# Patient Record
Sex: Female | Born: 1970 | Race: Black or African American | Hispanic: No | Marital: Single | Smoking: Current every day smoker
Health system: Southern US, Community
[De-identification: ages and names within clinical notes are randomized; demographics above are authoritative.]

## PROBLEM LIST (undated history)

## (undated) DIAGNOSIS — F331 Major depressive disorder, recurrent, moderate: Secondary | ICD-10-CM

## (undated) DIAGNOSIS — I872 Venous insufficiency (chronic) (peripheral): Secondary | ICD-10-CM

## (undated) DIAGNOSIS — Z720 Tobacco use: Secondary | ICD-10-CM

## (undated) DIAGNOSIS — I10 Essential (primary) hypertension: Secondary | ICD-10-CM

## (undated) HISTORY — DX: Major depressive disorder, recurrent, moderate: F33.1

## (undated) HISTORY — DX: Essential (primary) hypertension: I10

## (undated) HISTORY — DX: Venous insufficiency (chronic) (peripheral): I87.2

## (undated) HISTORY — DX: Tobacco use: Z72.0

---

## 1997-08-21 ENCOUNTER — Other Ambulatory Visit: Admission: RE | Admit: 1997-08-21 | Discharge: 1997-08-21 | Payer: Self-pay | Admitting: Nephrology

## 1997-09-04 ENCOUNTER — Ambulatory Visit (HOSPITAL_COMMUNITY): Admission: RE | Admit: 1997-09-04 | Discharge: 1997-09-04 | Payer: Self-pay | Admitting: Nephrology

## 1999-05-03 ENCOUNTER — Emergency Department (HOSPITAL_COMMUNITY): Admission: EM | Admit: 1999-05-03 | Discharge: 1999-05-03 | Payer: Self-pay | Admitting: Emergency Medicine

## 1999-05-03 ENCOUNTER — Other Ambulatory Visit: Admission: RE | Admit: 1999-05-03 | Discharge: 1999-05-03 | Payer: Self-pay | Admitting: Obstetrics

## 2000-01-01 ENCOUNTER — Emergency Department (HOSPITAL_COMMUNITY): Admission: EM | Admit: 2000-01-01 | Discharge: 2000-01-01 | Payer: Self-pay | Admitting: Emergency Medicine

## 2000-01-03 ENCOUNTER — Emergency Department (HOSPITAL_COMMUNITY): Admission: EM | Admit: 2000-01-03 | Discharge: 2000-01-03 | Payer: Self-pay | Admitting: Emergency Medicine

## 2000-01-05 ENCOUNTER — Encounter: Admission: RE | Admit: 2000-01-05 | Discharge: 2000-01-05 | Payer: Self-pay | Admitting: Obstetrics & Gynecology

## 2000-08-16 ENCOUNTER — Emergency Department (HOSPITAL_COMMUNITY): Admission: EM | Admit: 2000-08-16 | Discharge: 2000-08-16 | Payer: Self-pay | Admitting: Emergency Medicine

## 2000-08-16 ENCOUNTER — Encounter: Payer: Self-pay | Admitting: Emergency Medicine

## 2001-06-29 ENCOUNTER — Emergency Department (HOSPITAL_COMMUNITY): Admission: EM | Admit: 2001-06-29 | Discharge: 2001-06-29 | Payer: Self-pay | Admitting: Emergency Medicine

## 2002-06-26 ENCOUNTER — Emergency Department (HOSPITAL_COMMUNITY): Admission: EM | Admit: 2002-06-26 | Discharge: 2002-06-26 | Payer: Self-pay | Admitting: Emergency Medicine

## 2002-06-26 ENCOUNTER — Encounter: Payer: Self-pay | Admitting: Emergency Medicine

## 2004-01-08 ENCOUNTER — Emergency Department (HOSPITAL_COMMUNITY): Admission: EM | Admit: 2004-01-08 | Discharge: 2004-01-08 | Payer: Self-pay | Admitting: Family Medicine

## 2004-01-12 ENCOUNTER — Ambulatory Visit: Payer: Self-pay | Admitting: Internal Medicine

## 2004-02-16 ENCOUNTER — Emergency Department (HOSPITAL_COMMUNITY): Admission: EM | Admit: 2004-02-16 | Discharge: 2004-02-17 | Payer: Self-pay | Admitting: Emergency Medicine

## 2004-03-09 ENCOUNTER — Ambulatory Visit: Payer: Self-pay | Admitting: Internal Medicine

## 2004-04-21 ENCOUNTER — Ambulatory Visit: Payer: Self-pay | Admitting: Internal Medicine

## 2004-05-25 ENCOUNTER — Ambulatory Visit: Payer: Self-pay | Admitting: Internal Medicine

## 2004-06-18 ENCOUNTER — Ambulatory Visit: Payer: Self-pay | Admitting: Internal Medicine

## 2004-07-22 ENCOUNTER — Ambulatory Visit: Payer: Self-pay | Admitting: Internal Medicine

## 2004-08-13 ENCOUNTER — Ambulatory Visit: Payer: Self-pay | Admitting: Internal Medicine

## 2004-10-20 ENCOUNTER — Ambulatory Visit: Payer: Self-pay | Admitting: Internal Medicine

## 2005-03-10 ENCOUNTER — Ambulatory Visit: Payer: Self-pay | Admitting: Internal Medicine

## 2005-05-10 ENCOUNTER — Emergency Department (HOSPITAL_COMMUNITY): Admission: EM | Admit: 2005-05-10 | Discharge: 2005-05-10 | Payer: Self-pay | Admitting: Emergency Medicine

## 2005-05-10 IMAGING — CR DG CHEST 2V
2 series · 2 of 2 positions shown · non-contrast
Comparison: [DATE].

CLINICAL DATA: Body aches and hypertension.
CHEST - 2 VIEW:

[w chest pa]
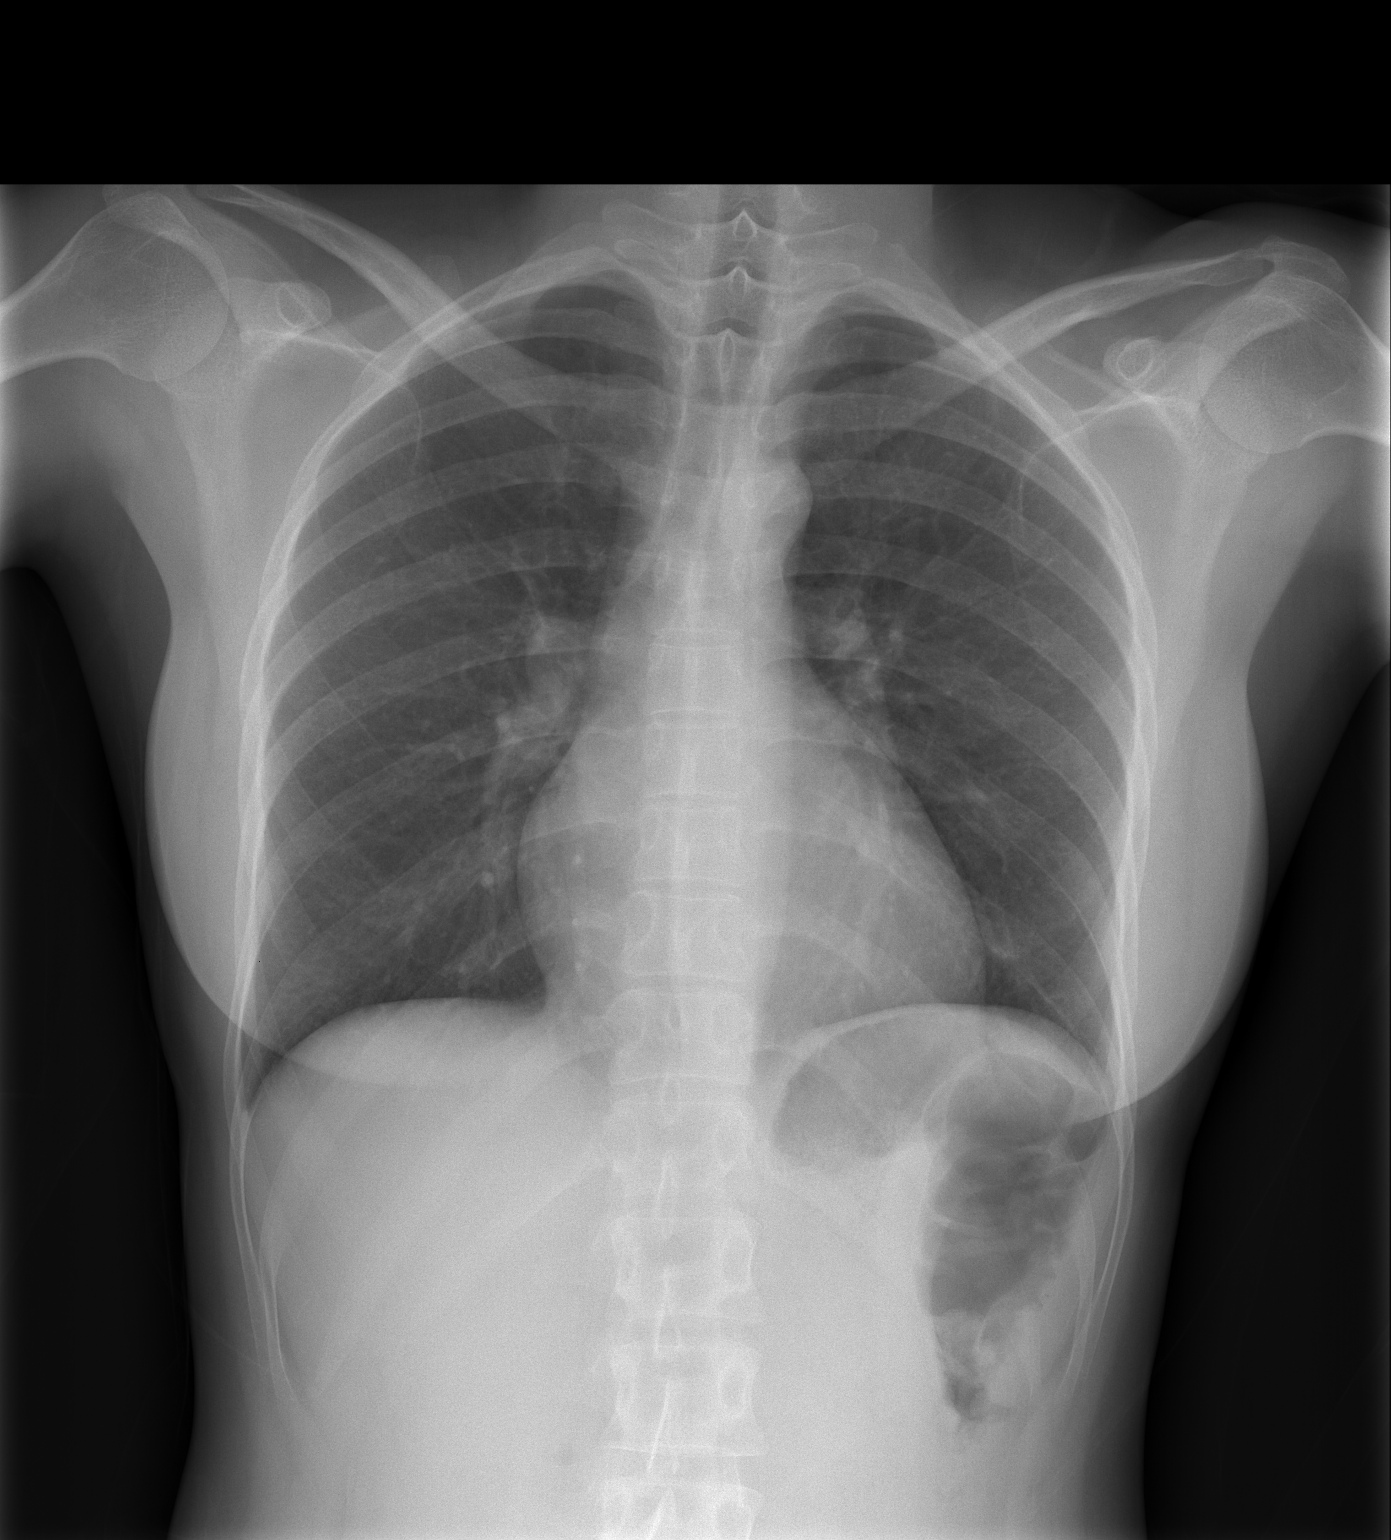

[w chest lat]
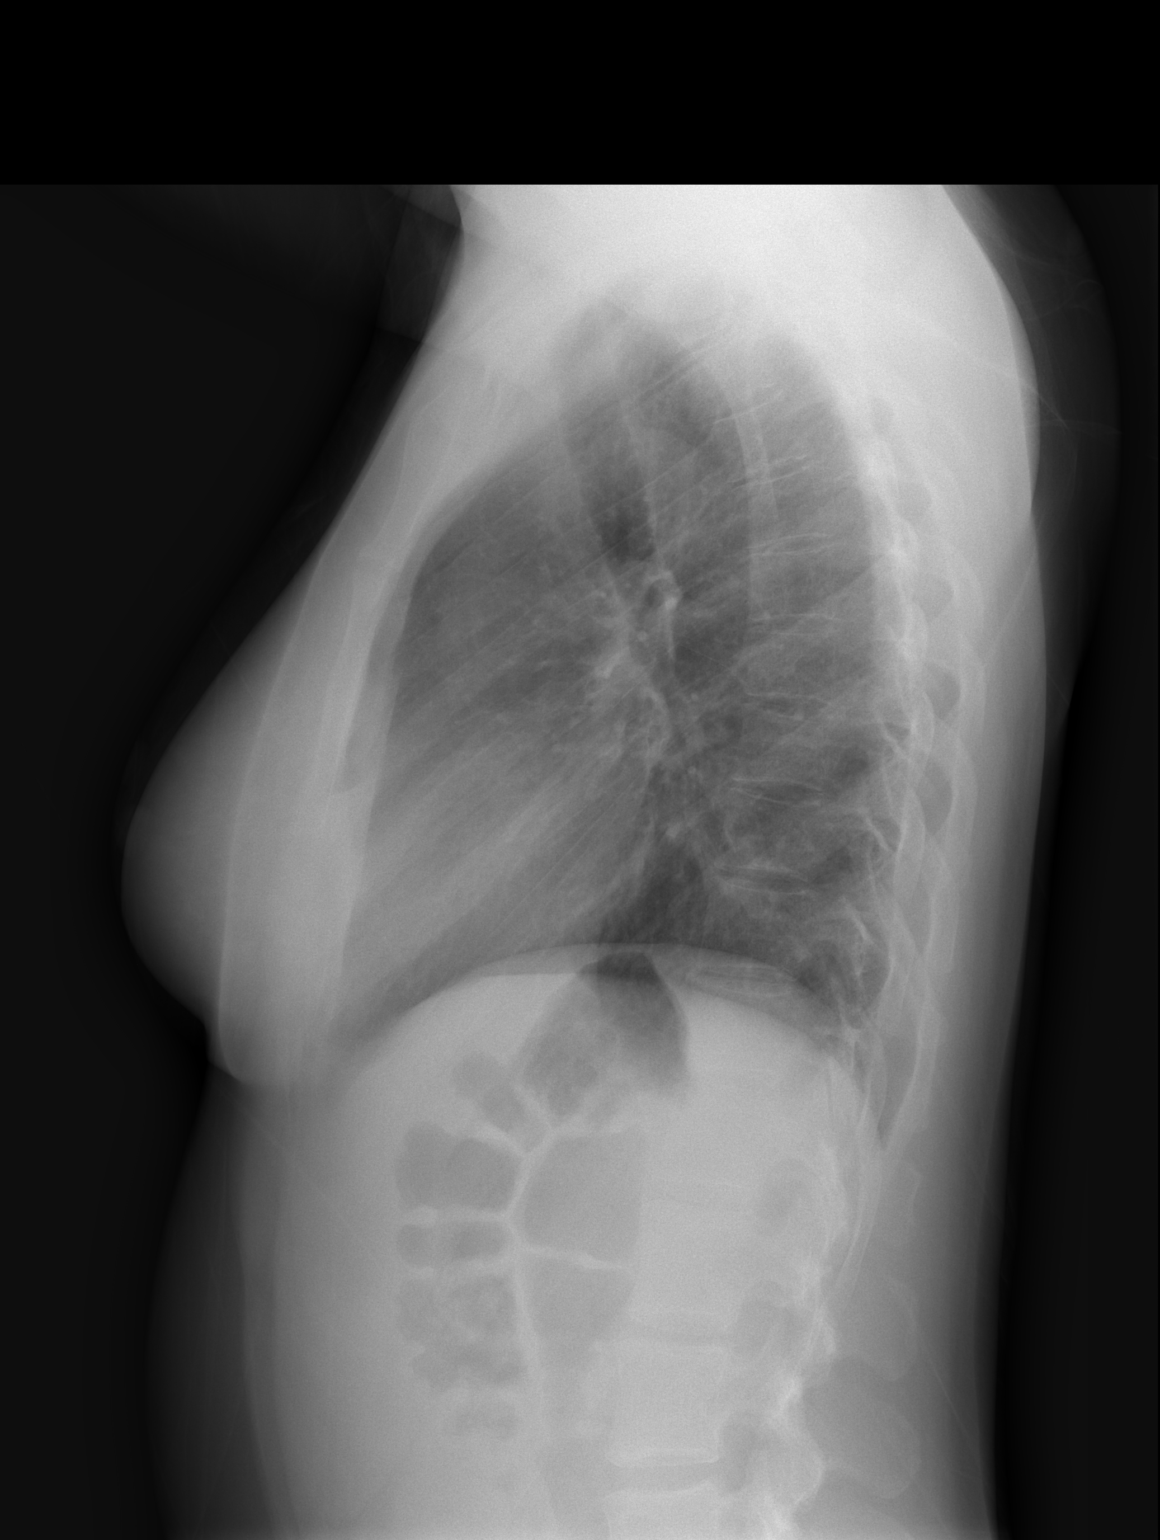

[2 of 2 positions shown; findings below may reference images not displayed]

FINDINGS: lightly prominent appearance of the right hilum unchanged and probably vascular in origin.  No infiltrate or congestive heart failure.  Heart size top normal.
IMPRESSION: 1. No infiltrate or congestive heart failure.
2. Top normal size heart.
3. Prominent appearance right hilum, stable.

## 2006-03-14 DIAGNOSIS — F331 Major depressive disorder, recurrent, moderate: Secondary | ICD-10-CM | POA: Insufficient documentation

## 2006-03-14 DIAGNOSIS — G4700 Insomnia, unspecified: Secondary | ICD-10-CM | POA: Insufficient documentation

## 2006-03-14 DIAGNOSIS — I10 Essential (primary) hypertension: Secondary | ICD-10-CM

## 2006-03-14 DIAGNOSIS — Z720 Tobacco use: Secondary | ICD-10-CM

## 2006-03-14 HISTORY — DX: Tobacco use: Z72.0

## 2006-03-14 HISTORY — DX: Major depressive disorder, recurrent, moderate: F33.1

## 2006-03-14 HISTORY — DX: Essential (primary) hypertension: I10

## 2006-03-21 DIAGNOSIS — K219 Gastro-esophageal reflux disease without esophagitis: Secondary | ICD-10-CM | POA: Insufficient documentation

## 2006-07-13 ENCOUNTER — Encounter (INDEPENDENT_AMBULATORY_CARE_PROVIDER_SITE_OTHER): Payer: Self-pay | Admitting: Internal Medicine

## 2006-07-13 ENCOUNTER — Ambulatory Visit: Payer: Self-pay | Admitting: Internal Medicine

## 2006-07-13 DIAGNOSIS — R300 Dysuria: Secondary | ICD-10-CM | POA: Insufficient documentation

## 2006-07-13 LAB — CONVERTED CEMR LAB
Amphetamine Screen, Ur: NEGATIVE
Barbiturate Quant, Ur: NEGATIVE
Benzodiazepines.: NEGATIVE
Bilirubin Urine: NEGATIVE
Cocaine Metabolites: NEGATIVE
Creatinine,U: 41.3 mg/dL
Hemoglobin, Urine: NEGATIVE
Ketones, ur: NEGATIVE mg/dL
Leukocytes, UA: NEGATIVE
Marijuana Metabolite: NEGATIVE
Methadone: NEGATIVE
Nitrite: NEGATIVE
Opiates: NEGATIVE
Phencyclidine (PCP): NEGATIVE
Propoxyphene: NEGATIVE
Protein, ur: NEGATIVE mg/dL
Specific Gravity, Urine: 1.008 (ref 1.005–1.03)
Urine Glucose: NEGATIVE mg/dL
Urobilinogen, UA: 0.2 (ref 0.0–1.0)
pH: 6 (ref 5.0–8.0)

## 2007-06-26 ENCOUNTER — Encounter: Payer: Self-pay | Admitting: Internal Medicine

## 2007-06-26 ENCOUNTER — Ambulatory Visit: Payer: Self-pay | Admitting: Internal Medicine

## 2007-06-29 LAB — CONVERTED CEMR LAB
ALT: 8 units/L (ref 0–35)
ANA Titer 1: 1:40 {titer} — ABNORMAL HIGH
AST: 16 units/L (ref 0–37)
Albumin: 4.5 g/dL (ref 3.5–5.2)
Alkaline Phosphatase: 64 units/L (ref 39–117)
Anti Nuclear Antibody(ANA): POSITIVE — AB
BUN: 9 mg/dL (ref 6–23)
Basophils Absolute: 0 10*3/uL (ref 0.0–0.1)
Basophils Relative: 0 % (ref 0–1)
CO2: 22 meq/L (ref 19–32)
Calcium: 9.4 mg/dL (ref 8.4–10.5)
Chloride: 108 meq/L (ref 96–112)
Creatinine, Ser: 0.76 mg/dL (ref 0.40–1.20)
Eosinophils Absolute: 0.2 10*3/uL (ref 0.0–0.7)
Eosinophils Relative: 2 % (ref 0–5)
Glucose, Bld: 89 mg/dL (ref 70–99)
HCT: 43.2 % (ref 36.0–46.0)
Hemoglobin: 14 g/dL (ref 12.0–15.0)
Lymphocytes Relative: 33 % (ref 12–46)
Lymphs Abs: 3.6 10*3/uL (ref 0.7–4.0)
MCHC: 32.4 g/dL (ref 30.0–36.0)
MCV: 79.1 fL (ref 78.0–100.0)
Monocytes Absolute: 0.9 10*3/uL (ref 0.1–1.0)
Monocytes Relative: 9 % (ref 3–12)
Neutro Abs: 6.1 10*3/uL (ref 1.7–7.7)
Neutrophils Relative %: 56 % (ref 43–77)
Platelets: 301 10*3/uL (ref 150–400)
Potassium: 4 meq/L (ref 3.5–5.3)
RBC: 5.46 M/uL — ABNORMAL HIGH (ref 3.87–5.11)
RDW: 14 % (ref 11.5–15.5)
Sed Rate: 2 mm/hr (ref 0–22)
Sodium: 141 meq/L (ref 135–145)
TSH: 0.649 microintl units/mL (ref 0.350–5.50)
Total Bilirubin: 0.3 mg/dL (ref 0.3–1.2)
Total Protein: 7.8 g/dL (ref 6.0–8.3)
WBC: 10.9 10*3/uL — ABNORMAL HIGH (ref 4.0–10.5)

## 2007-07-03 ENCOUNTER — Encounter: Payer: Self-pay | Admitting: Internal Medicine

## 2007-07-03 ENCOUNTER — Ambulatory Visit (HOSPITAL_COMMUNITY): Admission: RE | Admit: 2007-07-03 | Discharge: 2007-07-03 | Payer: Self-pay | Admitting: Hospitalist

## 2007-07-03 ENCOUNTER — Ambulatory Visit: Payer: Self-pay | Admitting: Hospitalist

## 2007-07-03 DIAGNOSIS — I498 Other specified cardiac arrhythmias: Secondary | ICD-10-CM | POA: Insufficient documentation

## 2007-07-03 DIAGNOSIS — H1030 Unspecified acute conjunctivitis, unspecified eye: Secondary | ICD-10-CM | POA: Insufficient documentation

## 2007-07-04 LAB — CONVERTED CEMR LAB
ENA SM Ab Ser-aCnc: 0.2 (ref <1.0)
ds DNA Ab: <1 (ref <5)

## 2007-08-09 ENCOUNTER — Ambulatory Visit: Payer: Self-pay | Admitting: *Deleted

## 2007-08-09 ENCOUNTER — Ambulatory Visit (HOSPITAL_COMMUNITY): Admission: RE | Admit: 2007-08-09 | Discharge: 2007-08-09 | Payer: Self-pay | Admitting: *Deleted

## 2007-08-09 DIAGNOSIS — R0602 Shortness of breath: Secondary | ICD-10-CM | POA: Insufficient documentation

## 2007-08-09 IMAGING — CR DG CHEST 2V
2 series · 2 of 2 positions shown · non-contrast
Comparison: [DATE] study

CLINICAL DATA: Tachycardia.  Dyspnea on exertion.  Hypertension.
History of tobacco smoking.

CHEST - 2 VIEW

[w chest pa]
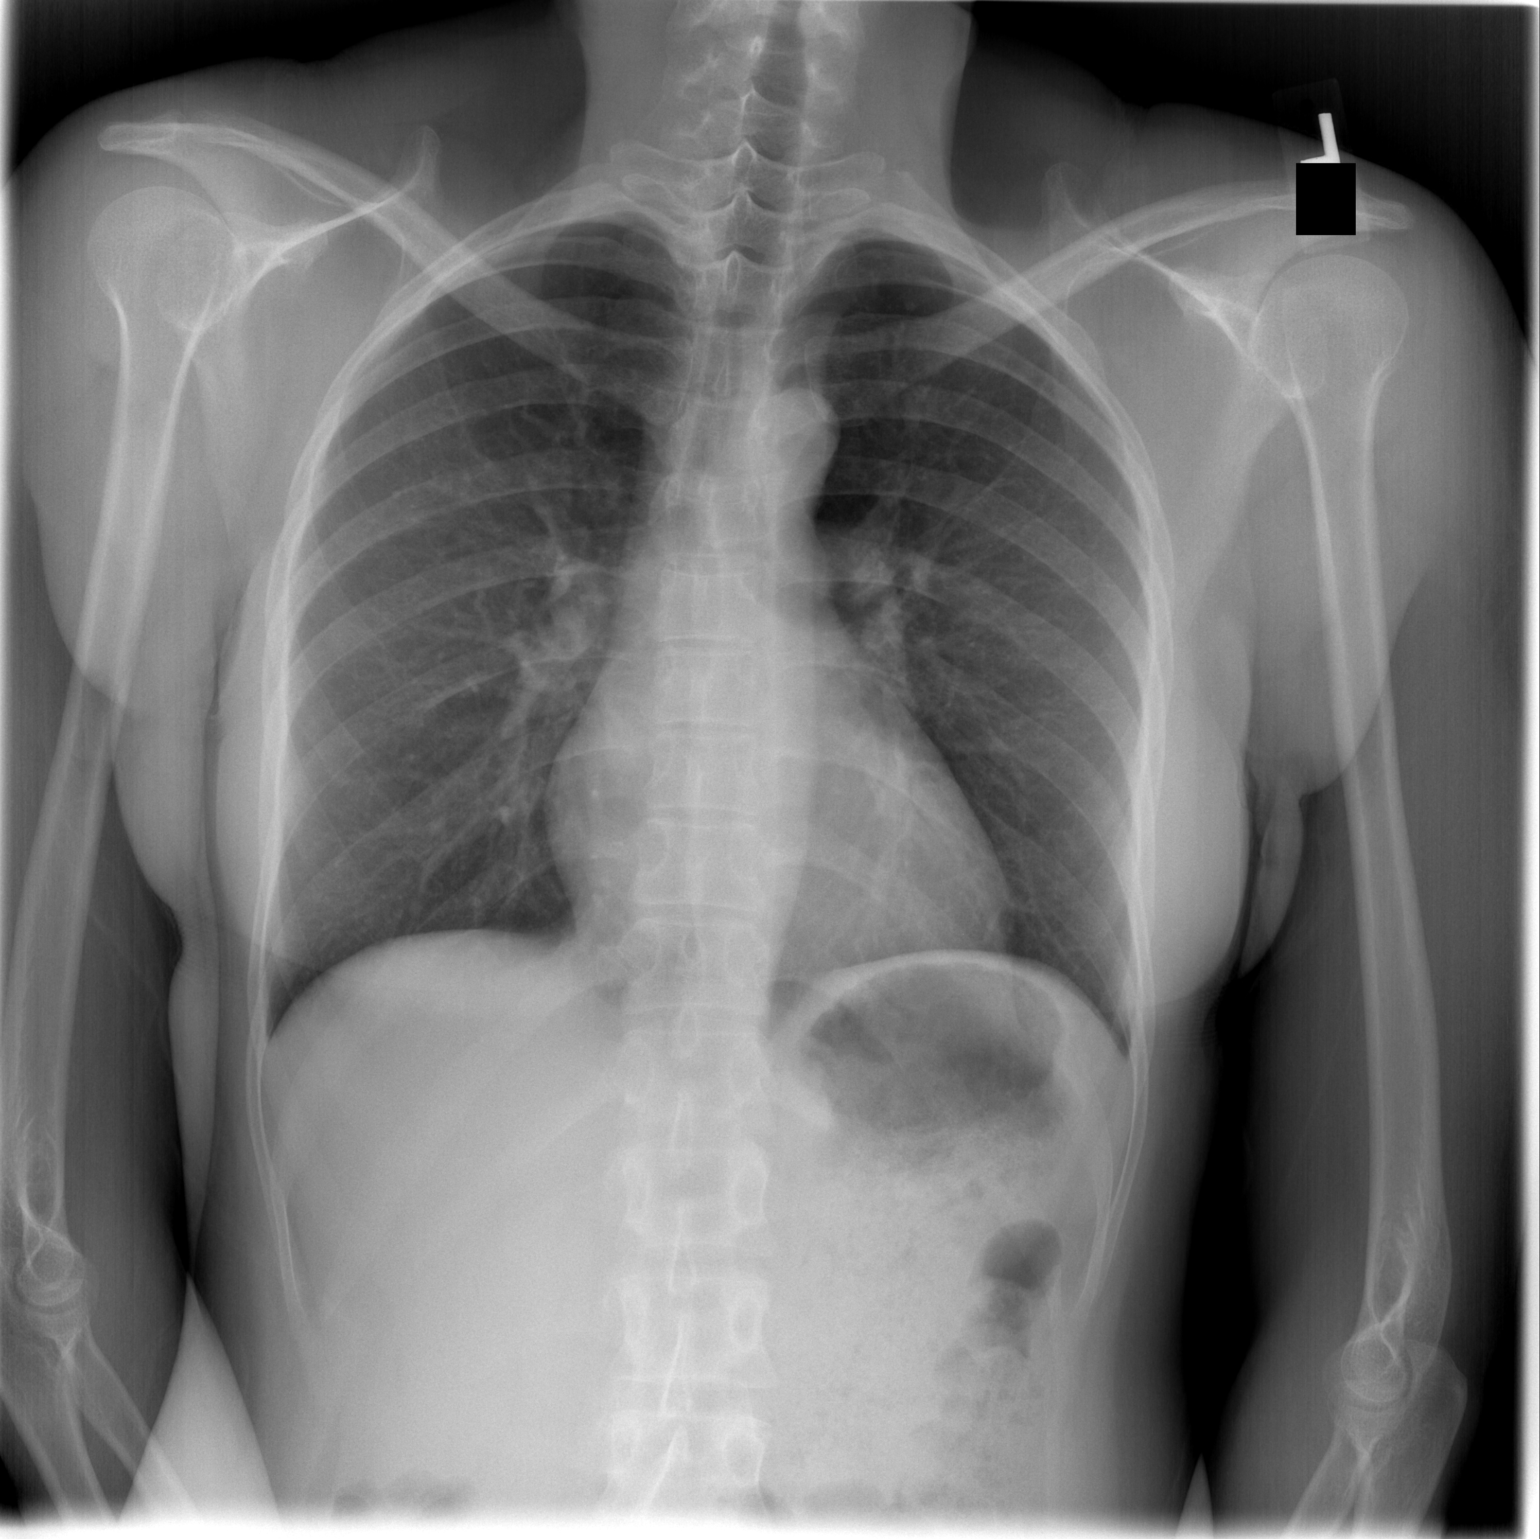

[w chest lat]
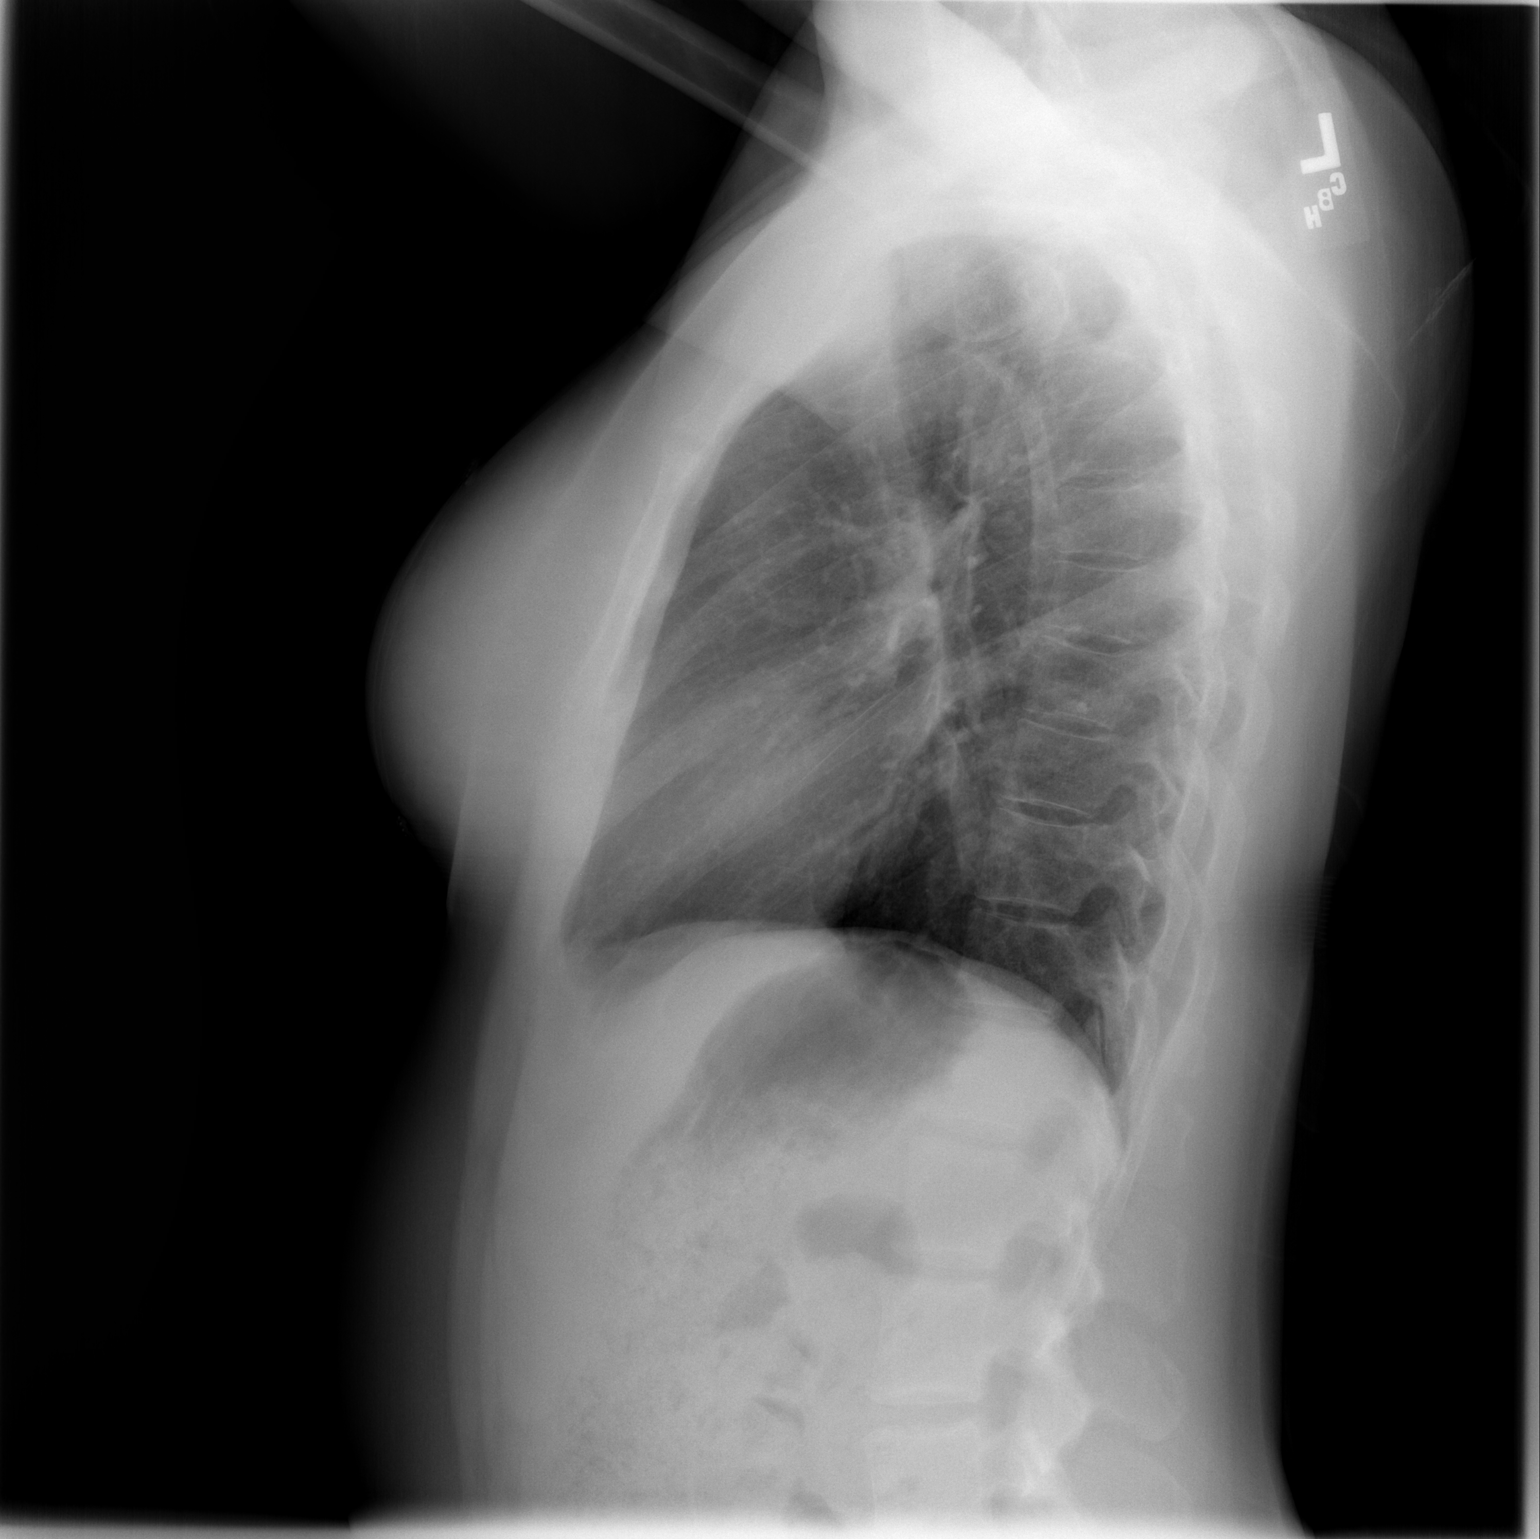

[2 of 2 positions shown; findings below may reference images not displayed]

FINDINGS: Cardiac silhouette is upper normal size.  No mediastinal
or pleural lesions are seen.  Lungs are free of infiltrates.  No
bony lesions are seen.
IMPRESSION: No acute or active process seen.

## 2007-08-15 ENCOUNTER — Ambulatory Visit (HOSPITAL_COMMUNITY): Admission: RE | Admit: 2007-08-15 | Discharge: 2007-08-15 | Payer: Self-pay | Admitting: *Deleted

## 2007-08-15 ENCOUNTER — Encounter (INDEPENDENT_AMBULATORY_CARE_PROVIDER_SITE_OTHER): Payer: Self-pay | Admitting: *Deleted

## 2007-08-15 ENCOUNTER — Ambulatory Visit: Payer: Self-pay | Admitting: Internal Medicine

## 2007-09-19 ENCOUNTER — Ambulatory Visit: Payer: Self-pay | Admitting: *Deleted

## 2008-01-07 ENCOUNTER — Ambulatory Visit: Payer: Self-pay | Admitting: Internal Medicine

## 2008-01-07 DIAGNOSIS — K089 Disorder of teeth and supporting structures, unspecified: Secondary | ICD-10-CM | POA: Insufficient documentation

## 2008-01-08 ENCOUNTER — Encounter (INDEPENDENT_AMBULATORY_CARE_PROVIDER_SITE_OTHER): Payer: Self-pay | Admitting: *Deleted

## 2008-01-22 ENCOUNTER — Telehealth: Payer: Self-pay | Admitting: *Deleted

## 2008-01-23 ENCOUNTER — Telehealth: Payer: Self-pay | Admitting: *Deleted

## 2008-04-11 ENCOUNTER — Ambulatory Visit: Payer: Self-pay | Admitting: Internal Medicine

## 2008-04-11 ENCOUNTER — Encounter (INDEPENDENT_AMBULATORY_CARE_PROVIDER_SITE_OTHER): Payer: Self-pay | Admitting: Internal Medicine

## 2008-04-11 DIAGNOSIS — H00019 Hordeolum externum unspecified eye, unspecified eyelid: Secondary | ICD-10-CM | POA: Insufficient documentation

## 2008-04-11 LAB — CONVERTED CEMR LAB
BUN: 7 mg/dL (ref 6–23)
CO2: 21 meq/L (ref 19–32)
Calcium: 9 mg/dL (ref 8.4–10.5)
Chloride: 105 meq/L (ref 96–112)
Creatinine, Ser: 0.64 mg/dL (ref 0.40–1.20)
Glucose, Bld: 99 mg/dL (ref 70–99)
Potassium: 3.8 meq/L (ref 3.5–5.3)
Sodium: 140 meq/L (ref 135–145)

## 2008-07-27 ENCOUNTER — Emergency Department (HOSPITAL_COMMUNITY): Admission: EM | Admit: 2008-07-27 | Discharge: 2008-07-27 | Payer: Self-pay | Admitting: Emergency Medicine

## 2008-09-09 ENCOUNTER — Emergency Department (HOSPITAL_COMMUNITY): Admission: EM | Admit: 2008-09-09 | Discharge: 2008-09-09 | Payer: Self-pay | Admitting: Emergency Medicine

## 2008-11-26 ENCOUNTER — Ambulatory Visit: Payer: Self-pay | Admitting: Internal Medicine

## 2010-08-20 ENCOUNTER — Encounter: Payer: Self-pay | Admitting: Internal Medicine

## 2012-01-07 ENCOUNTER — Emergency Department (HOSPITAL_COMMUNITY)
Admission: EM | Admit: 2012-01-07 | Discharge: 2012-01-07 | Disposition: A | Discharge status: Home / Self Care | Payer: Managed Care, Other (non HMO) | Source: Home / Self Care | Attending: Emergency Medicine | Admitting: Emergency Medicine

## 2012-01-07 ENCOUNTER — Encounter (HOSPITAL_COMMUNITY): Payer: Self-pay | Admitting: Emergency Medicine

## 2012-01-07 DIAGNOSIS — S0181XA Laceration without foreign body of other part of head, initial encounter: Secondary | ICD-10-CM

## 2012-01-07 DIAGNOSIS — S0180XA Unspecified open wound of other part of head, initial encounter: Secondary | ICD-10-CM

## 2012-01-07 MED ORDER — BACITRACIN ZINC 500 UNIT/GM EX OINT: TOPICAL_OINTMENT | Freq: Two times a day (BID) | CUTANEOUS | Status: DC

## 2012-01-07 NOTE — ED Provider Notes (Addendum)
History     CSN: [id number]  Arrival date & time 01/07/12  1257   First MD Initiated Contact with Patient 01/07/12 1358      Chief Complaint  Patient presents with  . Facial Laceration    (Consider location/radiation/quality/duration/timing/severity/associated sxs/prior treatment) HPI Comments: Pt states that she fell getting out of bathtub around 1 p.m today, causing a laceration to the left side of her face along side her left eye. Pt is very tearful, denies abuse. Pt states that she has a hx of depression, meds were prescribed but too expensive. "  I just slipped and fell hitting something around me on my face". I did not passed out"  Patient is a 41 y.o. female presenting with facial injury. The history is provided by the patient.  Facial Injury  The incident occurred just prior to arrival. The injury mechanism was a fall and a direct blow. There is an injury to the face. The pain is moderate. There is no possibility that she inhaled smoke. Associated symptoms include difficulty breathing. Pertinent negatives include no chest pain, no numbness, no visual disturbance, no bladder incontinence, no headaches, no light-headedness, no weakness and no memory loss.    Past Medical History  Diagnosis Date  . Depression   . Hypertension   . GERD (gastroesophageal reflux disease)   . Insomnia   . Headache   . Tobacco user     History reviewed. No pertinent past surgical history.  History reviewed. No pertinent family history.  History  Substance Use Topics  . Smoking status: Current Every Day Smoker -- 2.0 packs/day  . Smokeless tobacco: Not on file   Comment: ready to quit  . Alcohol Use: Yes     occasional    OB History    Grav Para Term Preterm Abortions TAB SAB Ect Mult Living                  Review of Systems  Constitutional: Positive for activity change.  Eyes: Negative for photophobia, pain and visual disturbance.  Cardiovascular: Negative for chest pain.    Genitourinary: Negative for bladder incontinence.  Skin: Positive for wound.  Neurological: Negative for dizziness, weakness, light-headedness, numbness and headaches.  Psychiatric/Behavioral: Negative for suicidal ideas, memory loss and disturbed wake/sleep cycle. The patient is nervous/anxious.     Allergies  Penicillins and Sulfonamide derivatives  Home Medications   Current Outpatient Rx  Name Route Sig Dispense Refill  . ATENOLOL 25 MG PO TABS Oral Take 25 mg by mouth daily.      Marland Kitchen CITALOPRAM HYDROBROMIDE 20 MG PO TABS Oral Take 20 mg by mouth daily.      Marland Kitchen HYDROCHLOROTHIAZIDE 25 MG PO TABS Oral Take 25 mg by mouth daily.      Marland Kitchen LANSOPRAZOLE 15 MG PO CPDR Oral Take 30 mg by mouth daily.      Marland Kitchen LISINOPRIL 40 MG PO TABS Oral Take 40 mg by mouth daily.      Marland Kitchen DEPO-PROVERA IM Intramuscular Inject into the muscle. Dose unknown as administered by health department.     Marland Kitchen MIRTAZAPINE 15 MG PO TABS Oral Take 15 mg by mouth at bedtime.      Jeananne Rama SULFATE 0.05-0.25 % OP SOLN  1 drop 3 (three) times daily as needed. For 4 days     . BACITRACIN ZINC 500 UNIT/GM EX OINT Topical Apply topically 2 (two) times daily. Apply bid x 7 days 120 g 0    BP 163/107  Pulse 83  Temp 98.9 F (37.2 C) (Oral)  Resp 21  SpO2 97%  Physical Exam  Nursing note and vitals reviewed. Constitutional: She is oriented to person, place, and time. She appears distressed.  HENT:  Head: Head is with laceration. Head is without Battle's sign, without abrasion, without right periorbital erythema and without left periorbital erythema.    Eyes: Conjunctivae normal, EOM and lids are normal. Pupils are equal, round, and reactive to light. No scleral icterus.  Neurological: She is alert and oriented to person, place, and time. No cranial nerve deficit. She exhibits normal muscle tone.  Skin: Skin is warm.    ED Course  LACERATION REPAIR Performed by: Devean Skoczylas Authorized by: Jimmie Molly Consent: Verbal consent obtained. Written consent not obtained. Risks and benefits: risks, benefits and alternatives were discussed Consent given by: patient Patient understanding: patient states understanding of the procedure being performed Body area: head/neck Location details: forehead Tendon involvement: none Nerve involvement: none Vascular damage: no Anesthesia: local infiltration Local anesthetic: lidocaine 1% with epinephrine Anesthetic total: 4 ml Amount of cleaning: standard Debridement: none Degree of undermining: none Skin closure: 5-0 Prolene Number of sutures: 8 Technique: simple Dressing: 4x4 sterile gauze and antibiotic ointment Patient tolerance: Patient tolerated the procedure well with no immediate complications.   (including critical care time)  Labs Reviewed - No data to display No results found.   1. Facial laceration       MDM  Facial laceration and contusion. Patient was interviewed on several locations as she had some emotional lability to screen for potential domestic abuse which she constantly denied.        Jimmie Molly, MD 01/07/12 2029  Jimmie Molly, MD 01/08/12 [phone]

## 2012-01-07 NOTE — ED Notes (Signed)
Pt states that she fell getting out of tube around 1 p.m today causing a laceration to the left side of her face along side her left eye. Pt is very tearful, denies abuse. Pt states that she has a hx of depression, meds were prescribed but too expensive. Pt states that she does have an up coming appt with Dr. Parke Simmers for a cheaper med.

## 2012-01-14 ENCOUNTER — Encounter (HOSPITAL_COMMUNITY): Payer: Self-pay | Admitting: Emergency Medicine

## 2012-01-14 ENCOUNTER — Emergency Department (INDEPENDENT_AMBULATORY_CARE_PROVIDER_SITE_OTHER)
Admission: EM | Admit: 2012-01-14 | Discharge: 2012-01-14 | Disposition: A | Discharge status: Home / Self Care | Payer: Managed Care, Other (non HMO) | Source: Home / Self Care | Attending: Emergency Medicine | Admitting: Emergency Medicine

## 2012-01-14 DIAGNOSIS — T148XXA Other injury of unspecified body region, initial encounter: Secondary | ICD-10-CM

## 2012-01-14 DIAGNOSIS — IMO0002 Reserved for concepts with insufficient information to code with codable children: Secondary | ICD-10-CM

## 2012-01-14 NOTE — ED Notes (Signed)
Pt here for suture removal. BP is elevated 154/98 pt is c/o of headache. Pt states that she did take BP med this a.m.

## 2012-01-14 NOTE — ED Provider Notes (Signed)
Chief Complaint  Patient presents with  . Suture / Staple Removal    History of Present Illness:  The patient is a 41 year old female who sustained a laceration over the left lateral orbital week ago. She fell but there was no loss of consciousness. This was repaired with a total of 8 sutures, 6 in the upper portion the laceration into the lower portion the laceration. She returns today for suture removal. She's been taking good care of this, and there is no evidence of infection.  Review of Systems:  Other than noted above, the patient denies any of the following symptoms: Systemic:  No fever or chills. Eye:  No eye pain, redness, diplopia or blurred vision ENT:  No bleeding from nose or ears.  No loose or broken teeth. Neck:  No pain or limited ROM. GI:  No nausea or vomiting. Neuro:  No loss of consciousness, seizure activity, numbness, tingling, or weakness.  PMFSH:  Past medical history, family history, social history, meds, and allergies were reviewed.  Physical Exam:   Vital signs:  BP 154/98  Pulse 77  Temp 98.3 F (36.8 C) (Oral)  Resp 16  SpO2 98% General:  Alert and oriented times 3.  In no distress. Eye:  PERRL, full EOMs.  Lids and conjunctivas normal. HEENT:  She has a laceration over the left lateral orbital rim, and this appears to be healing well without any evidence of infection.   Neck:  Non tender.  Full ROM without pain. Neurological:  Alert and oriented.  Cranial nerves intact.    Procedure: Verbal informed consent was obtained.  The patient was informed of the risks and benefits of the procedure and understands and accepts.  Identity of the patient was verified verbally and by wristband.   I started to remove the stitches since she was a week out from the laceration repair. I removed the top 3 stitches and there was some wound separation and gaping of the wound edges. At that point I left the other 3 Stitches in place and remove the bottom 2 where the wound had  healed well. Benzoin and Steri-Strips were applied to reapproximate the wound edges at the upper portion of the laceration. She was instructed in wound care and will return in 4-5 more days for recheck and possible removal of the remaining 3 stitches.   Assessment:  The encounter diagnosis was Laceration.  Plan:   1.  The following meds were prescribed:   New Prescriptions   No medications on file   2.  The patient was instructed in wound care and pain control, and handouts were given. 3.  The patient was told to return in 4-5 days for suture removal or wound recheck or sooner if any sign of infection.    Reuben Likes, MD 01/14/12 1030

## 2012-01-19 ENCOUNTER — Emergency Department (INDEPENDENT_AMBULATORY_CARE_PROVIDER_SITE_OTHER)
Admission: EM | Admit: 2012-01-19 | Discharge: 2012-01-19 | Disposition: A | Discharge status: Home / Self Care | Payer: Managed Care, Other (non HMO) | Source: Home / Self Care

## 2012-01-19 ENCOUNTER — Encounter (HOSPITAL_COMMUNITY): Payer: Self-pay | Admitting: *Deleted

## 2012-01-19 DIAGNOSIS — Z4802 Encounter for removal of sutures: Secondary | ICD-10-CM

## 2012-01-19 NOTE — ED Notes (Signed)
3 sutures removed from left side of forehead and 3 steri strips - no s/s of infection noted

## 2012-01-19 NOTE — ED Notes (Signed)
Removal of sutures

## 2012-01-19 NOTE — ED Provider Notes (Signed)
History     CSN: [id number]  Arrival date & time 01/19/12  1652   None     Chief Complaint  Patient presents with  . Suture / Staple Removal    (Consider location/radiation/quality/duration/timing/severity/associated sxs/prior treatment) HPI Comments: Careful removal of the last 3 sutures as instructed by Dr.Keller. When I walk in the room to see the patient she states that another woman had removed the sutures just prior to my arrival. The wound is healing well there is no signs of infection there is approximately 0.5 mm at the most superior and of the wooden pedis to slightly open but should heal together quite well. No further treatment is needed at this point.  Patient is a 41 y.o. female presenting with suture removal.  Suture / Staple Removal     Past Medical History  Diagnosis Date  . Depression   . Hypertension   . GERD (gastroesophageal reflux disease)   . Insomnia   . Headache   . Tobacco user     History reviewed. No pertinent past surgical history.  Family History  Problem Relation Age of Onset  . Family history unknown: Yes    History  Substance Use Topics  . Smoking status: Current Every Day Smoker -- 2.0 packs/day  . Smokeless tobacco: Not on file   Comment: ready to quit  . Alcohol Use: Yes     occasional    OB History    Grav Para Term Preterm Abortions TAB SAB Ect Mult Living                  Review of Systems  Constitutional: Negative for activity change.  HENT: Negative for facial swelling.   Skin: Negative.     Allergies  Penicillins and Sulfonamide derivatives  Home Medications   Current Outpatient Rx  Name Route Sig Dispense Refill  . ATENOLOL 25 MG PO TABS Oral Take 25 mg by mouth daily.      Marland Kitchen BACITRACIN ZINC 500 UNIT/GM EX OINT Topical Apply topically 2 (two) times daily. Apply bid x 7 days 120 g 0  . CITALOPRAM HYDROBROMIDE 20 MG PO TABS Oral Take 20 mg by mouth daily.      Marland Kitchen HYDROCHLOROTHIAZIDE 25 MG PO TABS Oral  Take 25 mg by mouth daily.      Marland Kitchen LANSOPRAZOLE 15 MG PO CPDR Oral Take 30 mg by mouth daily.      Marland Kitchen LISINOPRIL 40 MG PO TABS Oral Take 40 mg by mouth daily.      Marland Kitchen DEPO-PROVERA IM Intramuscular Inject into the muscle. Dose unknown as administered by health department.     Marland Kitchen MIRTAZAPINE 15 MG PO TABS Oral Take 15 mg by mouth at bedtime.      Jeananne Rama SULFATE 0.05-0.25 % OP SOLN  1 drop 3 (three) times daily as needed. For 4 days       BP 145/91  Pulse 84  Temp 98.4 F (36.9 C) (Oral)  Resp 24  SpO2 99%  Physical Exam  Constitutional: She is oriented to person, place, and time. She appears well-developed and well-nourished.  Eyes: EOM are normal.  Neck: Neck supple.  Neurological: She is alert and oriented to person, place, and time.  Skin: Skin is warm and dry. No rash noted. No erythema. No pallor.       Wound edges well approximated and healing well no signs of infection    ED Course  Procedures (including critical care time)  Labs Reviewed -  No data to display No results found.   1. Visit for suture removal       MDM  Work note stating that the patient was here. No further treatment necessary, may return for any new symptoms problems or worsening Advised patient not to rub the area hard as a when washing the face for another 3-4 days.        Hayden Rasmussen, NP 01/19/12 1820

## 2012-01-20 NOTE — ED Provider Notes (Signed)
Medical screening examination/treatment/procedure(s) were performed by non-physician practitioner and as supervising physician I was immediately available for consultation/collaboration.  Leslee Home, M.D.   Reuben Likes, MD 01/20/12 [phone]

## 2012-05-09 ENCOUNTER — Other Ambulatory Visit: Payer: Self-pay | Admitting: Obstetrics and Gynecology

## 2012-05-09 DIAGNOSIS — R928 Other abnormal and inconclusive findings on diagnostic imaging of breast: Secondary | ICD-10-CM

## 2012-05-24 ENCOUNTER — Ambulatory Visit
Admission: RE | Admit: 2012-05-24 | Discharge: 2012-05-24 | Disposition: A | Discharge status: Home / Self Care | Payer: 59 | Source: Ambulatory Visit | Attending: Obstetrics and Gynecology | Admitting: Obstetrics and Gynecology

## 2012-05-24 DIAGNOSIS — R928 Other abnormal and inconclusive findings on diagnostic imaging of breast: Secondary | ICD-10-CM

## 2012-05-25 IMAGING — MG MM DIGITAL DIAGNOSTIC UNILAT*R*
3 series · 3 of 3 positions shown · non-contrast
Comparison: [DATE]

CLINICAL DATA: Called back from screening mammography, right
breast calcifications.

DIGITAL DIAGNOSTIC RIGHT MAMMOGRAM

[R CC (1 of 2)]
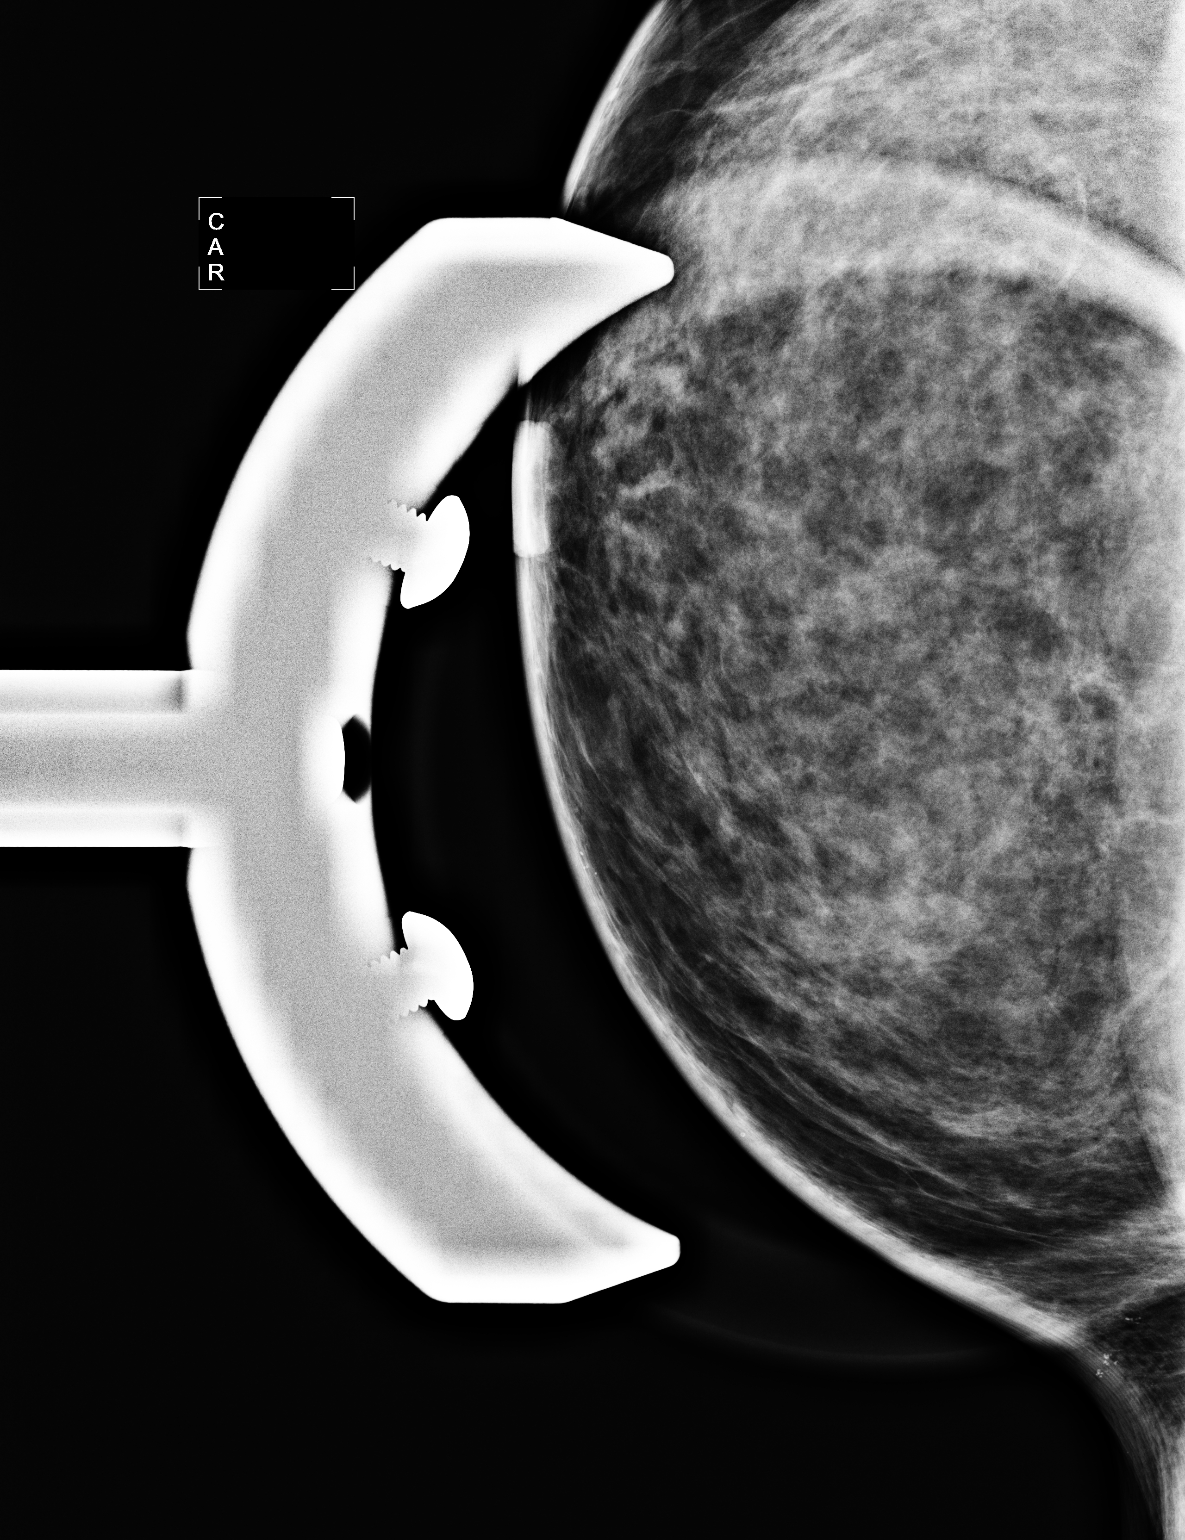

[R ML]
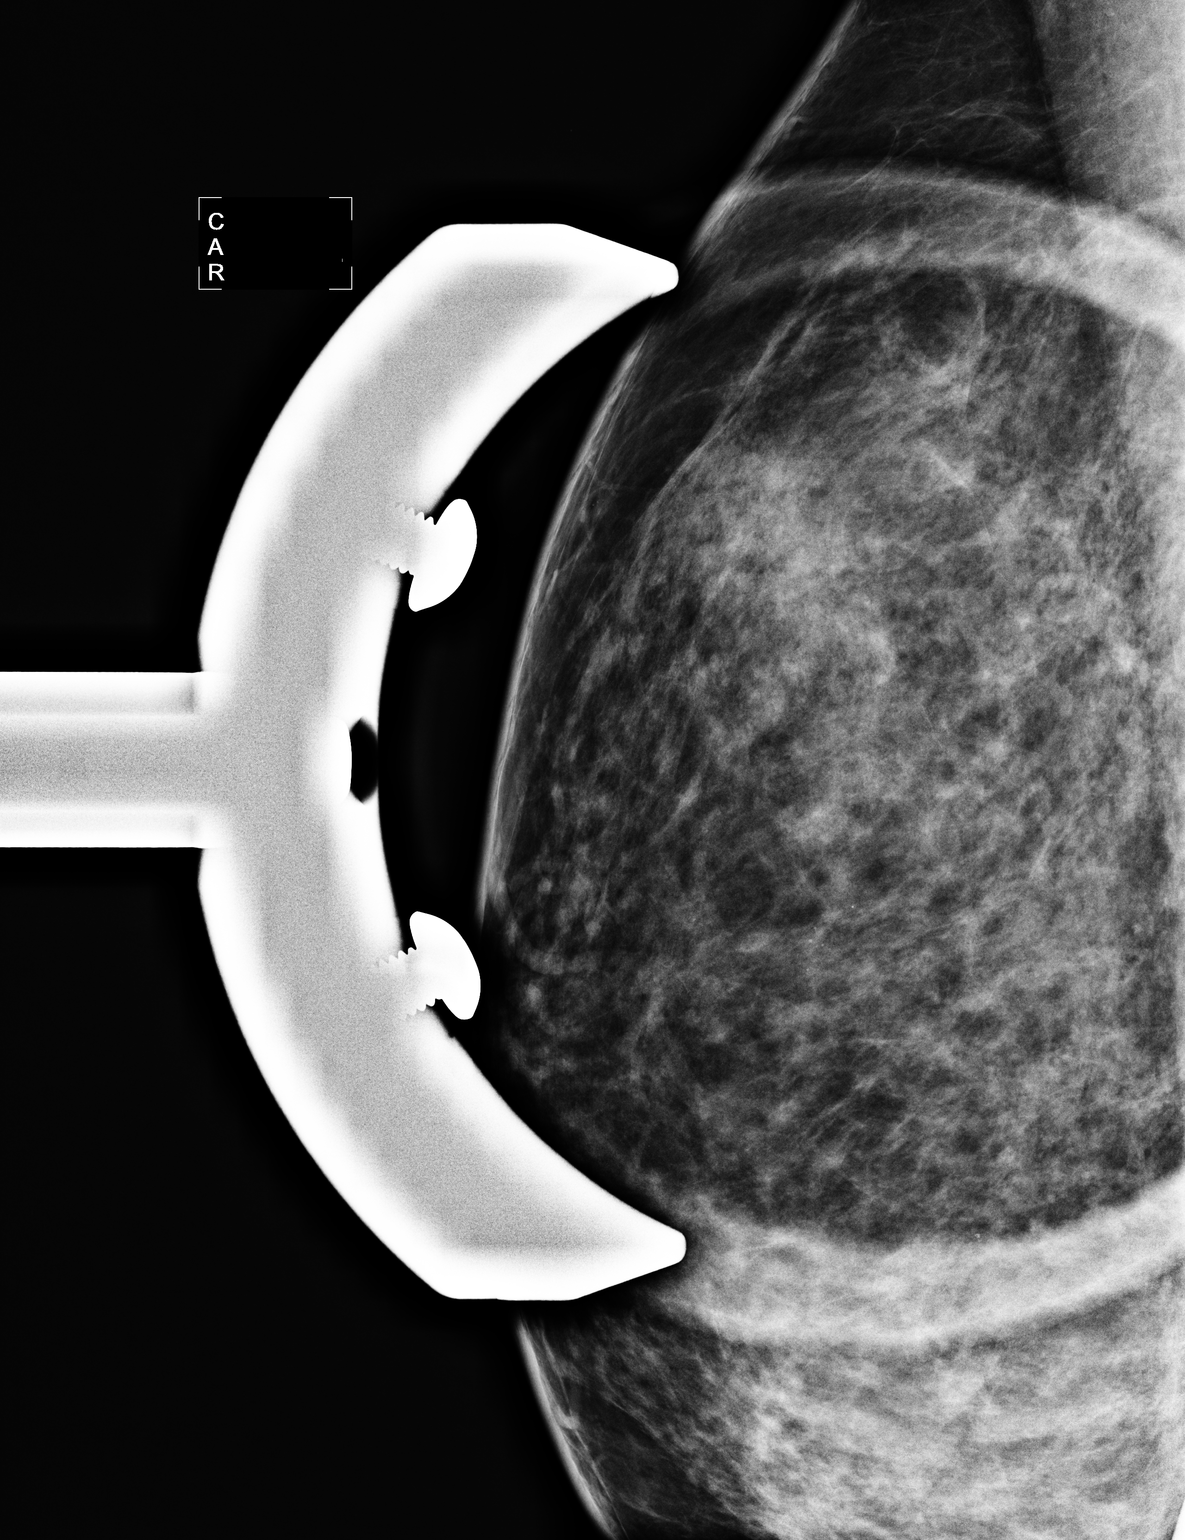

[R CC (2 of 2)]
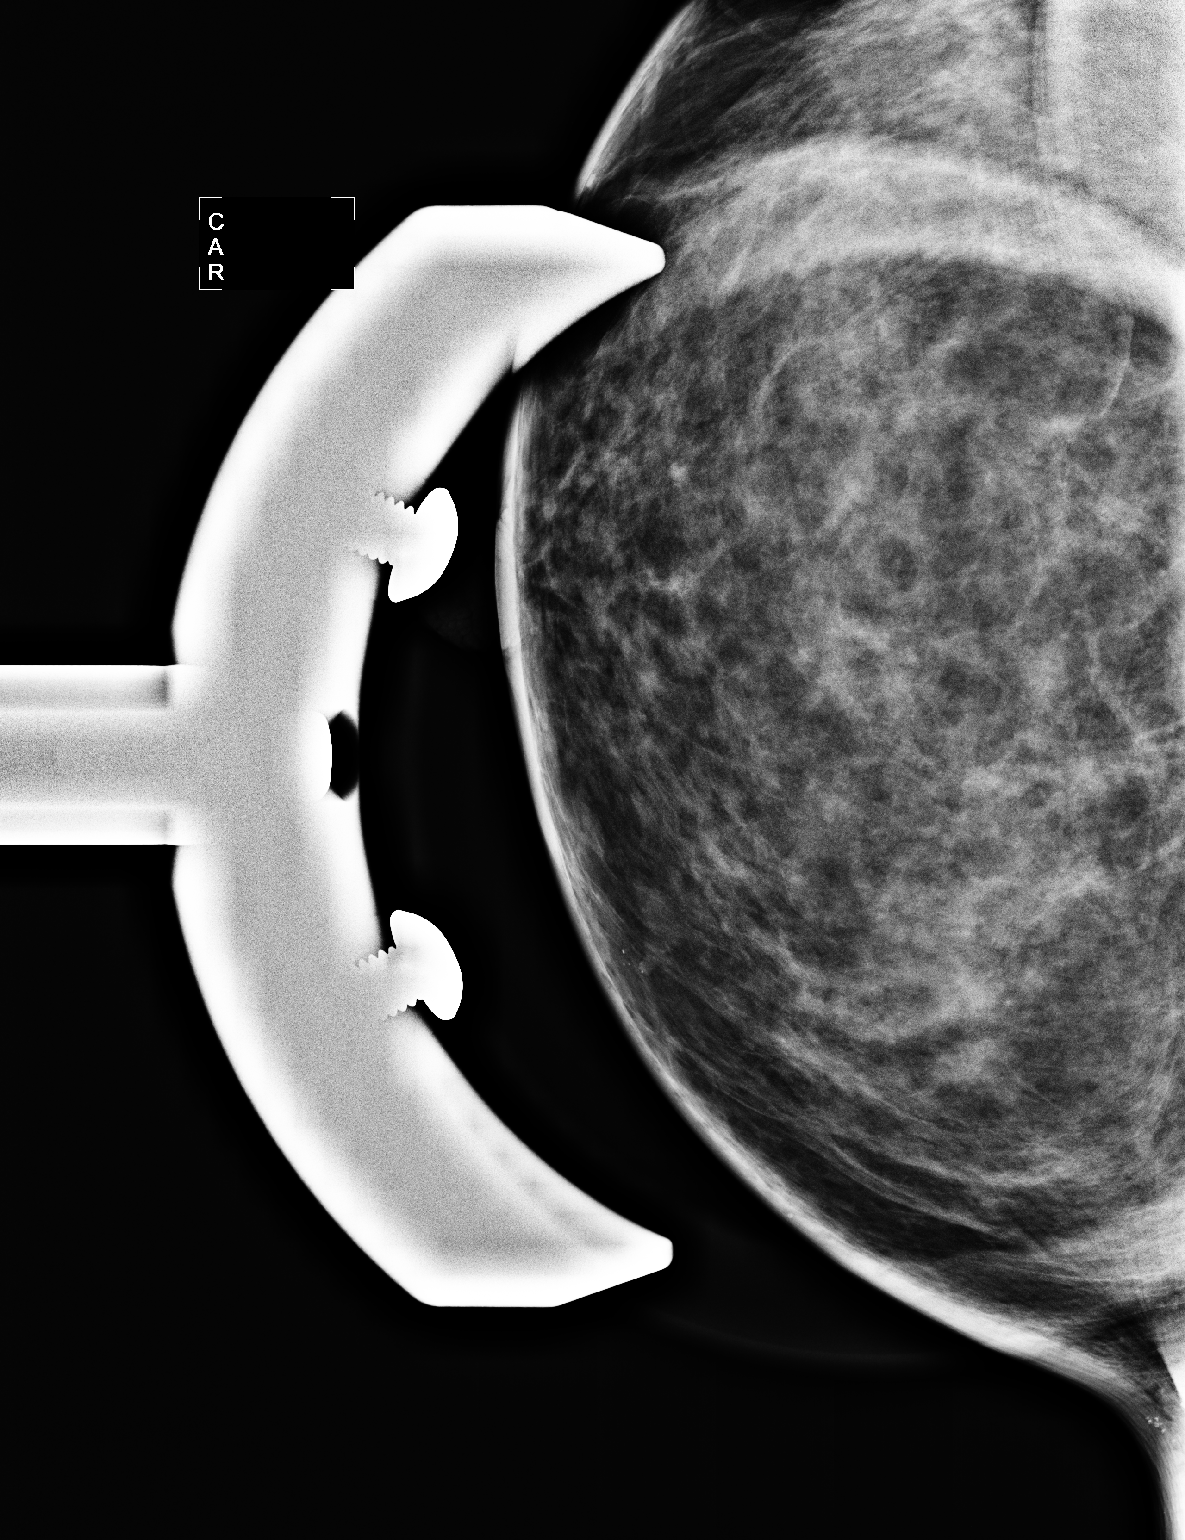

[3 of 3 positions shown; findings below may reference images not displayed]

FINDINGS: ACR Breast Density Category 3: The breast tissue is heterogeneously
dense.

Spot compression CC and MLO views of the calcifications in the
upper inner right breast were obtained.  These confirm a loosely
grouped cluster of predominately punctate calcifications without
suspicious linear or branching forms.  There is no associated mass
or architectural distortion.
IMPRESSION: Likely benign calcifications in the upper inner right breast.

RECOMMENDATION:
Diagnostic right mammogram with spot magnification views in 6
months.

The patient was encouraged to perform monthly self breast
examination and communicate any changes with her primary physician.

I have discussed the findings and recommendations with the patient.
Results were also provided in writing at the conclusion of the
visit.

BI-RADS CATEGORY 3:  Probably benign finding(s) - short interval
follow-up suggested.

## 2013-03-29 ENCOUNTER — Emergency Department (INDEPENDENT_AMBULATORY_CARE_PROVIDER_SITE_OTHER)
Admission: EM | Admit: 2013-03-29 | Discharge: 2013-03-29 | Disposition: A | Discharge status: Home / Self Care | Payer: 59 | Source: Home / Self Care | Attending: Emergency Medicine | Admitting: Emergency Medicine

## 2013-03-29 ENCOUNTER — Encounter (HOSPITAL_COMMUNITY): Payer: Self-pay | Admitting: Emergency Medicine

## 2013-03-29 DIAGNOSIS — I10 Essential (primary) hypertension: Secondary | ICD-10-CM

## 2013-03-29 DIAGNOSIS — R110 Nausea: Secondary | ICD-10-CM

## 2013-03-29 LAB — POCT URINALYSIS DIP (DEVICE)
Bilirubin Urine: NEGATIVE
Ketones, ur: NEGATIVE mg/dL
Leukocytes, UA: NEGATIVE
Protein, ur: NEGATIVE mg/dL
Specific Gravity, Urine: 1.025 (ref 1.005–1.030)
pH: 6 (ref 5.0–8.0)

## 2013-03-29 MED ORDER — ONDANSETRON HCL 4 MG PO TABS: 4.0000 mg | ORAL_TABLET | Freq: Three times a day (TID) | ORAL | Status: DC | PRN

## 2013-03-29 NOTE — ED Provider Notes (Signed)
Medical screening examination/treatment/procedure(s) were performed by non-physician practitioner and as supervising physician I was immediately available for consultation/collaboration.  Leslee Home, M.D.  Reuben Likes, MD 03/29/13 2222

## 2013-03-29 NOTE — ED Provider Notes (Signed)
CSN: [id number]     Arrival date & time 03/29/13  1312 History   None    Chief Complaint  Patient presents with  . URI   (Consider location/radiation/quality/duration/timing/severity/associated sxs/prior Treatment) HPI Comments: Patient presents with generalized malaise. States she had been working 7 days a week, 12 hour shifts at a [employer]. Endorses that it has been at least 2 weeks since she last took her anti-hypertensive medications. She cannot explain why she has not taken her meds, aother than she has been too busy. She does have plenty of the medication available at home. Since she has been off her medications, she states she has had fatigue/malaise, loss of appetite, occasional episodes of dizziness, occasional nosebleeds. Denies chest pain, pedal edema, dyspnea, syncope, near syncope, changes in vision, focal numbness/weakness. Admits that she knows she would likely feel better if she took medication as prescribed.   The history is provided by the patient.    Past Medical History  Diagnosis Date  . Depression   . Hypertension   . GERD (gastroesophageal reflux disease)   . Insomnia   . Headache(784.0)   . Tobacco user    History reviewed. No pertinent past surgical history. No family history on file. History  Substance Use Topics  . Smoking status: Current Every Day Smoker -- 2.00 packs/day  . Smokeless tobacco: Not on file     Comment: ready to quit  . Alcohol Use: Yes     Comment: occasional   OB History   Grav Para Term Preterm Abortions TAB SAB Ect Mult Living                 Review of Systems  Constitutional: Positive for appetite change and fatigue.  HENT: Positive for nosebleeds.   Eyes: Negative.   Respiratory: Negative.   Cardiovascular: Negative.   Gastrointestinal: Positive for nausea and diarrhea. Negative for vomiting, abdominal pain and abdominal distention.  Endocrine: Negative for polydipsia, polyphagia and polyuria.  Genitourinary:  Negative.   Musculoskeletal: Negative.   Allergic/Immunologic: Negative for immunocompromised state.  Neurological: Positive for dizziness and headaches. Negative for tremors, seizures, syncope, speech difficulty, weakness, light-headedness and numbness.  Hematological: Negative for adenopathy.  Psychiatric/Behavioral: Negative.     Allergies  Penicillins and Sulfonamide derivatives  Home Medications   Current Outpatient Rx  Name  Route  Sig  Dispense  Refill  . atenolol (TENORMIN) 25 MG tablet   Oral   Take 25 mg by mouth daily.           . bacitracin ointment   Topical   Apply topically 2 (two) times daily. Apply bid x 7 days   120 g   0   . citalopram (CELEXA) 20 MG tablet   Oral   Take 20 mg by mouth daily.           . hydrochlorothiazide 25 MG tablet   Oral   Take 25 mg by mouth daily.           . lansoprazole (PREVACID) 15 MG capsule   Oral   Take 30 mg by mouth daily.           Marland Kitchen lisinopril (PRINIVIL,ZESTRIL) 40 MG tablet   Oral   Take 40 mg by mouth daily.           . MedroxyPROGESTERone Acetate (DEPO-PROVERA IM)   Intramuscular   Inject into the muscle. Dose unknown as administered by health department.          Marland Kitchen  mirtazapine (REMERON) 15 MG tablet   Oral   Take 15 mg by mouth at bedtime.           Marland Kitchen tetrahydrozoline-zinc (VISINE-AC) 0.05-0.25 % ophthalmic solution      1 drop 3 (three) times daily as needed. For 4 days           BP 162/103  Pulse 96  Temp(Src) 98 F (36.7 C) (Oral)  Resp 20  SpO2 100% Physical Exam  Nursing note and vitals reviewed. Constitutional: She is oriented to person, place, and time. She appears well-developed and well-nourished. No distress.  HENT:  Head: Normocephalic and atraumatic.  Right Ear: Hearing, tympanic membrane, external ear and ear canal normal.  Left Ear: Hearing, tympanic membrane, external ear and ear canal normal.  Nose: Nose normal.  Mouth/Throat: Uvula is midline, oropharynx is  clear and moist and mucous membranes are normal.  Eyes: Conjunctivae and EOM are normal. Pupils are equal, round, and reactive to light. Right eye exhibits no discharge. Left eye exhibits no discharge. No scleral icterus.  Neck: Normal range of motion. Neck supple. No thyromegaly present.  Cardiovascular: Normal rate, regular rhythm and normal heart sounds.   Pulmonary/Chest: Effort normal and breath sounds normal.  Abdominal: Soft. Bowel sounds are normal. She exhibits no distension. There is no tenderness.  Musculoskeletal: Normal range of motion. She exhibits no edema and no tenderness.  Lymphadenopathy:    She has no cervical adenopathy.  Neurological: She is alert and oriented to person, place, and time. No cranial nerve deficit or sensory deficit. She exhibits normal muscle tone. Coordination and gait normal. GCS eye subscore is 4. GCS verbal subscore is 5. GCS motor subscore is 6.  Skin: Skin is warm and dry. No rash noted. No erythema. No pallor.  Psychiatric: She has a normal mood and affect. Her behavior is normal.    ED Course  Procedures (including critical care time) Labs Review Labs Reviewed - No data to display Imaging Review No results found.  EKG Interpretation    Date/Time:    Ventricular Rate:    PR Interval:    QRS Duration:   QT Interval:    QTC Calculation:   R Axis:     Text Interpretation:              MDM  The patient and i agreed that if at all possible, she should strive to take her medications as prescribed every day. Will provide Rx for zofran as she states occasionally she feels nausea after taking meds, so I hope this will help with nausea and improved compliance. She understands that she is to follow up with her PCP (Dr. Parke Simmers) if symptoms do not improve and for blood pressure recheck in 7-10 days. She was also counseled/ educated about warning signs for CVA and MI and that she is to dial 911 if symptoms such as chest pain, persistent  dizziness, changes in vision, severe headache, focal weakness/numbness, or speech difficulties occur.     Jess Barters [place], [place] 03/29/13 [phone]

## 2013-03-29 NOTE — ED Notes (Signed)
Pt c/o abd pain onset Monday w/sxs that include: dizziness, HA, epistaxis, nauseas, diarrhea, weakness, decreased appetite.  Denies: f/v Pt is alert w/no signs of acute distress... Triaged by Jamesetta Geralds, SMA

## 2013-05-08 ENCOUNTER — Other Ambulatory Visit: Payer: Self-pay | Admitting: Obstetrics and Gynecology

## 2013-05-08 DIAGNOSIS — R928 Other abnormal and inconclusive findings on diagnostic imaging of breast: Secondary | ICD-10-CM

## 2013-05-17 ENCOUNTER — Ambulatory Visit
Admission: RE | Admit: 2013-05-17 | Discharge: 2013-05-17 | Disposition: A | Discharge status: Home / Self Care | Payer: 59 | Source: Ambulatory Visit | Attending: Obstetrics and Gynecology | Admitting: Obstetrics and Gynecology

## 2013-05-17 DIAGNOSIS — R928 Other abnormal and inconclusive findings on diagnostic imaging of breast: Secondary | ICD-10-CM

## 2013-05-17 IMAGING — MG MM DIANOSTIC UNILATERAL R
2 series · 2 of 2 positions shown · non-contrast
Comparison: Previous exams.

CLINICAL DATA: 42-year-old female with 2 palpable abnormalities in
the upper right breast.

EXAM:
DIGITAL DIAGNOSTIC  RIGHT MAMMOGRAM
ULTRASOUND RIGHT BREAST

[R TAN (1 of 2)]
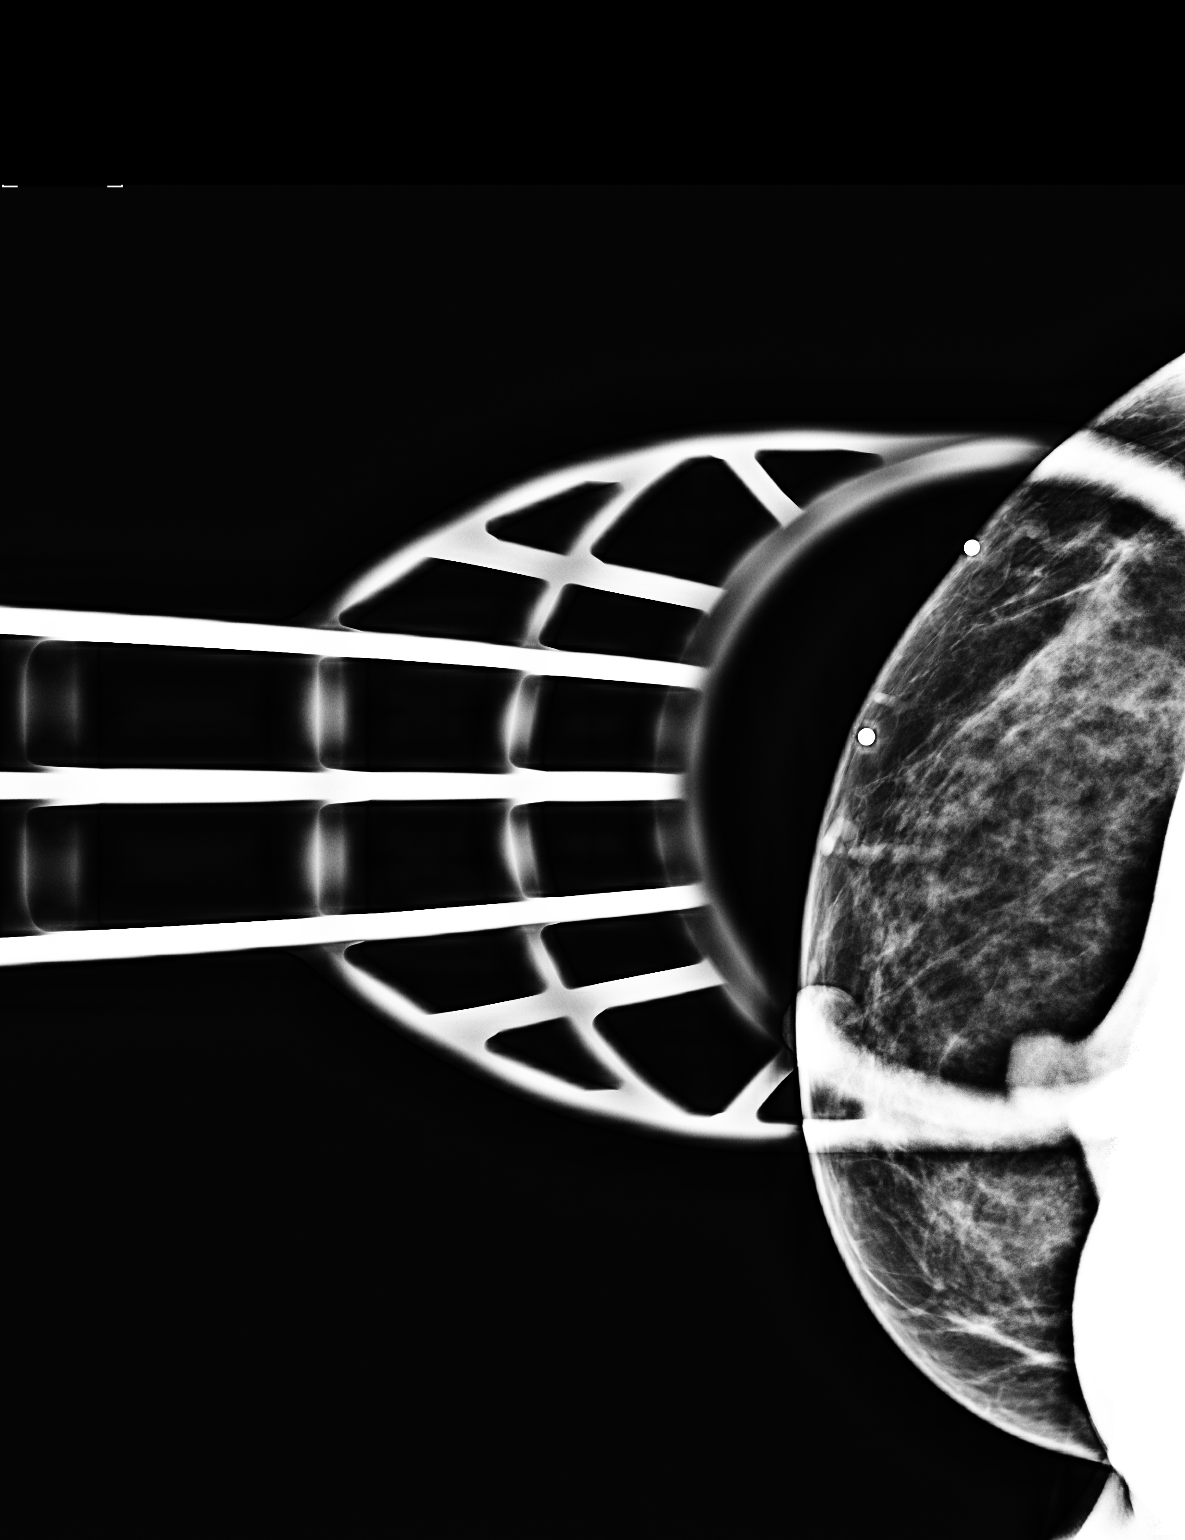

[R TAN (2 of 2)]
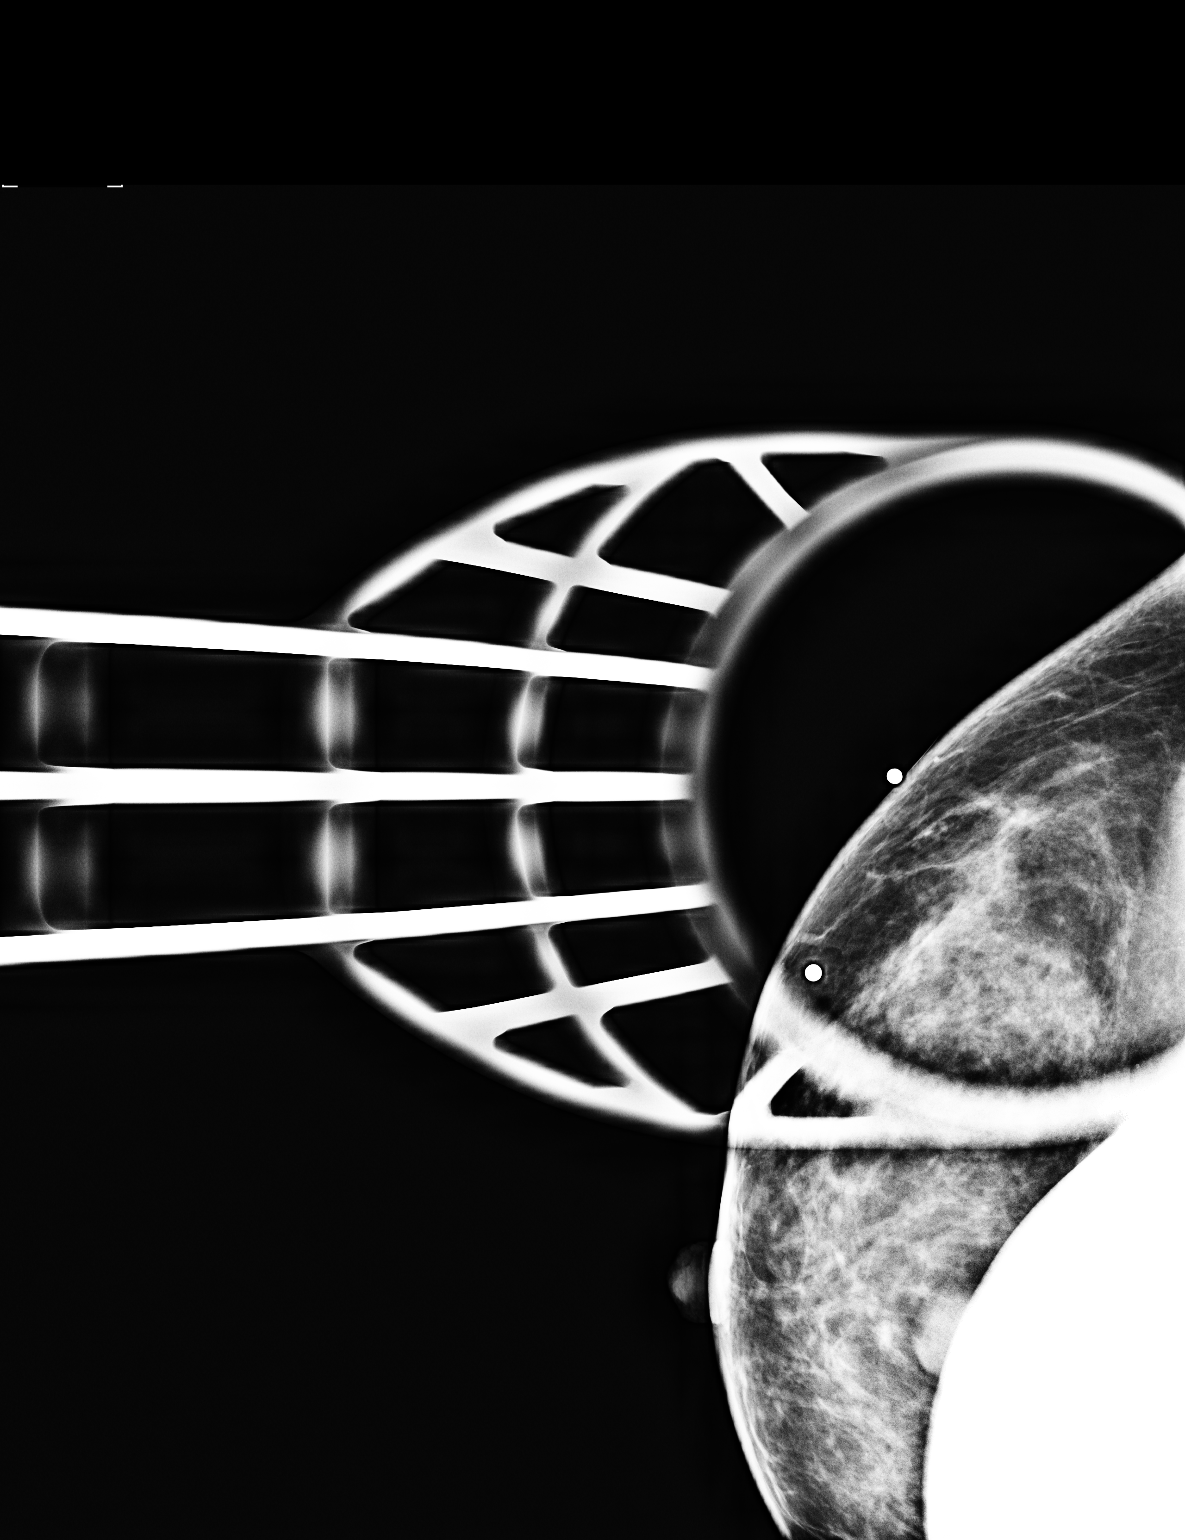

[2 of 2 positions shown; findings below may reference images not displayed]

ACR Breast Density Category c: The breast tissue is heterogeneously
dense, which may obscure small masses.
FINDINGS: No suspicious masses or calcifications are seen in the right breast.
Spot compression tangential views over the palpable sites of concern
in the right breast were performed with no definite mammographic
abnormality seen.

Physical examination at sites of palpable concern in the right
breast reveals several superficial firm masses in the upper and
upper inner right breast. Multiple black pores are seen on the skin
suggesting the presence of multiple sebaceous cysts.

Targeted ultrasound of the right breast was performed demonstrating
several small near anechoic masses within the skin compatible with
sebaceous cysts, 1 of which at 1 o'clock 5 cm from the nipple
measures 0.3 x 0.1 x 0.4 cm and another at 2 o'clock 5 cm from the
nipple measures 0.5 x 0.2 x 0.4 cm.
IMPRESSION: 1. Multiple benign sebaceous cysts in the upper inner right breast
which corresponds with the sites of palpable concern.

2.  No mammographic evidence of malignancy in the right breast.

RECOMMENDATION:
1. The patient was instructed to see her primary clinician for
further evaluation if any of the cysts appear to become infected.

2. Screening mammogram in one year.(Code:[GD])

I have discussed the findings and recommendations with the patient.
Results were also provided in writing at the conclusion of the
visit. If applicable, a reminder letter will be sent to the patient
regarding the next appointment.

BI-RADS CATEGORY  2: Benign finding(s).

## 2013-05-17 IMAGING — US US BREAST R
1 series · 8 of 8 positions shown · non-contrast
Comparison: Previous exams.

CLINICAL DATA: 42-year-old female with 2 palpable abnormalities in
the upper right breast.

EXAM:
DIGITAL DIAGNOSTIC  RIGHT MAMMOGRAM
ULTRASOUND RIGHT BREAST

[Series 1: us breast right · 8 of 8 slices shown]
[im 1/8]
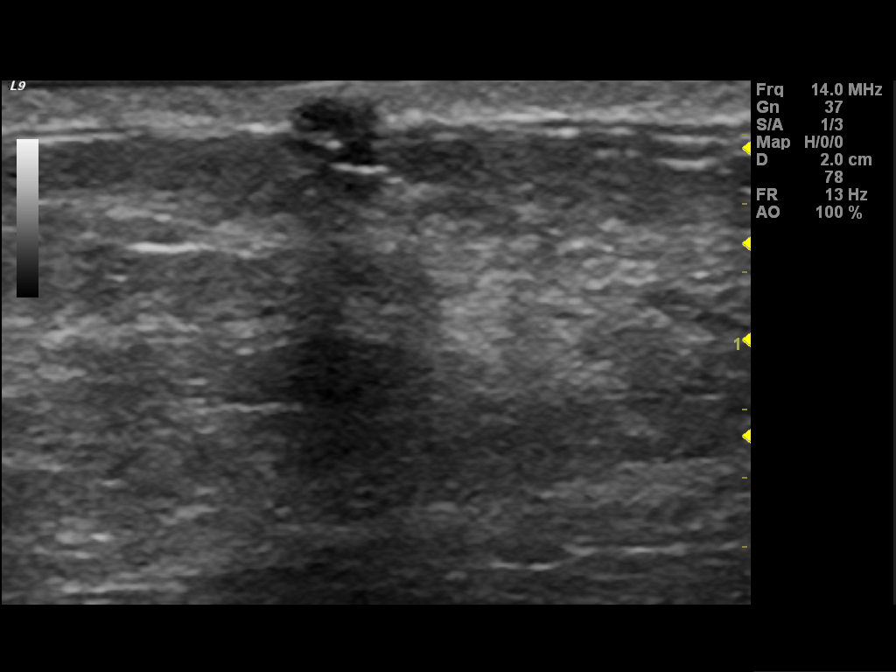
[im 2/8]
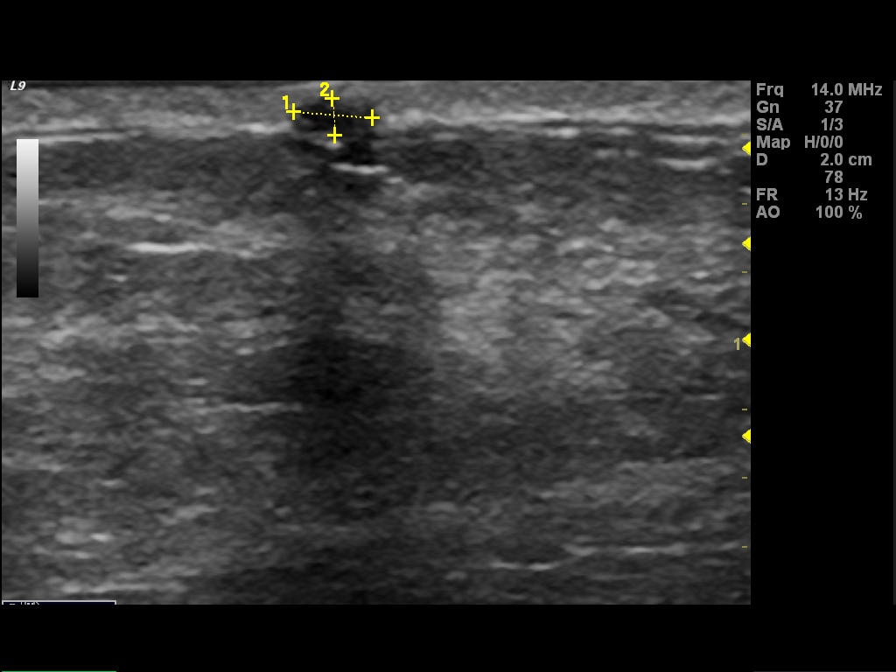
[im 3/8]
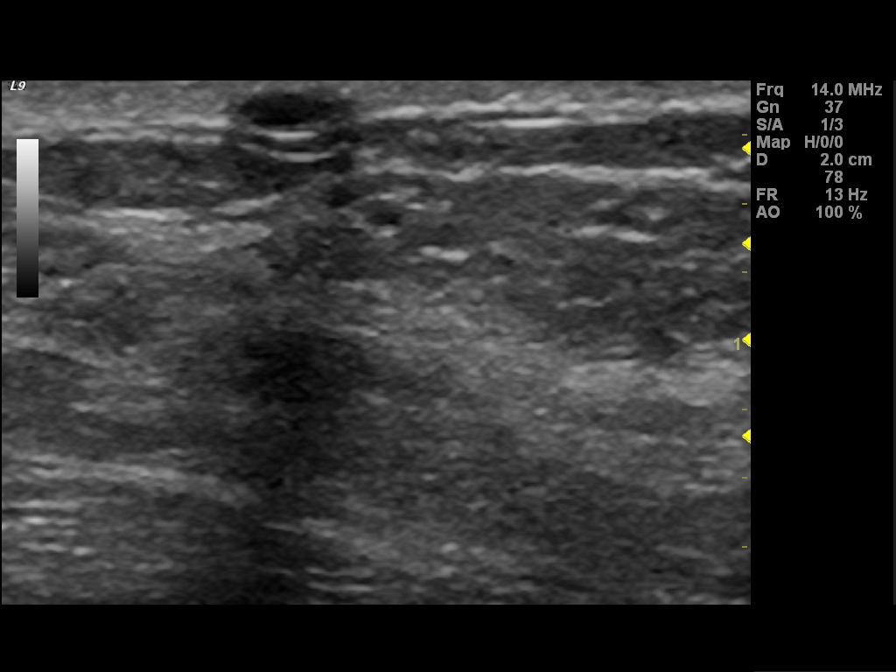
[im 4/8]
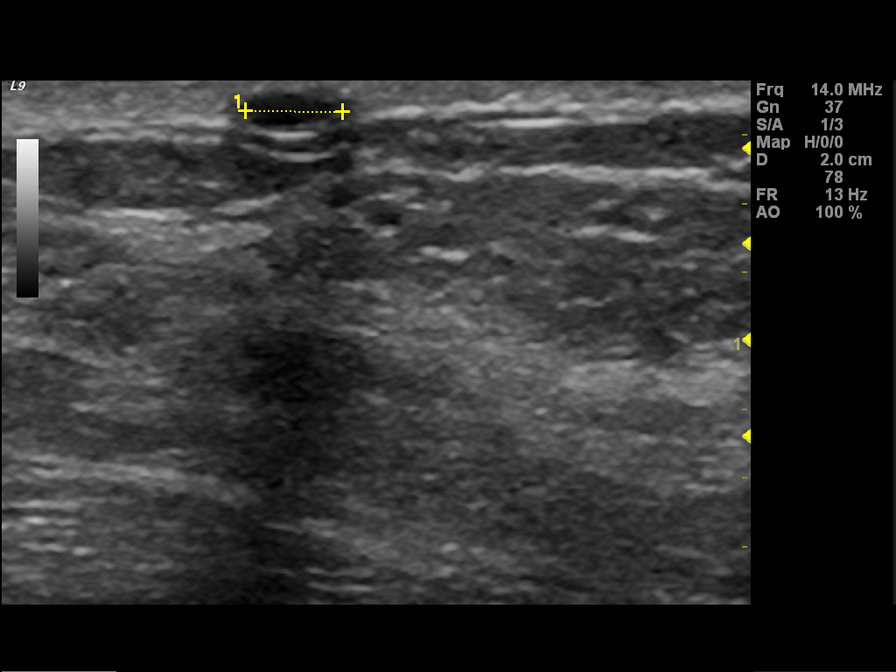
[im 5/8]
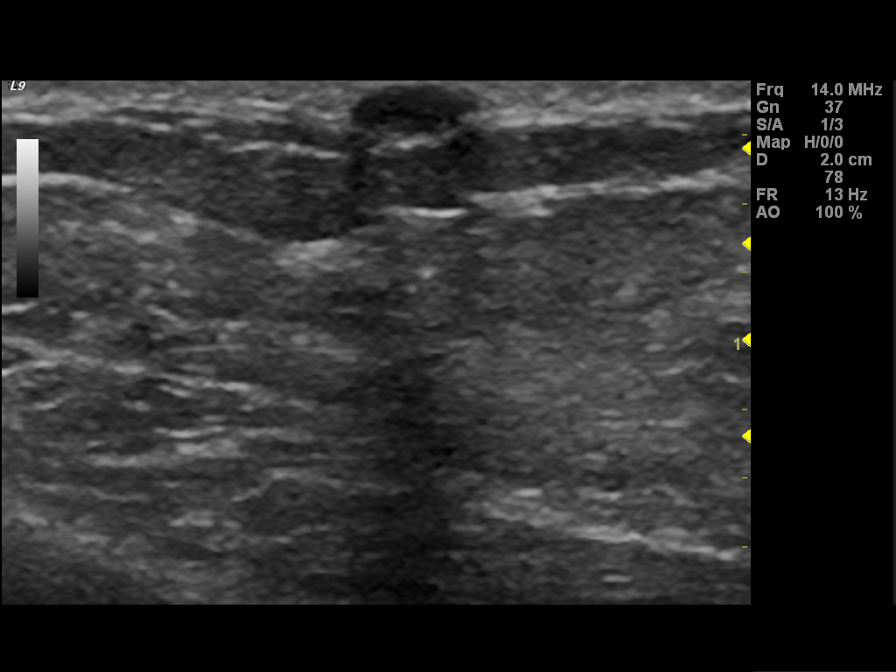
[im 6/8]
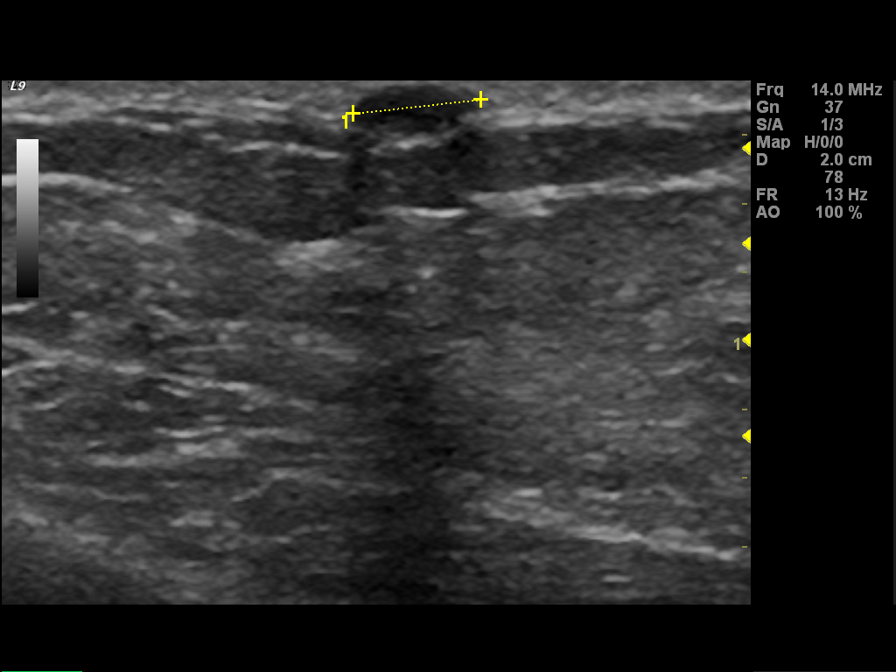
[im 7/8]
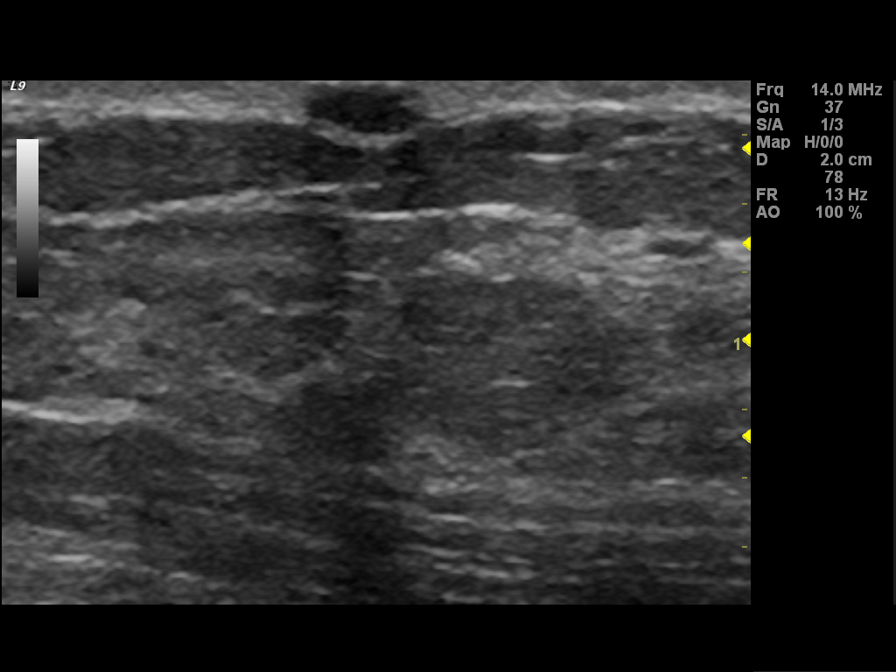
[im 8/8]
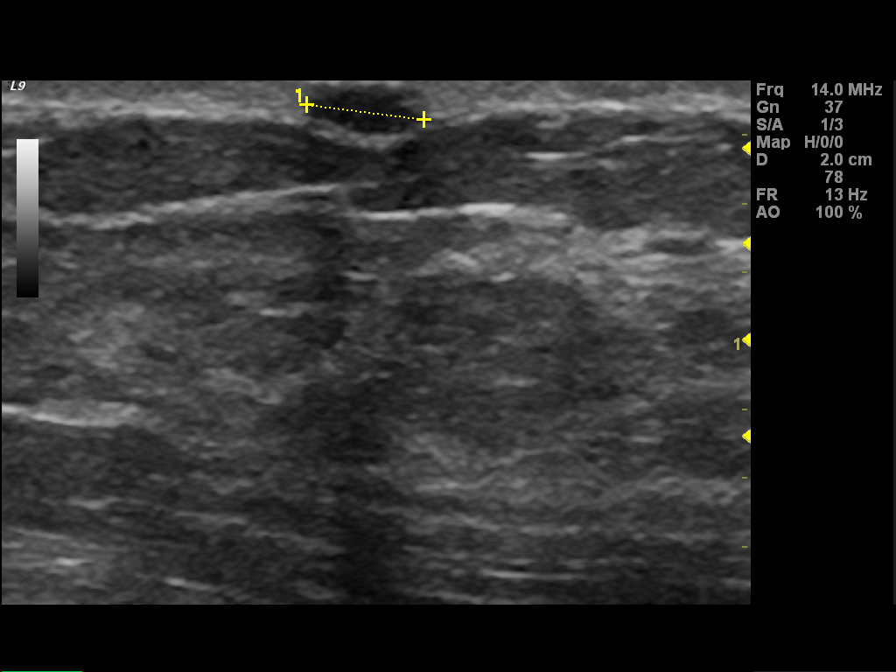

[8 of 8 positions shown; findings below may reference images not displayed]

ACR Breast Density Category c: The breast tissue is heterogeneously
dense, which may obscure small masses.
FINDINGS: No suspicious masses or calcifications are seen in the right breast.
Spot compression tangential views over the palpable sites of concern
in the right breast were performed with no definite mammographic
abnormality seen.

Physical examination at sites of palpable concern in the right
breast reveals several superficial firm masses in the upper and
upper inner right breast. Multiple black pores are seen on the skin
suggesting the presence of multiple sebaceous cysts.

Targeted ultrasound of the right breast was performed demonstrating
several small near anechoic masses within the skin compatible with
sebaceous cysts, 1 of which at 1 o'clock 5 cm from the nipple
measures 0.3 x 0.1 x 0.4 cm and another at 2 o'clock 5 cm from the
nipple measures 0.5 x 0.2 x 0.4 cm.
IMPRESSION: 1. Multiple benign sebaceous cysts in the upper inner right breast
which corresponds with the sites of palpable concern.

2.  No mammographic evidence of malignancy in the right breast.

RECOMMENDATION:
1. The patient was instructed to see her primary clinician for
further evaluation if any of the cysts appear to become infected.

2. Screening mammogram in one year.(Code:[GD])

I have discussed the findings and recommendations with the patient.
Results were also provided in writing at the conclusion of the
visit. If applicable, a reminder letter will be sent to the patient
regarding the next appointment.

BI-RADS CATEGORY  2: Benign finding(s).

## 2014-04-30 ENCOUNTER — Emergency Department (HOSPITAL_COMMUNITY)
Admission: EM | Admit: 2014-04-30 | Discharge: 2014-04-30 | Disposition: A | Discharge status: Home / Self Care | Payer: 59 | Source: Home / Self Care | Attending: Emergency Medicine | Admitting: Emergency Medicine

## 2014-04-30 ENCOUNTER — Encounter (HOSPITAL_COMMUNITY): Payer: Self-pay

## 2014-04-30 DIAGNOSIS — A084 Viral intestinal infection, unspecified: Secondary | ICD-10-CM

## 2014-04-30 MED ORDER — ONDANSETRON HCL 4 MG PO TABS: 4.0000 mg | ORAL_TABLET | Freq: Three times a day (TID) | ORAL | Status: DC | PRN

## 2014-04-30 NOTE — Discharge Instructions (Signed)
You have the stomach flu or food poisoning. Take zofran every 8 hours as needed for nausea. You will likely develop diarrhea by the end of the day. Drink PLENTY of fluids to stay hydrated. The worst should be over in 2 days.  If you develop abdominal pain, fevers, blood in stool, or are unable to keep anything down, please come back.

## 2014-04-30 NOTE — ED Provider Notes (Signed)
CSN: [id number]     Arrival date & time 04/30/14  1246 History   First MD Initiated Contact with Patient 04/30/14 1338     No chief complaint on file. CC: nausea  (Consider location/radiation/quality/duration/timing/severity/associated sxs/prior Treatment) HPI She is a 44 year old woman here for evaluation of nausea. She states she was feeling well until this morning around 8:00. At that time she felt flushed, her stomach was turning over, she was nauseated, and she was dizzy. She also got generally weak. The dizziness has resolved. She continues to intermittently feel nauseated. She has not vomited. She denies any diarrhea. She continues to have an active stomach. She has been able to tolerate fluids since this started.  Past Medical History  Diagnosis Date  . Depression   . Hypertension   . GERD (gastroesophageal reflux disease)   . Insomnia   . Headache(784.0)   . Tobacco user    No past surgical history on file. No family history on file. History  Substance Use Topics  . Smoking status: Current Every Day Smoker -- 2.00 packs/day  . Smokeless tobacco: Not on file     Comment: ready to quit  . Alcohol Use: Yes     Comment: occasional   OB History    No data available     Review of Systems  Constitutional: Positive for fatigue. Negative for fever and chills.  Gastrointestinal: Positive for nausea. Negative for vomiting, abdominal pain, diarrhea and constipation.  Neurological: Positive for dizziness.    Allergies  Penicillins and Sulfonamide derivatives  Home Medications   Prior to Admission medications   Medication Sig Start Date End Date Taking? Authorizing Provider  atenolol (TENORMIN) 25 MG tablet Take 25 mg by mouth daily.      Historical Provider, MD  bacitracin ointment Apply topically 2 (two) times daily. Apply bid x 7 days 01/07/12   Jimmie Molly, MD  citalopram (CELEXA) 20 MG tablet Take 20 mg by mouth daily.      Historical Provider, MD  hydrochlorothiazide  25 MG tablet Take 25 mg by mouth daily.      Historical Provider, MD  lansoprazole (PREVACID) 15 MG capsule Take 30 mg by mouth daily.      Historical Provider, MD  lisinopril (PRINIVIL,ZESTRIL) 40 MG tablet Take 40 mg by mouth daily.      Historical Provider, MD  MedroxyPROGESTERone Acetate (DEPO-PROVERA IM) Inject into the muscle. Dose unknown as administered by health department.     Historical Provider, MD  mirtazapine (REMERON) 15 MG tablet Take 15 mg by mouth at bedtime.      Historical Provider, MD  ondansetron (ZOFRAN) 4 MG tablet Take 1 tablet (4 mg total) by mouth every 8 (eight) hours as needed for nausea. 04/30/14   Charm Rings, MD  tetrahydrozoline-zinc (VISINE-AC) 0.05-0.25 % ophthalmic solution 1 drop 3 (three) times daily as needed. For 4 days     Historical Provider, MD   BP 147/101 mmHg  Pulse 94  Temp(Src) 98.2 F (36.8 C) (Oral)  Resp 16  SpO2 100% Physical Exam  Constitutional: She is oriented to person, place, and time. She appears well-developed and well-nourished. No distress.  Neck: Neck supple.  Cardiovascular: Normal rate, regular rhythm and normal heart sounds.   No murmur heard. Pulmonary/Chest: Effort normal and breath sounds normal. No respiratory distress. She has no wheezes. She has no rales.  Abdominal: Soft. She exhibits no distension. There is no tenderness. There is no rebound and no guarding.  Neurological: She  is alert and oriented to person, place, and time.    ED Course  Procedures (including critical care time) Labs Review Labs Reviewed - No data to display  Imaging Review No results found.   MDM   1. Viral gastroenteritis    Symptomatic treatment with Zofran. Discussed importance of fluid intake and rest. Work note provided. Reasons to return reviewed as in after visit summary.    Charm RingsErin J Shaylyn Bawa, MD 04/30/14 [phone]

## 2014-04-30 NOTE — ED Notes (Signed)
Reported GI problem

## 2014-12-12 ENCOUNTER — Emergency Department (HOSPITAL_COMMUNITY)
Admission: EM | Admit: 2014-12-12 | Discharge: 2014-12-12 | Disposition: A | Discharge status: Home / Self Care | Payer: 59 | Attending: Emergency Medicine | Admitting: Emergency Medicine

## 2014-12-12 ENCOUNTER — Emergency Department (HOSPITAL_COMMUNITY): Payer: 59

## 2014-12-12 ENCOUNTER — Encounter (HOSPITAL_COMMUNITY): Payer: Self-pay | Admitting: Emergency Medicine

## 2014-12-12 DIAGNOSIS — K219 Gastro-esophageal reflux disease without esophagitis: Secondary | ICD-10-CM | POA: Diagnosis not present

## 2014-12-12 DIAGNOSIS — F329 Major depressive disorder, single episode, unspecified: Secondary | ICD-10-CM | POA: Diagnosis not present

## 2014-12-12 DIAGNOSIS — Z79899 Other long term (current) drug therapy: Secondary | ICD-10-CM | POA: Insufficient documentation

## 2014-12-12 DIAGNOSIS — Z8669 Personal history of other diseases of the nervous system and sense organs: Secondary | ICD-10-CM | POA: Insufficient documentation

## 2014-12-12 DIAGNOSIS — Z720 Tobacco use: Secondary | ICD-10-CM | POA: Diagnosis not present

## 2014-12-12 DIAGNOSIS — I10 Essential (primary) hypertension: Secondary | ICD-10-CM | POA: Insufficient documentation

## 2014-12-12 DIAGNOSIS — R42 Dizziness and giddiness: Secondary | ICD-10-CM | POA: Diagnosis present

## 2014-12-12 IMAGING — CT CT HEAD W/O CM
1 series · 16 of 30 positions shown, 20 images · non-contrast
Comparison: None

CLINICAL DATA: Dizziness and headache.

EXAM:
CT HEAD WITHOUT CONTRAST
TECHNIQUE: Contiguous axial images were obtained from the base of the skull
through the vertex without contrast.

[Series 2: head 5.0 h30s · axial · 0.42mm/px · z∈[-84,+51]mm · 16 of 31 slices shown, 20 images]
[im 2/31  brain]
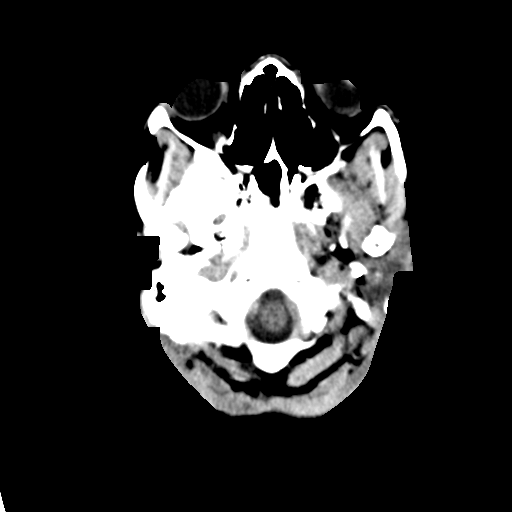
[im 2/31  bone]
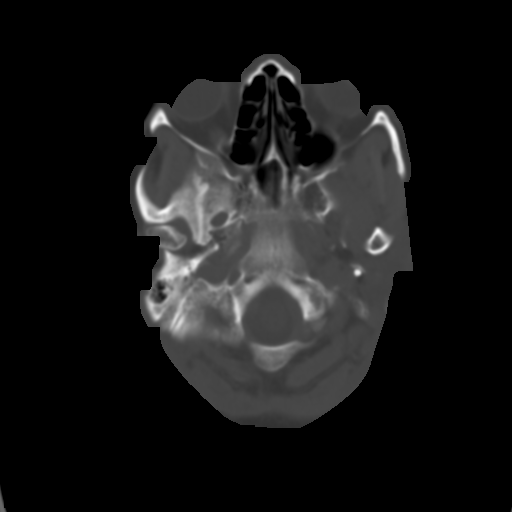
[im 4/31  brain]
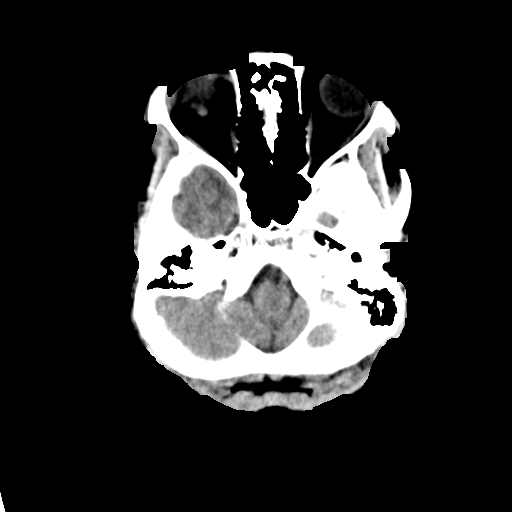
[im 6/31  brain]
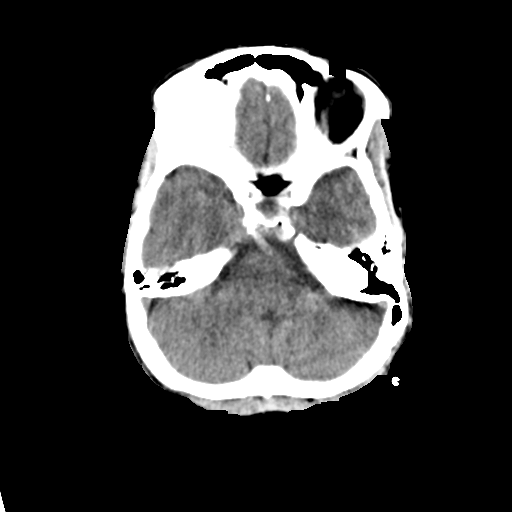
[im 8/31  brain]
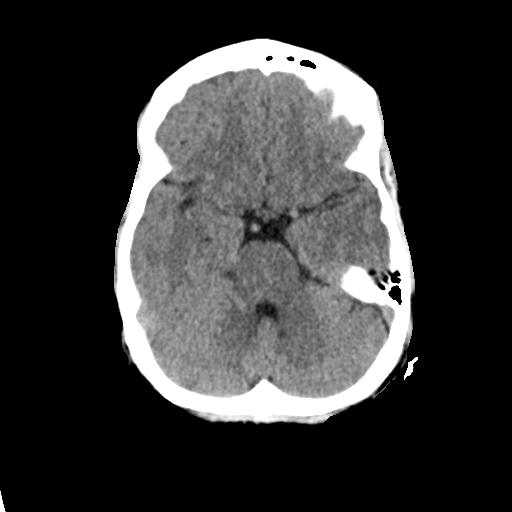
[im 9/31  brain]
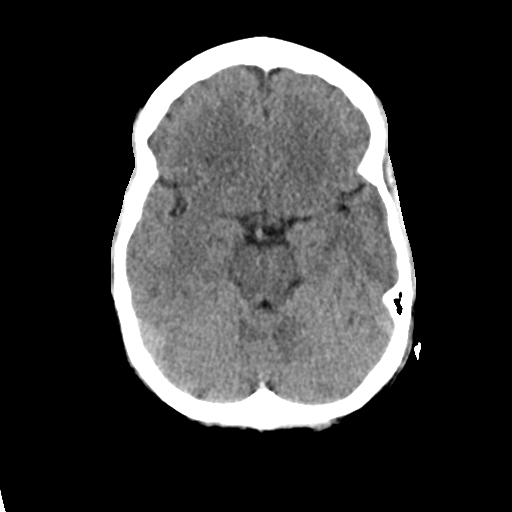
[im 9/31  bone]
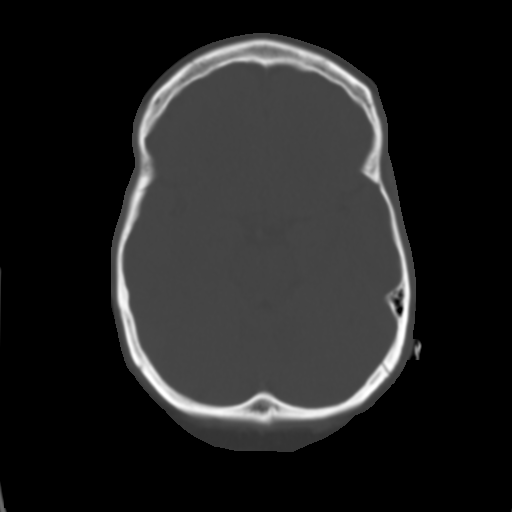
[im 11/31  brain]
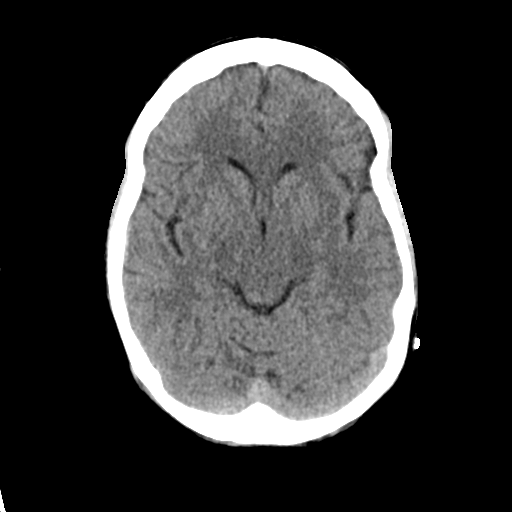
[im 13/31  brain]
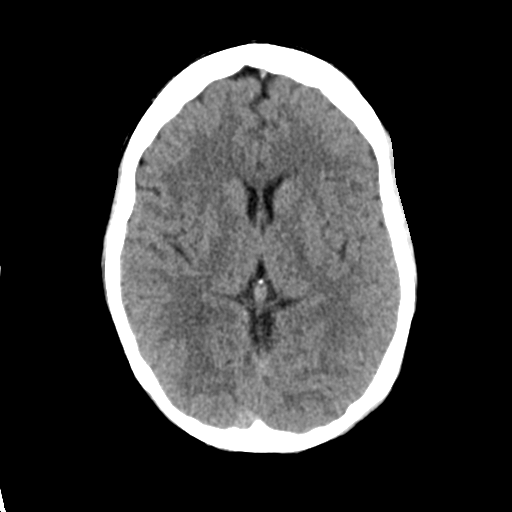
[im 15/31  brain]
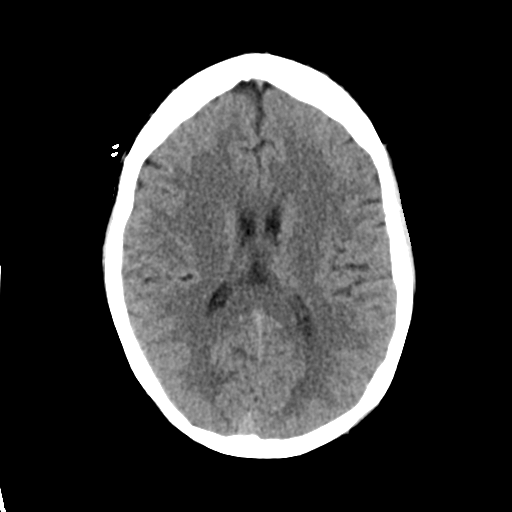
[im 16/31  brain]
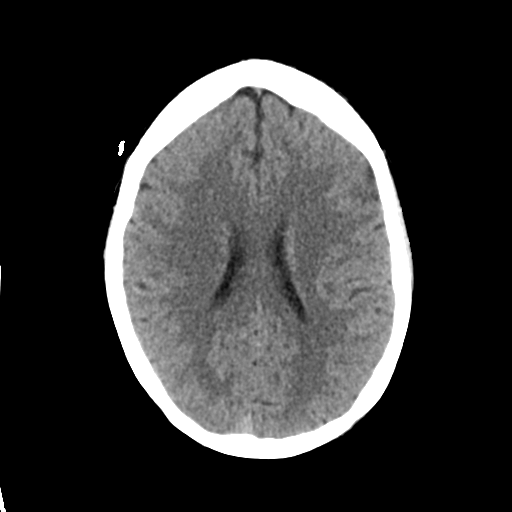
[im 16/31  bone]
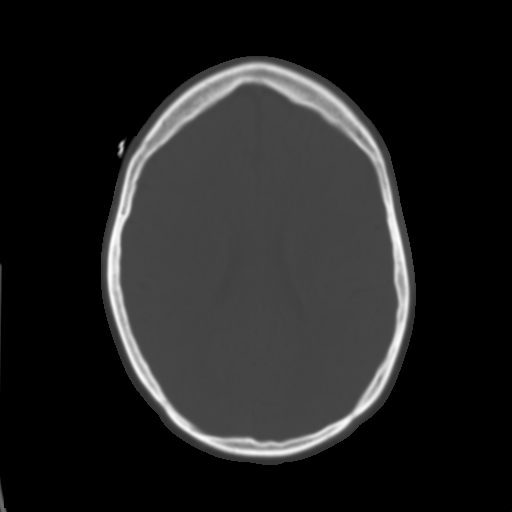
[im 18/31  brain]
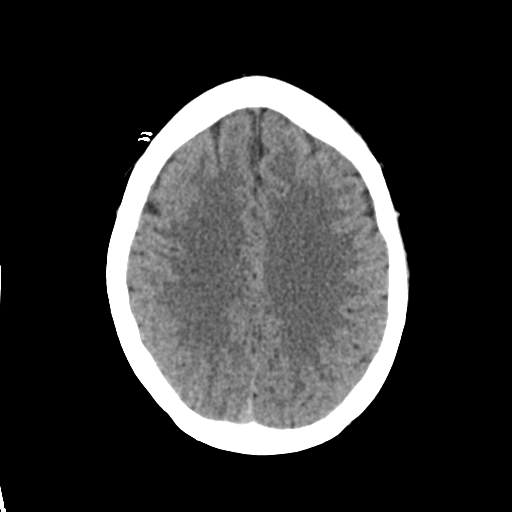
[im 20/31  brain]
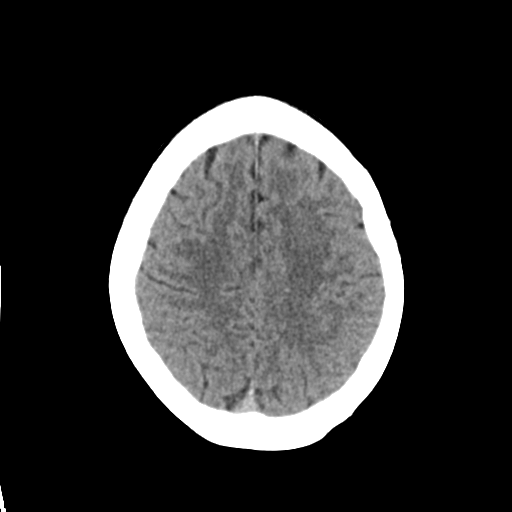
[im 22/31  brain]
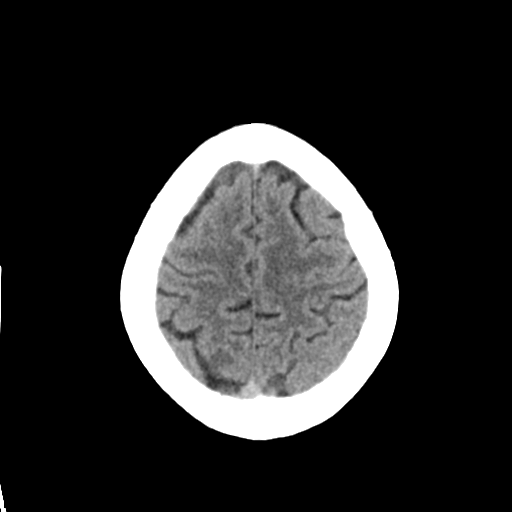
[im 23/31  brain]
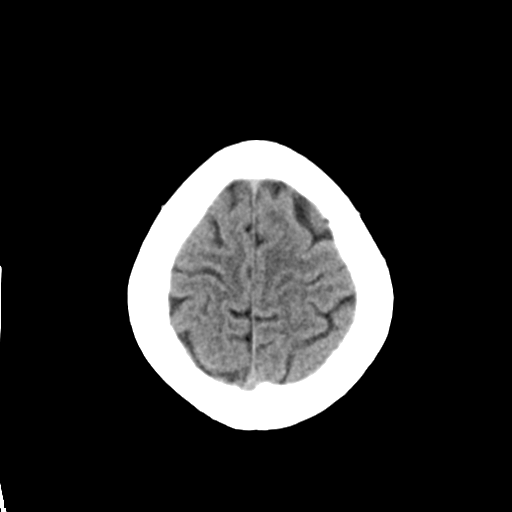
[im 23/31  bone]
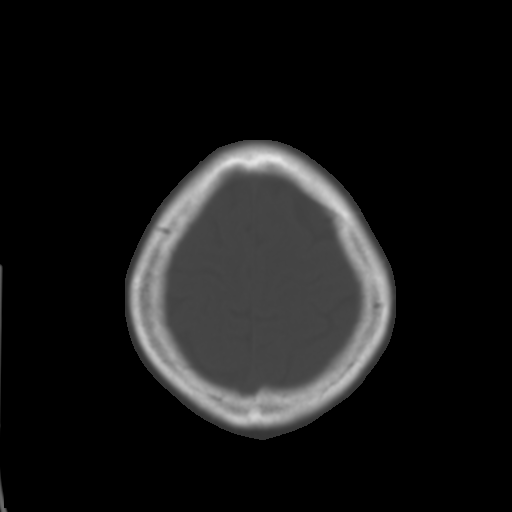
[im 25/31  brain]
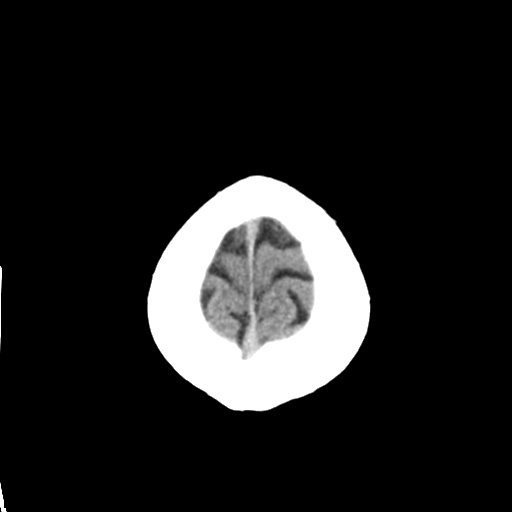
[im 27/31  brain]
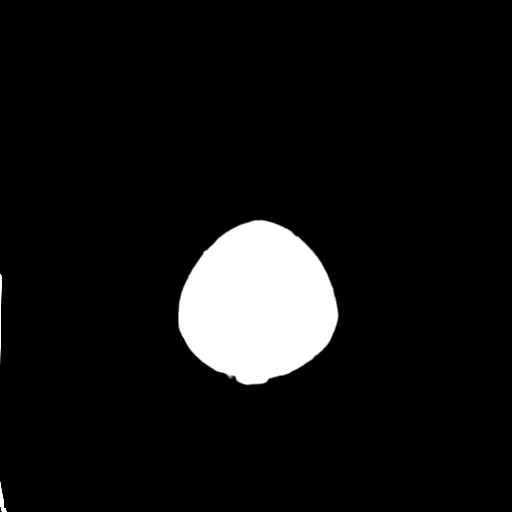
[im 29/31  brain]
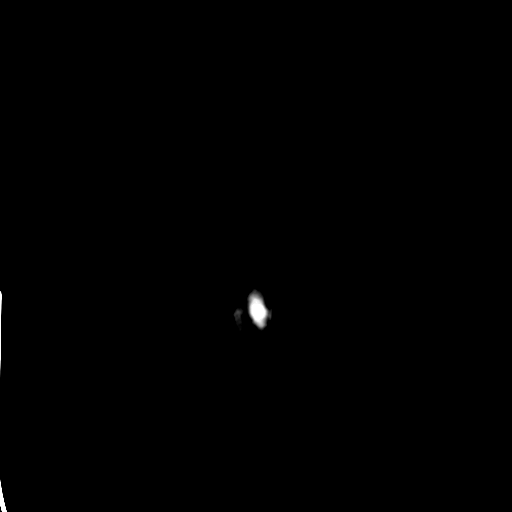

[16 of 30 positions shown; findings below may reference images not displayed]

FINDINGS: Normal appearance of the intracranial structures. No evidence for
acute hemorrhage, mass lesion, midline shift, hydrocephalus or large
infarct. No acute bony abnormality. The visualized sinuses are
clear.
IMPRESSION: Negative head CT.

## 2014-12-12 MED ORDER — LABETALOL HCL 5 MG/ML IV SOLN
20.0000 mg | Freq: Once | INTRAVENOUS | Status: AC
  Administered 2014-12-12: 20 mg via INTRAVENOUS
  Filled 2014-12-12: qty 4

## 2014-12-12 NOTE — ED Notes (Signed)
Pt to ct 

## 2014-12-12 NOTE — ED Notes (Signed)
Per EMS: dizziness and weird feeling, headache since this am.  Hypertensive.  Sent over from [facility].  She was going to get a depo injection this am.

## 2014-12-12 NOTE — ED Provider Notes (Signed)
CSN: [id number]     Arrival date & time 12/12/14  0940 History   First MD Initiated Contact with Patient 12/12/14 [phone]     Chief Complaint  Patient presents with  . Dizziness  . Headache     (Consider location/radiation/quality/duration/timing/severity/associated sxs/prior Treatment) Patient is a 44 y.o. female presenting with dizziness and headaches.  Dizziness Quality:  Lightheadedness Severity:  Mild Onset quality:  Gradual Duration:  1 day Timing:  Constant Progression:  Unchanged Chronicity:  New Context: standing up   Relieved by:  Nothing Worsened by:  Nothing Ineffective treatments:  None tried Associated symptoms: headaches   Headaches:    Severity:  Mild   Onset quality:  Gradual   Duration:  3 days   Timing:  Intermittent   Progression:  Resolved   Chronicity:  New Headache Relieved by:  Nothing Worsened by:  Nothing Ineffective treatments:  None tried Associated symptoms: dizziness   Associated symptoms: no abdominal pain     Past Medical History  Diagnosis Date  . Depression   . Hypertension   . GERD (gastroesophageal reflux disease)   . Insomnia   . Headache(784.0)   . Tobacco user    History reviewed. No pertinent past surgical history. No family history on file. Social History  Substance Use Topics  . Smoking status: Current Every Day Smoker -- 2.00 packs/day  . Smokeless tobacco: None     Comment: ready to quit  . Alcohol Use: Yes     Comment: occasional   OB History    No data available     Review of Systems  Gastrointestinal: Negative for abdominal pain.  Neurological: Positive for dizziness and headaches.  All other systems reviewed and are negative.     Allergies  Penicillins and Sulfonamide derivatives  Home Medications   Prior to Admission medications   Medication Sig Start Date End Date Taking? Authorizing Provider  atenolol (TENORMIN) 25 MG tablet Take 25 mg by mouth daily.      Historical Provider, MD  bacitracin  ointment Apply topically 2 (two) times daily. Apply bid x 7 days 01/07/12   Jimmie Molly, MD  citalopram (CELEXA) 20 MG tablet Take 20 mg by mouth daily.      Historical Provider, MD  hydrochlorothiazide 25 MG tablet Take 25 mg by mouth daily.      Historical Provider, MD  lansoprazole (PREVACID) 15 MG capsule Take 30 mg by mouth daily.      Historical Provider, MD  lisinopril (PRINIVIL,ZESTRIL) 40 MG tablet Take 40 mg by mouth daily.      Historical Provider, MD  MedroxyPROGESTERone Acetate (DEPO-PROVERA IM) Inject into the muscle. Dose unknown as administered by health department.     Historical Provider, MD  mirtazapine (REMERON) 15 MG tablet Take 15 mg by mouth at bedtime.      Historical Provider, MD  ondansetron (ZOFRAN) 4 MG tablet Take 1 tablet (4 mg total) by mouth every 8 (eight) hours as needed for nausea. 04/30/14   Charm Rings, MD  tetrahydrozoline-zinc (VISINE-AC) 0.05-0.25 % ophthalmic solution 1 drop 3 (three) times daily as needed. For 4 days     Historical Provider, MD   BP 192/112 mmHg  Pulse 77  Temp(Src) 98.4 F (36.9 C) (Oral)  Resp 18  SpO2 100% Physical Exam  Constitutional: She is oriented to person, place, and time. She appears well-developed and well-nourished. No distress.  HENT:  Head: Normocephalic.  Eyes: Conjunctivae are normal.  Neck: Neck supple. No tracheal  deviation present.  Cardiovascular: Normal rate and regular rhythm.   Pulmonary/Chest: Effort normal. No respiratory distress.  Abdominal: Soft. She exhibits no distension.  Neurological: She is alert and oriented to person, place, and time. No cranial nerve deficit or sensory deficit. Coordination and gait normal. GCS eye subscore is 4. GCS verbal subscore is 5. GCS motor subscore is 6.  Skin: Skin is warm and dry.  Psychiatric: She has a normal mood and affect.    ED Course  Procedures (including critical care time) Labs Review Labs Reviewed - No data to display  Imaging Review Ct Head Wo  Contrast  12/12/2014   CLINICAL DATA:  Dizziness and headache.  EXAM: CT HEAD WITHOUT CONTRAST  TECHNIQUE: Contiguous axial images were obtained from the base of the skull through the vertex without contrast.  COMPARISON:  None  FINDINGS: Normal appearance of the intracranial structures. No evidence for acute hemorrhage, mass lesion, midline shift, hydrocephalus or large infarct. No acute bony abnormality. The visualized sinuses are clear.  IMPRESSION: Negative head CT.   Electronically Signed   By: Richarda Overlie M.D.   On: 12/12/2014 11:04   I have personally reviewed and evaluated these images and lab results as part of my medical decision-making.   EKG Interpretation None      MDM   Final diagnoses:  Chronic hypertension    44 year old female presents with chronic hypertension and noncompliance with medications. She did not take her blood pressure medicine yesterday and today she is feeling unwell and despite taking her medicine her blood pressure remains elevated. She is complaining of some dizziness and trouble walking that was ambulatory into the emergency department without difficulty. Head CT performed to rule out hypertensive bleed and none is present. The patient was given a single dose of antihypertensives and recommended to remain compliant with her medicines, follow closely with her primary care physician, and return for any worsening or new concerning symptoms.    Lyndal Pulley, MD 12/12/14 [phone]

## 2014-12-12 NOTE — Discharge Instructions (Signed)
TAKE YOUR BLOOD PRESSURE MEDICATION AS PRESCRIBED Hypertension Hypertension, commonly called high blood pressure, is when the force of blood pumping through your arteries is too strong. Your arteries are the blood vessels that carry blood from your heart throughout your body. A blood pressure reading consists of a higher number over a lower number, such as 110/72. The higher number (systolic) is the pressure inside your arteries when your heart pumps. The lower number (diastolic) is the pressure inside your arteries when your heart relaxes. Ideally you want your blood pressure below 120/80. Hypertension forces your heart to work harder to pump blood. Your arteries may become narrow or stiff. Having hypertension puts you at risk for heart disease, stroke, and other problems.  RISK FACTORS Some risk factors for high blood pressure are controllable. Others are not.  Risk factors you cannot control include:   Race. You may be at higher risk if you are African American.  Age. Risk increases with age.  Gender. Men are at higher risk than women before age 74 years. After age 57, women are at higher risk than men. Risk factors you can control include:  Not getting enough exercise or physical activity.  Being overweight.  Getting too much fat, sugar, calories, or salt in your diet.  Drinking too much alcohol. SIGNS AND SYMPTOMS Hypertension does not usually cause signs or symptoms. Extremely high blood pressure (hypertensive crisis) may cause headache, anxiety, shortness of breath, and nosebleed. DIAGNOSIS  To check if you have hypertension, your health care provider will measure your blood pressure while you are seated, with your arm held at the level of your heart. It should be measured at least twice using the same arm. Certain conditions can cause a difference in blood pressure between your right and left arms. A blood pressure reading that is higher than normal on one occasion does not mean that  you need treatment. If one blood pressure reading is high, ask your health care provider about having it checked again. TREATMENT  Treating high blood pressure includes making lifestyle changes and possibly taking medicine. Living a healthy lifestyle can help lower high blood pressure. You may need to change some of your habits. Lifestyle changes may include:  Following the DASH diet. This diet is high in fruits, vegetables, and whole grains. It is low in salt, red meat, and added sugars.  Getting at least 2 hours of brisk physical activity every week.  Losing weight if necessary.  Not smoking.  Limiting alcoholic beverages.  Learning ways to reduce stress. If lifestyle changes are not enough to get your blood pressure under control, your health care provider may prescribe medicine. You may need to take more than one. Work closely with your health care provider to understand the risks and benefits. HOME CARE INSTRUCTIONS  Have your blood pressure rechecked as directed by your health care provider.   Take medicines only as directed by your health care provider. Follow the directions carefully. Blood pressure medicines must be taken as prescribed. The medicine does not work as well when you skip doses. Skipping doses also puts you at risk for problems.   Do not smoke.   Monitor your blood pressure at home as directed by your health care provider. SEEK MEDICAL CARE IF:   You think you are having a reaction to medicines taken.  You have recurrent headaches or feel dizzy.  You have swelling in your ankles.  You have trouble with your vision. SEEK IMMEDIATE MEDICAL CARE IF:  You develop a severe headache or confusion.  You have unusual weakness, numbness, or feel faint.  You have severe chest or abdominal pain.  You vomit repeatedly.  You have trouble breathing. MAKE SURE YOU:   Understand these instructions.  Will watch your condition.  Will get help right away if  you are not doing well or get worse. Document Released: 03/21/2005 Document Revised: 08/05/2013 Document Reviewed: 01/11/2013 [facility] Patient Information 2015 [place], [place]. This information is not intended to replace advice given to you by your health care provider. Make sure you discuss any questions you have with your health care provider.

## 2015-08-26 ENCOUNTER — Encounter (HOSPITAL_COMMUNITY): Payer: Self-pay | Admitting: *Deleted

## 2015-08-26 ENCOUNTER — Ambulatory Visit (HOSPITAL_COMMUNITY)
Admission: EM | Admit: 2015-08-26 | Discharge: 2015-08-26 | Disposition: A | Discharge status: Home / Self Care | Payer: 59 | Attending: Family Medicine | Admitting: Family Medicine

## 2015-08-26 DIAGNOSIS — R109 Unspecified abdominal pain: Secondary | ICD-10-CM

## 2015-08-26 DIAGNOSIS — I10 Essential (primary) hypertension: Secondary | ICD-10-CM

## 2015-08-26 MED ORDER — DICYCLOMINE HCL 20 MG PO TABS: 20.0000 mg | ORAL_TABLET | Freq: Two times a day (BID) | ORAL | Status: DC

## 2015-08-26 NOTE — ED Notes (Signed)
Patient reports 2 day history of upper abdominal cramping, no nausea, reports mild diarrhea. Patient also reports mild headaches and dizziness. Patient BP noted to be 220/136 and 197/122 during triage. Patient is non compliant with her lisinopril medication.

## 2015-08-26 NOTE — ED Provider Notes (Signed)
CSN: [id number]     Arrival date & time 08/26/15  1301 History   First MD Initiated Contact with Patient 08/26/15 1339     Chief Complaint  Patient presents with  . Abdominal Pain  . Dizziness   (Consider location/radiation/quality/duration/timing/severity/associated sxs/prior Treatment) HPI History obtained from patient:  Pt presents with the cc of:  Abdominal pain  Duration of symptoms: 4-5 days Treatment prior to arrival: None Context: Sudden onset of symptoms about 5 days ago with some diarrhea sensation nausea Other symptoms include: Cramping and abdomen Pain score: 0 FAMILY HISTORY family history of ulcers and diabetes    Past Medical History  Diagnosis Date  . Depression   . Hypertension   . GERD (gastroesophageal reflux disease)   . Insomnia   . Headache(784.0)   . Tobacco user    History reviewed. No pertinent past surgical history. Family History  Problem Relation Age of Onset  . Seizures Mother    Social History  Substance Use Topics  . Smoking status: Current Every Day Smoker -- 2.00 packs/day  . Smokeless tobacco: None     Comment: ready to quit  . Alcohol Use: Yes     Comment: occasional   OB History    No data available     Review of Systems  Denies: HEADACHE, NAUSEA, ABDOMINAL PAIN, CHEST PAIN, CONGESTION, DYSURIA, SHORTNESS OF BREATH  Allergies  Penicillins and Sulfonamide derivatives  Home Medications   Prior to Admission medications   Medication Sig Start Date End Date Taking? Authorizing Provider  hydrochlorothiazide 25 MG tablet Take 25 mg by mouth daily.     Yes Historical Provider, MD  lisinopril (PRINIVIL,ZESTRIL) 40 MG tablet Take 40 mg by mouth daily.     Yes Historical Provider, MD  MedroxyPROGESTERone Acetate (DEPO-PROVERA IM) Inject into the muscle. Dose unknown as administered by health department.    Yes Historical Provider, MD  atenolol (TENORMIN) 25 MG tablet Take 25 mg by mouth daily.      Historical Provider, MD   bacitracin ointment Apply topically 2 (two) times daily. Apply bid x 7 days 01/07/12   Jimmie MollyPaolo Coll, MD  citalopram (CELEXA) 20 MG tablet Take 20 mg by mouth daily.      Historical Provider, MD  lansoprazole (PREVACID) 15 MG capsule Take 30 mg by mouth daily.      Historical Provider, MD  mirtazapine (REMERON) 15 MG tablet Take 15 mg by mouth at bedtime.      Historical Provider, MD  ondansetron (ZOFRAN) 4 MG tablet Take 1 tablet (4 mg total) by mouth every 8 (eight) hours as needed for nausea. 04/30/14   Charm RingsErin J Honig, MD  tetrahydrozoline-zinc (VISINE-AC) 0.05-0.25 % ophthalmic solution 1 drop 3 (three) times daily as needed. For 4 days     Historical Provider, MD   Meds Ordered and Administered this Visit  Medications - No data to display  BP 197/122 mmHg  Pulse 87  Temp(Src) 97.6 F (36.4 C) (Oral)  Resp 16  SpO2 100% No data found.   Physical Exam NURSES NOTES AND VITAL SIGNS REVIEWED. CONSTITUTIONAL: Well developed, well nourished, no acute distress HEENT: normocephalic, atraumatic EYES: Conjunctiva normal NECK:normal ROM, supple, no adenopathy PULMONARY:No respiratory distress, normal effort ABDOMINAL: Soft, ND, NT BS+, No CVAT MUSCULOSKELETAL: Normal ROM of all extremities,  SKIN: warm and dry without rash PSYCHIATRIC: Mood and affect, behavior are normal  ED Course  Procedures (including critical care time)  Labs Review Labs Reviewed - No data to display  Imaging Review No results found.   Visual Acuity Review  Right Eye Distance:   Left Eye Distance:   Bilateral Distance:    Right Eye Near:   Left Eye Near:    Bilateral Near:      rx bentyl Reviewed blood pressure meds and instructions with patient.    MDM   1. Stomach cramps   2. Essential hypertension     Patient is reassured that there are no issues that require transfer to higher level of care at this time or additional tests. Patient is advised to continue home symptomatic treatment. Patient  is advised that if there are new or worsening symptoms to attend the emergency department, contact primary care provider, or return to UC. Instructions of care provided discharged home in stable condition.    THIS NOTE WAS GENERATED USING A VOICE RECOGNITION SOFTWARE PROGRAM. ALL REASONABLE EFFORTS  WERE MADE TO PROOFREAD THIS DOCUMENT FOR ACCURACY.  I have verbally reviewed the discharge instructions with the patient. A printed AVS was given to the patient.  All questions were answered prior to discharge.      Tharon Aquas, PA 08/26/15 [phone]

## 2015-08-26 NOTE — Discharge Instructions (Signed)
Abdominal Pain, Adult Many things can cause belly (abdominal) pain. Most times, the belly pain is not dangerous. Many cases of belly pain can be watched and treated at home. HOME CARE   Do not take medicines that help you go poop (laxatives) unless told to by your doctor.  Only take medicine as told by your doctor.  Eat or drink as told by your doctor. Your doctor will tell you if you should be on a special diet. GET HELP IF:  You do not know what is causing your belly pain.  You have belly pain while you are sick to your stomach (nauseous) or have runny poop (diarrhea).  You have pain while you pee or poop.  Your belly pain wakes you up at night.  You have belly pain that gets worse or better when you eat.  You have belly pain that gets worse when you eat fatty foods.  You have a fever. GET HELP RIGHT AWAY IF:   The pain does not go away within 2 hours.  You keep throwing up (vomiting).  The pain changes and is only in the right or left part of the belly.  You have bloody or tarry looking poop. MAKE SURE YOU:   Understand these instructions.  Will watch your condition.  Will get help right away if you are not doing well or get worse.   This information is not intended to replace advice given to you by your health care provider. Make sure you discuss any questions you have with your health care provider.   Document Released: 09/07/2007 Document Revised: 04/11/2014 Document Reviewed: 11/28/2012 Elsevier Interactive Patient Education 2016 [employer] Inc[employer] Managing Your High Blood Pressure Blood pressure is a measurement of how forceful your blood is pressing against the walls of the arteries. Arteries are muscular tubes within the circulatory system. Blood pressure does not stay the same. Blood pressure rises when you are active, excited, or nervous; and it lowers during sleep and relaxation. If the numbers measuring your blood pressure stay above normal most of the time,  you are at risk for health problems. High blood pressure (hypertension) is a long-term (chronic) condition in which blood pressure is elevated. A blood pressure reading is recorded as two numbers, such as 120 over 80 (or 120/80). The first, higher number is called the systolic pressure. It is a measure of the pressure in your arteries as the heart beats. The second, lower number is called the diastolic pressure. It is a measure of the pressure in your arteries as the heart relaxes between beats.  Keeping your blood pressure in a normal range is important to your overall health and prevention of health problems, such as heart disease and stroke. When your blood pressure is uncontrolled, your heart has to work harder than normal. High blood pressure is a very common condition in adults because blood pressure tends to rise with age. Men and women are equally likely to have hypertension but at different times in life. Before age 46, men are more likely to have hypertension. After 46 years of age, women are more likely to have it. Hypertension is especially common in African Americans. This condition often has no signs or symptoms. The cause of the condition is usually not known. Your caregiver can help you come up with a plan to keep your blood pressure in a normal, healthy range. BLOOD PRESSURE STAGES Blood pressure is classified into four stages: normal, prehypertension, stage 1, and stage 2. Your blood pressure  reading will be used to determine what type of treatment, if any, is necessary. Appropriate treatment options are tied to these four stages:  Normal  Systolic pressure (mm Hg): below 120.  Diastolic pressure (mm Hg): below 80. Prehypertension  Systolic pressure (mm Hg): 120 to 139.  Diastolic pressure (mm Hg): 80 to 89. Stage1  Systolic pressure (mm Hg): 140 to 159.  Diastolic pressure (mm Hg): 90 to 99. Stage2  Systolic pressure (mm Hg): 160 or above.  Diastolic pressure (mm Hg): 100  or above. RISKS RELATED TO HIGH BLOOD PRESSURE Managing your blood pressure is an important responsibility. Uncontrolled high blood pressure can lead to:  A heart attack.  A stroke.  A weakened blood vessel (aneurysm).  Heart failure.  Kidney damage.  Eye damage.  Metabolic syndrome.  Memory and concentration problems. HOW TO MANAGE YOUR BLOOD PRESSURE Blood pressure can be managed effectively with lifestyle changes and medicines (if needed). Your caregiver will help you come up with a plan to bring your blood pressure within a normal range. Your plan should include the following: Education  Read all information provided by your caregivers about how to control blood pressure.  Educate yourself on the latest guidelines and treatment recommendations. New research is always being done to further define the risks and treatments for high blood pressure. Lifestylechanges  Control your weight.  Avoid smoking.  Stay physically active.  Reduce the amount of salt in your diet.  Reduce stress.  Control any chronic conditions, such as high cholesterol or diabetes.  Reduce your alcohol intake. Medicines  Several medicines (antihypertensive medicines) are available, if needed, to bring blood pressure within a normal range. Communication  Review all the medicines you take with your caregiver because there may be side effects or interactions.  Talk with your caregiver about your diet, exercise habits, and other lifestyle factors that may be contributing to high blood pressure.  See your caregiver regularly. Your caregiver can help you create and adjust your plan for managing high blood pressure. RECOMMENDATIONS FOR TREATMENT AND FOLLOW-UP  The following recommendations are based on current guidelines for managing high blood pressure in nonpregnant adults. Use these recommendations to identify the proper follow-up period or treatment option based on your blood pressure reading.  You can discuss these options with your caregiver.  Systolic pressure of 120 to 139 or diastolic pressure of 80 to 89: Follow up with your caregiver as directed.  Systolic pressure of 140 to 160 or diastolic pressure of 90 to 100: Follow up with your caregiver within 2 months.  Systolic pressure above 160 or diastolic pressure above 100: Follow up with your caregiver within 1 month.  Systolic pressure above 180 or diastolic pressure above 110: Consider antihypertensive therapy; follow up with your caregiver within 1 week.  Systolic pressure above 200 or diastolic pressure above 120: Begin antihypertensive therapy; follow up with your caregiver within 1 week.   This information is not intended to replace advice given to you by your health care provider. Make sure you discuss any questions you have with your health care provider.   Document Released: 12/14/2011 Document Reviewed: 12/14/2011 Elsevier Interactive Patient Education [employer]

## 2016-07-08 DIAGNOSIS — Z79899 Other long term (current) drug therapy: Secondary | ICD-10-CM | POA: Diagnosis not present

## 2016-07-08 DIAGNOSIS — K219 Gastro-esophageal reflux disease without esophagitis: Secondary | ICD-10-CM | POA: Diagnosis not present

## 2016-07-08 DIAGNOSIS — I10 Essential (primary) hypertension: Secondary | ICD-10-CM | POA: Diagnosis not present

## 2016-07-22 DIAGNOSIS — I10 Essential (primary) hypertension: Secondary | ICD-10-CM | POA: Diagnosis not present

## 2016-07-22 DIAGNOSIS — R946 Abnormal results of thyroid function studies: Secondary | ICD-10-CM | POA: Diagnosis not present

## 2016-07-22 DIAGNOSIS — K219 Gastro-esophageal reflux disease without esophagitis: Secondary | ICD-10-CM | POA: Diagnosis not present

## 2016-08-10 ENCOUNTER — Emergency Department (HOSPITAL_COMMUNITY)
Admission: EM | Admit: 2016-08-10 | Discharge: 2016-08-10 | Disposition: A | Discharge status: Home / Self Care | Payer: 59 | Attending: Emergency Medicine | Admitting: Emergency Medicine

## 2016-08-10 ENCOUNTER — Emergency Department (HOSPITAL_COMMUNITY): Payer: 59

## 2016-08-10 ENCOUNTER — Encounter (HOSPITAL_COMMUNITY): Payer: Self-pay

## 2016-08-10 DIAGNOSIS — R5383 Other fatigue: Secondary | ICD-10-CM | POA: Diagnosis not present

## 2016-08-10 DIAGNOSIS — R531 Weakness: Secondary | ICD-10-CM | POA: Diagnosis not present

## 2016-08-10 DIAGNOSIS — F17200 Nicotine dependence, unspecified, uncomplicated: Secondary | ICD-10-CM | POA: Insufficient documentation

## 2016-08-10 DIAGNOSIS — I10 Essential (primary) hypertension: Secondary | ICD-10-CM | POA: Diagnosis not present

## 2016-08-10 DIAGNOSIS — Z79899 Other long term (current) drug therapy: Secondary | ICD-10-CM | POA: Diagnosis not present

## 2016-08-10 DIAGNOSIS — R404 Transient alteration of awareness: Secondary | ICD-10-CM | POA: Diagnosis not present

## 2016-08-10 DIAGNOSIS — I517 Cardiomegaly: Secondary | ICD-10-CM | POA: Diagnosis not present

## 2016-08-10 LAB — URINALYSIS, ROUTINE W REFLEX MICROSCOPIC
BACTERIA UA: NONE SEEN
BILIRUBIN URINE: NEGATIVE
Glucose, UA: NEGATIVE mg/dL
Hgb urine dipstick: NEGATIVE
KETONES UR: NEGATIVE mg/dL
Nitrite: NEGATIVE
PROTEIN: NEGATIVE mg/dL
Specific Gravity, Urine: 1.008 (ref 1.005–1.030)
pH: 6 (ref 5.0–8.0)

## 2016-08-10 LAB — CBC
HCT: 40.3 % (ref 36.0–46.0)
HEMOGLOBIN: 12.8 g/dL (ref 12.0–15.0)
MCH: 25.8 pg — ABNORMAL LOW (ref 26.0–34.0)
MCHC: 31.8 g/dL (ref 30.0–36.0)
MCV: 81.3 fL (ref 78.0–100.0)
Platelets: 260 10*3/uL (ref 150–400)
RBC: 4.96 MIL/uL (ref 3.87–5.11)
RDW: 13.3 % (ref 11.5–15.5)
WBC: 7.6 10*3/uL (ref 4.0–10.5)

## 2016-08-10 LAB — BASIC METABOLIC PANEL
ANION GAP: 8 (ref 5–15)
BUN: 7 mg/dL (ref 6–20)
CALCIUM: 9 mg/dL (ref 8.9–10.3)
CO2: 22 mmol/L (ref 22–32)
Chloride: 108 mmol/L (ref 101–111)
Creatinine, Ser: 0.62 mg/dL (ref 0.44–1.00)
GLUCOSE: 83 mg/dL (ref 65–99)
POTASSIUM: 3.4 mmol/L — AB (ref 3.5–5.1)
SODIUM: 138 mmol/L (ref 135–145)

## 2016-08-10 LAB — CBG MONITORING, ED: Glucose-Capillary: 82 mg/dL (ref 65–99)

## 2016-08-10 LAB — T4, FREE: FREE T4: 0.66 ng/dL (ref 0.61–1.12)

## 2016-08-10 LAB — TSH: TSH: 0.385 u[IU]/mL (ref 0.350–4.500)

## 2016-08-10 IMAGING — DX DG CHEST 2V
2 series · 2 of 2 positions shown · non-contrast
Comparison: [DATE]

CLINICAL DATA: Weakness for 2 months.

EXAM:
CHEST  2 VIEW

[chest lat]
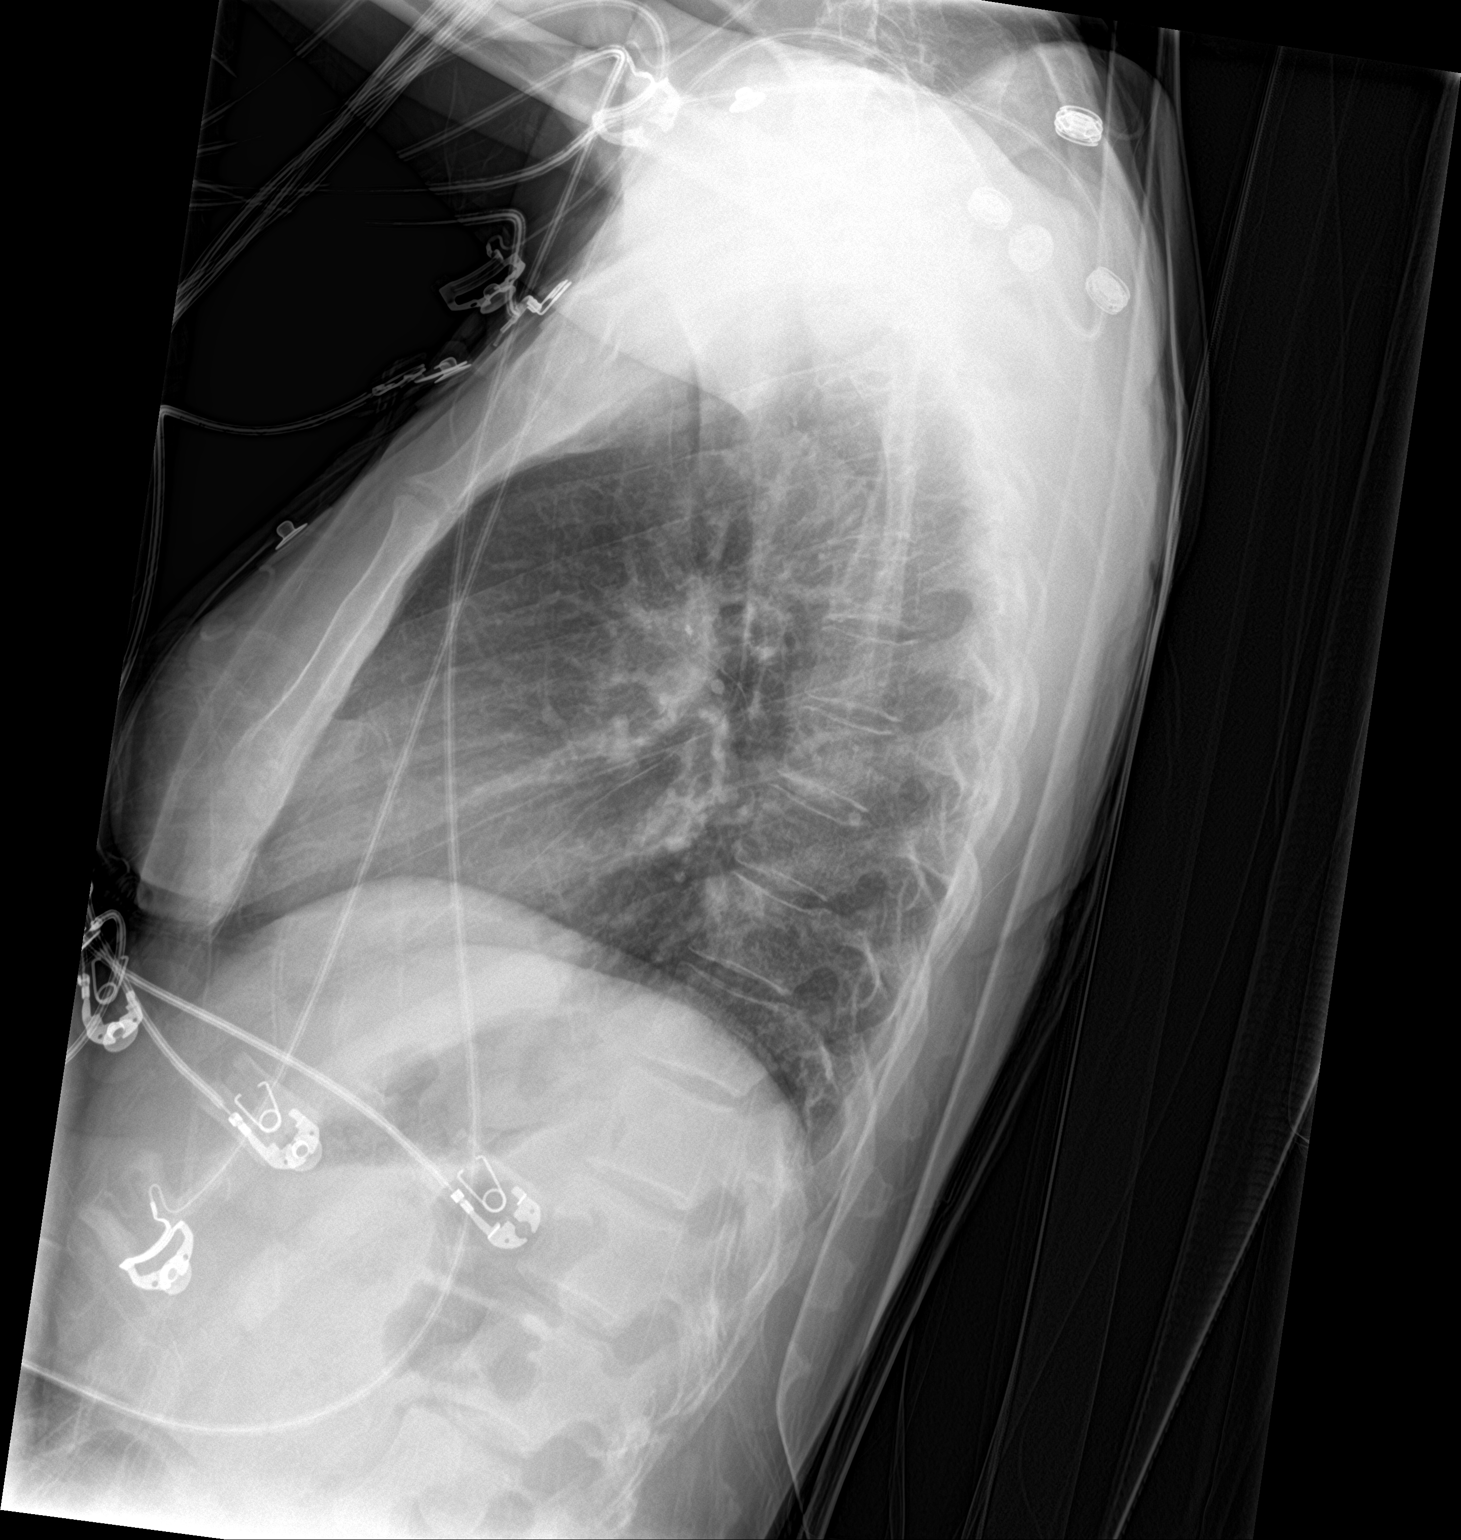

[chest ap]
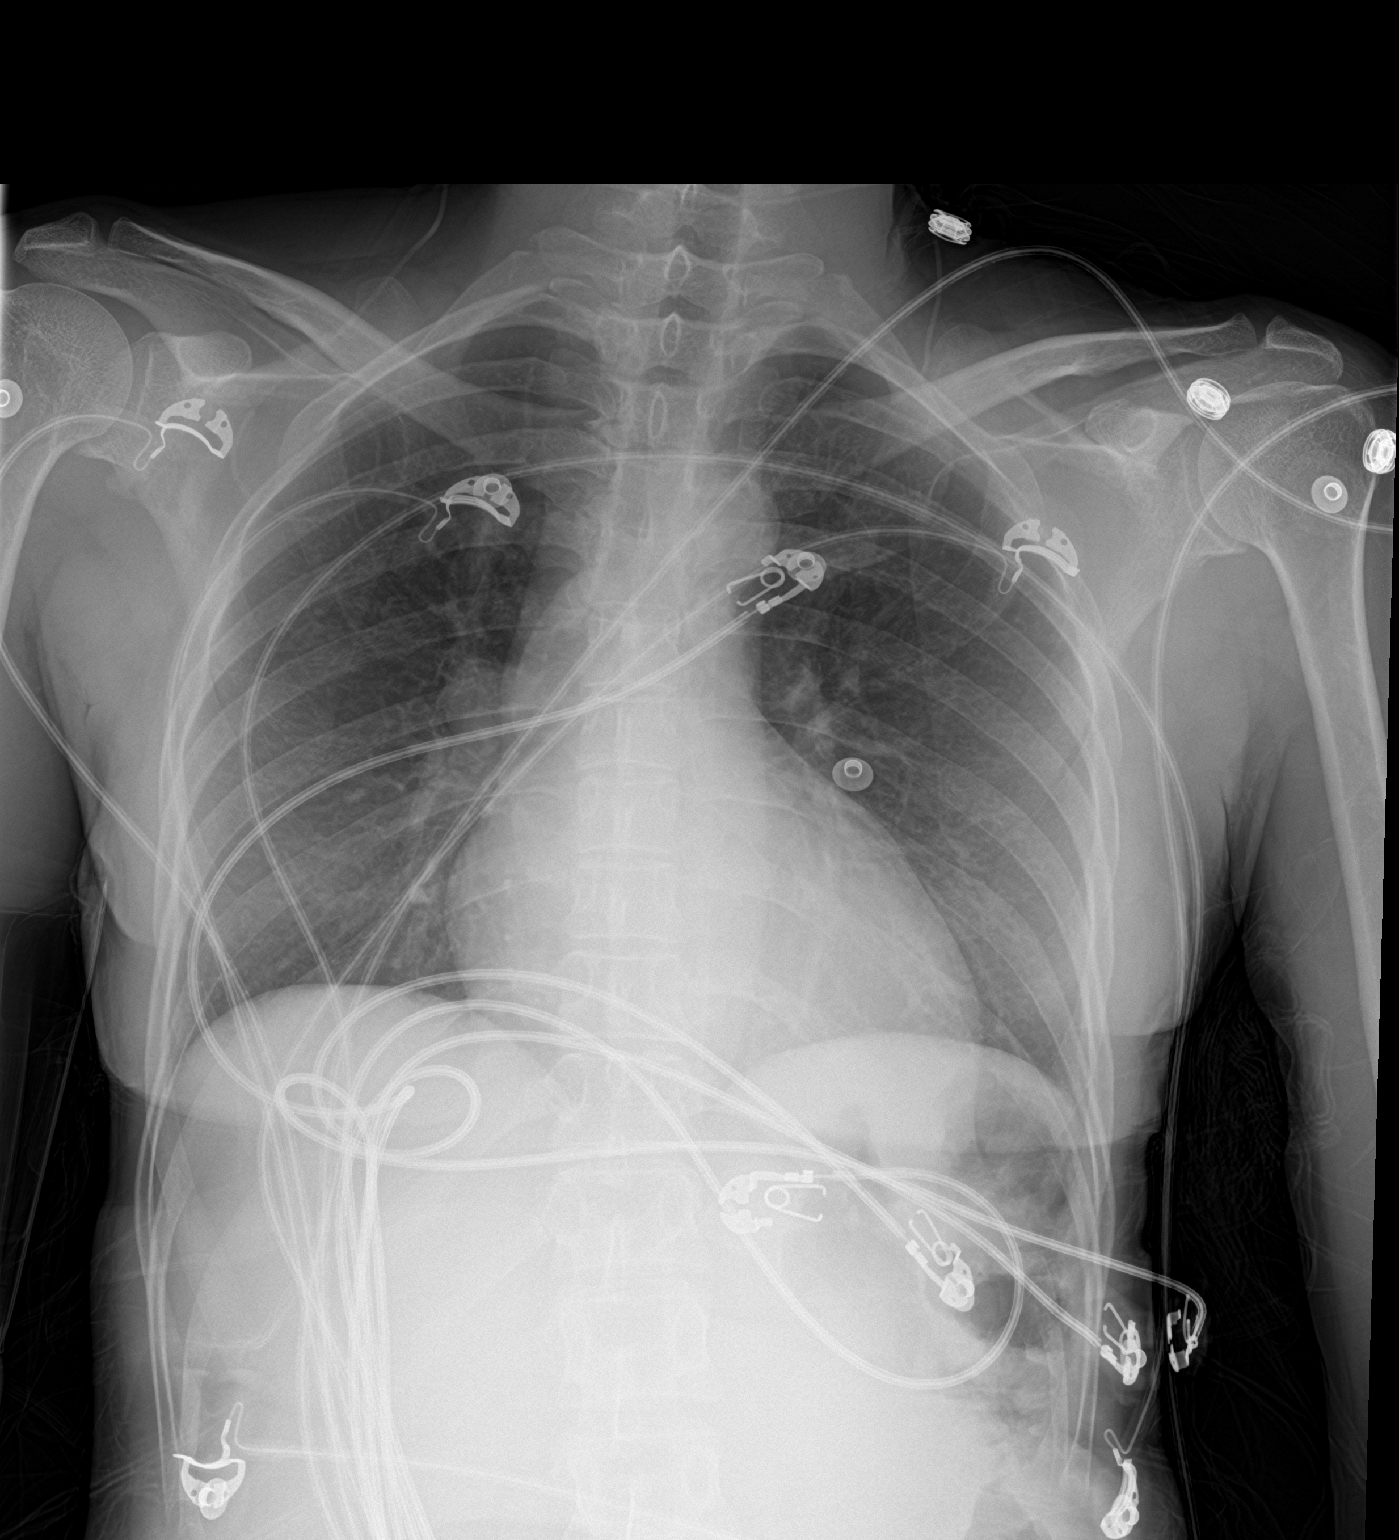

[2 of 2 positions shown; findings below may reference images not displayed]

FINDINGS: The cardiac silhouette is mildly enlarged. Slight interstitial
prominence may reflect very mild pulmonary vascular congestion.
There is no evidence of overt pulmonary edema, consolidation
suggestive of pneumonia, pleural effusion, or pneumothorax. No acute
osseous abnormality is seen.
IMPRESSION: Cardiomegaly without evidence of acute airspace disease.

## 2016-08-10 NOTE — ED Provider Notes (Signed)
[facility]-EMERGENCY DEPT Provider Note   CSN: [id number] Arrival date & time: 08/10/16  1131     History   Chief Complaint Chief Complaint  Patient presents with  . Fatigue    HPI Leah Rose is a 46 y.o. female.  HPI Patient presents with fatigue and dizziness. Has had it for months. States she's been seen at her old primary care doctor. States it started on blood pressure medicine. Has recently started on lisinopril. Reportedly had her thyroid checked and was abnormal. Occasional headaches. States she has had history of headaches in his typical headaches for her. No localizing numbness or weakness. No fevers or chills. States she has not been able put on weight but has not been losing weight.   Past Medical History:  Diagnosis Date  . Depression   . GERD (gastroesophageal reflux disease)   . Headache(784.0)   . Hypertension   . Insomnia   . Tobacco user     Patient Active Problem List   Diagnosis Date Noted  . HORDEOLUM EXTERNUM 04/11/2008  . DENTAL PAIN 01/07/2008  . DYSPNEA 08/09/2007  . CONJUNCTIVITIS, ACUTE 07/03/2007  . SINUS TACHYCARDIA 07/03/2007  . DIZZINESS, CHRONIC 06/26/2007  . FATIGUE 07/13/2006  . HEADACHE 07/13/2006  . DYSURIA 07/13/2006  . GERD 03/21/2006  . TOBACCO ABUSE 03/14/2006  . DEPRESSION 03/14/2006  . HYPERTENSION 03/14/2006  . INSOMNIA 03/14/2006    History reviewed. No pertinent surgical history.  OB History    No data available       Home Medications    Prior to Admission medications   Medication Sig Start Date End Date Taking? Authorizing Provider  lisinopril (PRINIVIL,ZESTRIL) 40 MG tablet Take 40 mg by mouth daily.     Yes [provider]  dicyclomine (BENTYL) 20 MG tablet Take 1 tablet (20 mg total) by mouth 2 (two) times daily. Patient not taking: Reported on 08/10/2016 08/26/15   Tharon Aquas, PA  ondansetron (ZOFRAN) 4 MG tablet Take 1 tablet (4 mg total) by mouth every 8 (eight) hours as needed for  nausea. Patient not taking: Reported on 08/10/2016 04/30/14   Charm Rings, MD    Family History Family History  Problem Relation Age of Onset  . Seizures Mother     Social History Social History  Substance Use Topics  . Smoking status: Current Every Day Smoker    Packs/day: 1.00  . Smokeless tobacco: Never Used     Comment: ready to quit  . Alcohol use Yes     Comment: occasional     Allergies   Penicillins and Sulfonamide derivatives   Review of Systems Review of Systems  Constitutional: Positive for fatigue. Negative for appetite change and fever.  HENT: Negative for congestion.   Respiratory: Negative for shortness of breath.   Cardiovascular: Negative for chest pain.  Gastrointestinal: Negative for abdominal pain.  Genitourinary: Negative for flank pain.  Neurological: Positive for weakness and headaches. Negative for numbness.  Hematological: Negative for adenopathy.  Psychiatric/Behavioral: Negative for confusion.     Physical Exam Updated Vital Signs BP (!) 152/103   Pulse 93   Temp 98.6 F (37 C) (Oral)   Resp (!) 27   Ht 4\' 11"  (1.499 m)   Wt 92 lb (41.7 kg)   SpO2 100%   BMI 18.58 kg/m   Physical Exam  Constitutional: She is oriented to person, place, and time. She appears well-developed.  HENT:  Head: Atraumatic.  Eyes: EOM are normal.  Neck: Neck supple.  Cardiovascular: Normal rate.   Pulmonary/Chest: Effort normal.  Abdominal: Soft.  Musculoskeletal: Normal range of motion.  Neurological: She is alert and oriented to person, place, and time.  Skin: Skin is warm. Capillary refill takes less than 2 seconds.  Psychiatric: She has a normal mood and affect.     ED Treatments / Results  Labs (all labs ordered are listed, but only abnormal results are displayed) Labs Reviewed  BASIC METABOLIC PANEL - Abnormal; Notable for the following:       Result Value   Potassium 3.4 (*)    All other components within normal limits  CBC -  Abnormal; Notable for the following:    MCH 25.8 (*)    All other components within normal limits  T4, FREE  TSH  URINALYSIS, ROUTINE W REFLEX MICROSCOPIC  T3, FREE  CBG MONITORING, ED    EKG  EKG Interpretation  Date/Time:  Wednesday Aug 10 2016 12:14:55 EDT Ventricular Rate:  74 PR Interval:    QRS Duration: 83 QT Interval:  400 QTC Calculation: 444 R Axis:   139 Text Interpretation:  Sinus rhythm Confirmed by Rubin PayorPICKERING  MD, Gizell Danser [phone]([id number]) on 08/10/2016 3:47:14 PM       Radiology Dg Chest 2 View  Result Date: 08/10/2016 CLINICAL DATA:  Weakness for 2 months. EXAM: CHEST  2 VIEW COMPARISON:  08/09/2007 FINDINGS: The cardiac silhouette is mildly enlarged. Slight interstitial prominence may reflect very mild pulmonary vascular congestion. There is no evidence of overt pulmonary edema, consolidation suggestive of pneumonia, pleural effusion, or pneumothorax. No acute osseous abnormality is seen. IMPRESSION: Cardiomegaly without evidence of acute airspace disease. Electronically Signed   By: Sebastian AcheAllen  Grady M.D.   On: 08/10/2016 14:27    Procedures Procedures (including critical care time)  Medications Ordered in ED Medications - No data to display   Initial Impression / Assessment and Plan / ED Course  I have reviewed the triage vital signs and the nursing notes.  Pertinent labs & imaging results that were available during my care of the patient were reviewed by me and considered in my medical decision making (see chart for details).     Patient with fatigue. History of same over last few months. Has been worked up by primary care doctor. States she is going to a new primary care doctor now due to insurance issues. Labs reassuring. Will discharge home to follow-up with her new primary care doctor.  Final Clinical Impressions(s) / ED Diagnoses   Final diagnoses:  Fatigue, unspecified type    New Prescriptions New Prescriptions   No medications on file     Benjiman CorePickering,  Elivia Robotham, MD 08/10/16 1547

## 2016-08-10 NOTE — ED Notes (Signed)
MD Pickering at the bedside 

## 2016-08-10 NOTE — Discharge Instructions (Signed)
Follow-up with your new doctor for further evaluation.

## 2016-08-10 NOTE — ED Triage Notes (Addendum)
Per [facility], pt is coming from home with complaints of generalized fatigue and dizzines for multiple months. Pt reports being out of HTN medication for a year and recently being placed on Lisinopril 3 weeks ago. Pt denies confusion, aphasia, numbness or tingling on one side. Reports headache. Pt had thyroid checked with note of abnormal levels. Vitals per EMS: 194/124, 80 HR, 106 CBG.

## 2016-08-11 LAB — T3, FREE: T3 FREE: 2.4 pg/mL (ref 2.0–4.4)

## 2016-08-12 ENCOUNTER — Ambulatory Visit (INDEPENDENT_AMBULATORY_CARE_PROVIDER_SITE_OTHER): Payer: 59 | Admitting: Internal Medicine

## 2016-08-12 ENCOUNTER — Encounter: Payer: Self-pay | Admitting: Internal Medicine

## 2016-08-12 VITALS — BP 125/93 | HR 106 | Temp 98.7°F | Wt 88.6 lb

## 2016-08-12 DIAGNOSIS — I10 Essential (primary) hypertension: Secondary | ICD-10-CM | POA: Diagnosis not present

## 2016-08-12 DIAGNOSIS — Z882 Allergy status to sulfonamides status: Secondary | ICD-10-CM | POA: Diagnosis not present

## 2016-08-12 DIAGNOSIS — F17210 Nicotine dependence, cigarettes, uncomplicated: Secondary | ICD-10-CM

## 2016-08-12 DIAGNOSIS — Z8379 Family history of other diseases of the digestive system: Secondary | ICD-10-CM | POA: Diagnosis not present

## 2016-08-12 DIAGNOSIS — Z79899 Other long term (current) drug therapy: Secondary | ICD-10-CM

## 2016-08-12 DIAGNOSIS — Z880 Allergy status to penicillin: Secondary | ICD-10-CM

## 2016-08-12 DIAGNOSIS — Z8249 Family history of ischemic heart disease and other diseases of the circulatory system: Secondary | ICD-10-CM

## 2016-08-12 DIAGNOSIS — F339 Major depressive disorder, recurrent, unspecified: Secondary | ICD-10-CM

## 2016-08-12 DIAGNOSIS — Z803 Family history of malignant neoplasm of breast: Secondary | ICD-10-CM | POA: Diagnosis not present

## 2016-08-12 DIAGNOSIS — Z820 Family history of epilepsy and other diseases of the nervous system: Secondary | ICD-10-CM | POA: Diagnosis not present

## 2016-08-12 DIAGNOSIS — F331 Major depressive disorder, recurrent, moderate: Secondary | ICD-10-CM

## 2016-08-12 DIAGNOSIS — Z0000 Encounter for general adult medical examination without abnormal findings: Secondary | ICD-10-CM

## 2016-08-12 MED ORDER — FLUOXETINE HCL 20 MG PO CAPS: 20.0000 mg | ORAL_CAPSULE | Freq: Every day | ORAL | 0 refills | Status: DC

## 2016-08-12 MED ORDER — LISINOPRIL 20 MG PO TABS: 20.0000 mg | ORAL_TABLET | Freq: Every day | ORAL | 3 refills | Status: DC

## 2016-08-12 NOTE — Progress Notes (Signed)
   CC: Weight loss and Fatigue  HPI: Ms. Leah NapoleonKisha Ewy is a 46 y.o. female with a h/o of hypertension and major depression who presents with fatigue, weight loss and loss of appetite.  Medical Hx The patient has a past medical history of hypertension and depression. Social Hx She is currently smoking approximately 1 pack of cigarettes per day. Denies illicit drug use. Drinks socially. Family Hx The patient reports a family history of hypertension, seizures, cirrhosis and breast cancer. Allergies to Medication The patient reports allergies to penicillin and sulfur drugs Review of Systems: Outside of what is documented above the patient denies chest pain or shortness of breath. She denies nausea, vomiting or abdominal pain. She denies polyuria or dysuria. She denies constipation. She denies night sweats.  Physical Exam: Vitals:   08/12/16 1319  BP: (!) 125/93  Pulse: (!) 106  Temp: 98.7 F (37.1 C)  TempSrc: Oral  SpO2: 100%  Weight: 88 lb 9.6 oz (40.2 kg)   BP (!) 125/93 (BP Location: Left Arm, Patient Position: Sitting, Cuff Size: Small)   Pulse (!) 106   Temp 98.7 F (37.1 C) (Oral)   Wt 88 lb 9.6 oz (40.2 kg)   SpO2 100%   BMI 17.90 kg/m  General appearance: alert and cooperative Head: Normocephalic, without obvious abnormality, atraumatic Lungs: clear to auscultation bilaterally Heart: Tachycardic, regular rhythm, no murmurs rubs or gallops Abdomen: soft, non-tender; bowel sounds normal; no masses,  no organomegaly Extremities: extremities normal, atraumatic, no cyanosis or edema  Assessment & Plan:  See encounters tab for problem based medical decision making. Patient discussed with Dr. Cleda DaubE. Hoffman  Signed: Thomasene Lotaylor, Abbagail Scaff, MD 08/12/2016, 1:25 PM  Pager: [phone]

## 2016-08-12 NOTE — Assessment & Plan Note (Addendum)
Today the patient endorses a worsening history of fatigue, weight loss, loss of appetite and worsening irritability. She says this has been going on for several months. She has a past history of major depression. She recently presented to the emergency department with a chief complaint of fatigue and laboratory evaluation at that time was normal including a normal TSH, T4 and T3. Her symptoms do concern me for ongoing major depression which is currently untreated. When I asked about her mood she broke down and became extremely tearful and says she has been depressed for some time. She states sometimes she does not feel like getting out of bed and doing anything. She has no appetite or energy. She says she has been dealing with depression for many years and wants to get treatment. I discussed starting an SSRI and she was amenable to this solution. We also discussed the potential of referral to therapy and she wants some time to think about this. She denies suicidal or homicidal ideation. I do think many of her complaints could be explained by ongoing depression. For now I will start fluoxetine and have the patient return to clinic in one month for ongoing symptom evaluation. -- Fluoxetine 20 mg once daily -- Return to clinic in one month for ongoing evaluation -- Please reevaluate her depressive symptoms when she returns to clinic in 1 month, make any adjustments to her medication as needed at that time

## 2016-08-12 NOTE — Assessment & Plan Note (Signed)
Patient overdue for a mammogram. I placed a referral. She says [facility] -- Referral for mammography

## 2016-08-12 NOTE — Assessment & Plan Note (Signed)
The patient has a history of essential hypertension. Currently, she is on lisinopril 20 mg once daily. Blood pressure in the office today 125/93. She is currently at goal. I will not change her antihypertensive medication regimen at this time. We will recheck her blood pressure in one month when she returns to clinic. -- Continue lisinopril 20 mg once daily

## 2016-08-12 NOTE — Patient Instructions (Signed)
It was a pleasure seeing you today. [facility]   I have started a medication today called Prozac. You're to take 1 pill once daily for the treatment of your depression. It may take 2-3 weeks before you notice any effect of this medicine. Please return to clinic in one month for ongoing evaluation of your symptoms and to make any necessary changes to your medicine.

## 2016-08-16 NOTE — Progress Notes (Signed)
Internal Medicine Clinic Attending  Case discussed with Dr. Taylor at the time of the visit.  We reviewed the resident's history and exam and pertinent patient test results.  I agree with the assessment, diagnosis, and plan of care documented in the resident's note. 

## 2016-08-16 NOTE — Addendum Note (Signed)
Addended by: Carlynn PurlHOFFMAN, Prestina Raigoza C on: 08/16/2016 02:47 PM   Modules accepted: Level of Service

## 2016-09-09 ENCOUNTER — Ambulatory Visit: Payer: 59

## 2016-09-16 ENCOUNTER — Ambulatory Visit (INDEPENDENT_AMBULATORY_CARE_PROVIDER_SITE_OTHER): Payer: 59 | Admitting: Internal Medicine

## 2016-09-16 ENCOUNTER — Encounter: Payer: Self-pay | Admitting: Internal Medicine

## 2016-09-16 VITALS — BP 138/92 | HR 86 | Temp 98.1°F | Ht 59.0 in | Wt 93.1 lb

## 2016-09-16 DIAGNOSIS — F331 Major depressive disorder, recurrent, moderate: Secondary | ICD-10-CM | POA: Diagnosis not present

## 2016-09-16 DIAGNOSIS — Z716 Tobacco abuse counseling: Secondary | ICD-10-CM | POA: Diagnosis not present

## 2016-09-16 DIAGNOSIS — Z1231 Encounter for screening mammogram for malignant neoplasm of breast: Secondary | ICD-10-CM

## 2016-09-16 DIAGNOSIS — Z23 Encounter for immunization: Secondary | ICD-10-CM

## 2016-09-16 DIAGNOSIS — Z79899 Other long term (current) drug therapy: Secondary | ICD-10-CM

## 2016-09-16 DIAGNOSIS — Z0000 Encounter for general adult medical examination without abnormal findings: Secondary | ICD-10-CM

## 2016-09-16 DIAGNOSIS — I10 Essential (primary) hypertension: Secondary | ICD-10-CM

## 2016-09-16 DIAGNOSIS — F17210 Nicotine dependence, cigarettes, uncomplicated: Secondary | ICD-10-CM | POA: Diagnosis not present

## 2016-09-16 DIAGNOSIS — Z720 Tobacco use: Secondary | ICD-10-CM

## 2016-09-16 MED ORDER — NICOTINE 7 MG/24HR TD PT24: 7.0000 mg | MEDICATED_PATCH | TRANSDERMAL | 0 refills | Status: DC

## 2016-09-16 MED ORDER — MIRTAZAPINE 15 MG PO TABS: 15.0000 mg | ORAL_TABLET | Freq: Every day | ORAL | 3 refills | Status: DC

## 2016-09-16 MED ORDER — NICOTINE 14 MG/24HR TD PT24: 14.0000 mg | MEDICATED_PATCH | TRANSDERMAL | 0 refills | Status: DC

## 2016-09-16 MED ORDER — NICOTINE 21 MG/24HR TD PT24: 21.0000 mg | MEDICATED_PATCH | TRANSDERMAL | 0 refills | Status: DC

## 2016-09-16 NOTE — Assessment & Plan Note (Signed)
Assessment  Her PHQ-9 score today was 16 would suggest she's got moderate depression while on Prozac 20 mg by mouth daily. She states that the Prozac has been helpful although she remains depressed with fatigue, decreased appetite, and easy irritability. Of these symptoms the decreased appetite is the most distressing to her. In the past she has been on Wellbutrin, Celebrex, trazodone, and mirtazapine.  Plan  Given the distressing nature of her decreased appetite we decided to retry the mirtazapine at 15 mg by mouth at bedtime. We called her insurance company and this is on her formulary so she should be able to afford this medication. We stopped the Prozac, but I asked her not to throw it away just yet as we assess the efficacy of the mirtazapine first. If the mirtazapine is ineffective in managing her depression via monitoring of the PHQ-9, we will consider restarting the fluoxetine at 40 mg by mouth daily.

## 2016-09-16 NOTE — Progress Notes (Signed)
   Subjective:    Patient ID: Leah Rose, female    DOB: December 03, 1970, 46 y.o.   MRN: [id number]  HPI  Leah Rose is here for follow-up of her depression, essential hypertension, and tobacco abuse. Please see the A&P for the status of the pt's chronic medical problems.  Review of Systems  Constitutional: Positive for diaphoresis and fatigue. Negative for activity change, appetite change, fever and unexpected weight change.  Respiratory: Negative for cough, chest tightness, shortness of breath and wheezing.   Cardiovascular: Negative for chest pain, palpitations and leg swelling.  Musculoskeletal: Negative for arthralgias, back pain, gait problem, joint swelling and myalgias.  Skin: Negative for rash and wound.  Neurological: Positive for headaches. Negative for seizures, syncope and weakness.       Occasional occipital headaches  Psychiatric/Behavioral: Positive for agitation and dysphoric mood. Negative for sleep disturbance.      Objective:   Physical Exam  Constitutional: She is oriented to person, place, and time. She appears well-developed. No distress.  HENT:  Head: Normocephalic and atraumatic.  Eyes: Conjunctivae are normal. Right eye exhibits no discharge. Left eye exhibits no discharge. No scleral icterus.  Musculoskeletal: Normal range of motion. She exhibits no edema, tenderness or deformity.  Neurological: She is alert and oriented to person, place, and time. She exhibits normal muscle tone.  Skin: Skin is warm and dry. No rash noted. She is not diaphoretic. No erythema.  Psychiatric: She has a normal mood and affect. Her behavior is normal. Judgment and thought content normal.  Nursing note and vitals reviewed.     Assessment & Plan:   Please see problem based charting.

## 2016-09-16 NOTE — Patient Instructions (Signed)
It was nice to meet you.  I look forward to taking care of you for years to come.  1) Stop the Prozac and start Remeron 15 mg at night for depression and appetite.  2) Take lisinopril 20 mg daily for blood pressure as you are.  3) [facility].  4) We will try to get mammogram scheduled.  5) Let me know if you want me to prescribe nicotine patches.  6) We gave you a tetanus booster today.  I will see you back in 3-4 months and finish the medical history, sooner if necessary.

## 2016-09-16 NOTE — Assessment & Plan Note (Signed)
Assessment  She is mentally ready to quit smoking. We spent 5 minutes discussing the health benefits and detriments associated with tobacco abuse. Initially, she was interested in retrying the nicotine patches as this had been effective in the past. At the end of the visit she decided she would try smoking cessation on her own without the nicotine patches.  Plan  She was praised for her desire to quit smoking. I stressed that if she is unable to quit on her own that she can call the clinic and I would be more than happy to prescribe the nicotine patches via the phone at that time. We will reassess her success at tobacco cessation at the follow-up visit. Wellbutrin is another medication that could be considered in the future to assist with tobacco cessation.

## 2016-09-16 NOTE — Assessment & Plan Note (Signed)
Assessment  Her blood pressure was minimally elevated today at 138/92. This is on lisinopril 20 mg by mouth daily. She is tolerating this medication well. She is interested in tobacco cessation and if effective this may be enough to lower her blood pressure into the target range.  Plan  We will continue the lisinopril 20 mg by mouth daily and reassess the blood pressure if she is able to achieve tobacco cessation. If her blood pressure is not at target at the follow-up visit we will increase the lisinopril to 40 mg by mouth daily.

## 2016-09-16 NOTE — Assessment & Plan Note (Signed)
She was given a tetanus-diphtheria immunization today. She was not interested in having her HIV status checked, but will consider having it drawn at the follow-up visit. She is interested in a mammogram and I have placed this referral. She is otherwise up-to-date on her health care maintenance in that she has [facility]. Of note, she apparently is getting Depo shots through the women's clinic and I am hopeful I can get the specifics of the medication and dose between now and her follow-up visit. I will also complete the medical, surgical, sexual, and tobacco history at the follow-up visit.

## 2016-09-21 DIAGNOSIS — Z01419 Encounter for gynecological examination (general) (routine) without abnormal findings: Secondary | ICD-10-CM | POA: Diagnosis not present

## 2017-06-23 ENCOUNTER — Other Ambulatory Visit: Payer: Self-pay

## 2017-06-23 ENCOUNTER — Ambulatory Visit (INDEPENDENT_AMBULATORY_CARE_PROVIDER_SITE_OTHER): Payer: 59 | Admitting: Internal Medicine

## 2017-06-23 ENCOUNTER — Encounter: Payer: Self-pay | Admitting: Internal Medicine

## 2017-06-23 VITALS — BP 173/97 | HR 82 | Temp 98.1°F | Ht 59.0 in | Wt 98.1 lb

## 2017-06-23 DIAGNOSIS — Z720 Tobacco use: Secondary | ICD-10-CM

## 2017-06-23 DIAGNOSIS — I872 Venous insufficiency (chronic) (peripheral): Secondary | ICD-10-CM | POA: Diagnosis not present

## 2017-06-23 DIAGNOSIS — Z79899 Other long term (current) drug therapy: Secondary | ICD-10-CM | POA: Diagnosis not present

## 2017-06-23 DIAGNOSIS — F17200 Nicotine dependence, unspecified, uncomplicated: Secondary | ICD-10-CM

## 2017-06-23 DIAGNOSIS — M7918 Myalgia, other site: Secondary | ICD-10-CM

## 2017-06-23 DIAGNOSIS — R238 Other skin changes: Secondary | ICD-10-CM

## 2017-06-23 DIAGNOSIS — I10 Essential (primary) hypertension: Secondary | ICD-10-CM

## 2017-06-23 DIAGNOSIS — Z791 Long term (current) use of non-steroidal anti-inflammatories (NSAID): Secondary | ICD-10-CM | POA: Diagnosis not present

## 2017-06-23 DIAGNOSIS — F331 Major depressive disorder, recurrent, moderate: Secondary | ICD-10-CM | POA: Diagnosis not present

## 2017-06-23 DIAGNOSIS — M791 Myalgia, unspecified site: Secondary | ICD-10-CM

## 2017-06-23 HISTORY — DX: Venous insufficiency (chronic) (peripheral): I87.2

## 2017-06-23 MED ORDER — MELOXICAM 15 MG PO TABS: 15.0000 mg | ORAL_TABLET | Freq: Every day | ORAL | 3 refills | Status: DC

## 2017-06-23 MED ORDER — MIRTAZAPINE 30 MG PO TABS: 30.0000 mg | ORAL_TABLET | Freq: Every day | ORAL | 3 refills | Status: DC

## 2017-06-23 MED ORDER — LISINOPRIL 40 MG PO TABS: 40.0000 mg | ORAL_TABLET | Freq: Every day | ORAL | 3 refills | Status: DC

## 2017-06-23 NOTE — Assessment & Plan Note (Signed)
Assessment  She notes feeling down, having anhedonia, and a markedly decreased appetite. She admits that all of these are likely related to her depression and states she has not been taking the mirtazapine recently.  Plan  We will restart the mirtazapine and increased to 30 mg by mouth daily. We will reassess the efficacy of this therapy in controlling her depressive symptoms at the follow-up visit.

## 2017-06-23 NOTE — Assessment & Plan Note (Signed)
Assessment  She states she is not ready mentally to quit smoking. She remains in the pre-contemplative stage because of her depression. She thinks that with better control of her depression she may be ready to quit at the follow-up visit.  Plan  We will aggressively treat her depression as noted. We will reassess her readiness to quit smoking at the follow-up visit. If she is interested, at that time, she may be a candidate for either nicotine patches or Wellbutrin.

## 2017-06-23 NOTE — Assessment & Plan Note (Signed)
[facility]. At that point she states she will undergo a Pap smear and will remind them to order the mammogram. If they fail to order a mammogram I will place the order at the follow-up visit. Upon her return visit we will finish with her medical, surgical, sexual, and tobacco history.

## 2017-06-23 NOTE — Progress Notes (Signed)
   Subjective:    Patient ID: Leah Rose, female    DOB: 05-Aug-1970, 47 y.o.   MRN: [id number]  HPI  Leah Rose is here for follow-up of her moderate recurrent major depression, essential hypertension, tobacco abuse, chronic venous insufficiency, and right forearm myalgia. Please see the A&P for the status of the pt's chronic medical problems.  Review of Systems  Constitutional: Positive for appetite change. Negative for activity change and unexpected weight change.       Appetite remains poor  Cardiovascular: Positive for leg swelling.  Endocrine: Negative for cold intolerance.  Musculoskeletal: Positive for myalgias. Negative for arthralgias, back pain, gait problem and joint swelling.  Skin: Positive for color change. Negative for rash and wound.       Darkness in left ankle when swelling occurs.  Psychiatric/Behavioral: Positive for decreased concentration and dysphoric mood.      Objective:   Physical Exam  Constitutional: She is oriented to person, place, and time. She appears well-developed and well-nourished. No distress.  HENT:  Head: Normocephalic and atraumatic.  Eyes: Conjunctivae are normal. Right eye exhibits no discharge. Left eye exhibits no discharge. No scleral icterus.  Musculoskeletal: Normal range of motion. She exhibits edema. She exhibits no tenderness or deformity.  Neurological: She is alert and oriented to person, place, and time. She exhibits normal muscle tone.  Skin: Skin is warm and dry. No rash noted. She is not diaphoretic. No erythema.  Psychiatric: She has a normal mood and affect. Her behavior is normal. Judgment and thought content normal.  Nursing note and vitals reviewed.     Assessment & Plan:   Please see problem oriented charting.

## 2017-06-23 NOTE — Assessment & Plan Note (Signed)
Assessment  Her blood pressure remains elevated today. She states she recently ran out of her lisinopril and therefore has not been taking it. Prior to running out she tolerated it well.  Plan  We will restart the lisinopril and increased to 40 mg by mouth daily. We will reassess the blood pressure control at the follow-up visit as well as a repeat BMP to assure no electrolyte or renal abnormalities on the higher dose.

## 2017-06-23 NOTE — Assessment & Plan Note (Signed)
Assessment  She notes right forearm myalgias which are partially relieved with Aleve.  She is interested in a longer acting anti-inflammatory.  Plan  We will discontinue the Aleve and start meloxicam 15 mg by mouth daily for her myalgias. We will reassess the efficacy of this therapy at the follow-up visit.

## 2017-06-23 NOTE — Patient Instructions (Addendum)
It was nice to see you again.  1) I increased the lisinopril to 40 mg daily for your blood pressure.  2) I increased the mirtazapine to 30 mg daily for your depression.  3) I started meloxicam 15 mg daily for your arm pain.  4) Buy over the counter compression stockings or hose.  Get the knee high size for the swelling and wear it on your work days.  Take them off when you get home from work.  I will see you back in three months to check up on your depression and blood pressure.

## 2017-06-23 NOTE — Assessment & Plan Note (Signed)
Assessment  She notes that throughout the day her legs will swell, particularly her left lower extremity at the ankle. She admits that the swelling goes down overnight. 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 This is likely leading to the chronic venous insufficiency with swelling.  Plan  As she is not in a position where she can avoid standing for long periods of time I recommended she purchase over-the-counter compression stockings that are knee-high. These are to be worn while she is at work. She can remove them when she returns home from work or when she is not at work. We will reassess the efficacy of this intervention at the follow-up visit.

## 2017-08-23 ENCOUNTER — Telehealth: Payer: Self-pay | Admitting: *Deleted

## 2017-08-23 NOTE — Telephone Encounter (Signed)
Courtesy call made to patient-did she get her mammogram? Mammogram order still on pt's record, but she was to f/u with gyn for mammo.  Pt will also need routine appt in the next 1-2 mths with her pcp.  No answer, message left on recorder.Leah Spittle Cassady5/22/201911:45 AM

## 2018-01-05 DIAGNOSIS — Z3042 Encounter for surveillance of injectable contraceptive: Secondary | ICD-10-CM | POA: Diagnosis not present

## 2018-04-05 DIAGNOSIS — Z6820 Body mass index (BMI) 20.0-20.9, adult: Secondary | ICD-10-CM | POA: Diagnosis not present

## 2018-04-05 DIAGNOSIS — Z01419 Encounter for gynecological examination (general) (routine) without abnormal findings: Secondary | ICD-10-CM | POA: Diagnosis not present

## 2018-04-16 DIAGNOSIS — Z13228 Encounter for screening for other metabolic disorders: Secondary | ICD-10-CM | POA: Diagnosis not present

## 2018-04-16 DIAGNOSIS — Z1231 Encounter for screening mammogram for malignant neoplasm of breast: Secondary | ICD-10-CM | POA: Diagnosis not present

## 2018-04-16 DIAGNOSIS — Z13220 Encounter for screening for lipoid disorders: Secondary | ICD-10-CM | POA: Diagnosis not present

## 2018-06-22 DIAGNOSIS — Z3042 Encounter for surveillance of injectable contraceptive: Secondary | ICD-10-CM | POA: Diagnosis not present

## 2018-07-02 ENCOUNTER — Encounter: Payer: Self-pay | Admitting: *Deleted

## 2018-08-15 ENCOUNTER — Ambulatory Visit: Payer: 59

## 2018-09-13 ENCOUNTER — Other Ambulatory Visit: Payer: Self-pay | Admitting: *Deleted

## 2018-09-13 DIAGNOSIS — I10 Essential (primary) hypertension: Secondary | ICD-10-CM

## 2018-09-13 MED ORDER — LISINOPRIL 40 MG PO TABS: 40.0000 mg | ORAL_TABLET | Freq: Every day | ORAL | 0 refills | Status: DC

## 2018-10-16 ENCOUNTER — Ambulatory Visit: Payer: 59 | Admitting: Internal Medicine

## 2018-10-17 ENCOUNTER — Encounter: Payer: Self-pay | Admitting: Internal Medicine

## 2019-02-05 ENCOUNTER — Other Ambulatory Visit: Payer: Self-pay | Admitting: Internal Medicine

## 2019-02-05 DIAGNOSIS — I10 Essential (primary) hypertension: Secondary | ICD-10-CM

## 2019-02-05 MED ORDER — LISINOPRIL 40 MG PO TABS: 40.0000 mg | ORAL_TABLET | Freq: Every day | ORAL | 0 refills | Status: DC

## 2019-02-05 NOTE — Telephone Encounter (Signed)
Refill Request   lisinopril (ZESTRIL) 40 MG tablet  [facility] [place], [place] - [place]

## 2019-02-05 NOTE — Telephone Encounter (Signed)
done

## 2019-02-19 ENCOUNTER — Ambulatory Visit: Payer: 59 | Admitting: Internal Medicine

## 2019-02-19 ENCOUNTER — Other Ambulatory Visit: Payer: Self-pay

## 2019-02-19 VITALS — BP 150/98 | HR 92 | Temp 98.7°F | Wt 103.7 lb

## 2019-02-19 DIAGNOSIS — Z23 Encounter for immunization: Secondary | ICD-10-CM | POA: Diagnosis not present

## 2019-02-19 DIAGNOSIS — Z720 Tobacco use: Secondary | ICD-10-CM

## 2019-02-19 DIAGNOSIS — F17210 Nicotine dependence, cigarettes, uncomplicated: Secondary | ICD-10-CM

## 2019-02-19 DIAGNOSIS — F331 Major depressive disorder, recurrent, moderate: Secondary | ICD-10-CM | POA: Diagnosis not present

## 2019-02-19 DIAGNOSIS — I872 Venous insufficiency (chronic) (peripheral): Secondary | ICD-10-CM

## 2019-02-19 DIAGNOSIS — Z79899 Other long term (current) drug therapy: Secondary | ICD-10-CM

## 2019-02-19 DIAGNOSIS — I10 Essential (primary) hypertension: Secondary | ICD-10-CM | POA: Diagnosis not present

## 2019-02-19 DIAGNOSIS — Z0000 Encounter for general adult medical examination without abnormal findings: Secondary | ICD-10-CM

## 2019-02-19 DIAGNOSIS — Z8739 Personal history of other diseases of the musculoskeletal system and connective tissue: Secondary | ICD-10-CM

## 2019-02-19 DIAGNOSIS — M791 Myalgia, unspecified site: Secondary | ICD-10-CM

## 2019-02-19 MED ORDER — SERTRALINE HCL 50 MG PO TABS: 50.0000 mg | ORAL_TABLET | Freq: Every day | ORAL | 2 refills | Status: DC

## 2019-02-19 NOTE — Patient Instructions (Signed)
-  It was a pleasure seeing you today -Please follow-up with me in 3 months -I started you on Zoloft for depression and referred you to Ms. Miquel Dunn our behavioral health clinician.   -Please follow-up for your compression stockings as an outpatient.  Ms. Gwinda Maine will give you the information for follow-up for this -Please continue to try to cut back on her tobacco use -We will give you a flu shot today -We will check some blood work on you today -Please call me if you have any questions or concerns

## 2019-02-19 NOTE — Assessment & Plan Note (Signed)
BP Readings from Last 3 Encounters:  02/19/19 (!) 150/98  06/23/17 (!) 173/97  09/16/16 (!) 138/92    Lab Results  Component Value Date   NA 138 08/10/2016   K 3.4 (L) 08/10/2016   CREATININE 0.62 08/10/2016    Assessment: Blood pressure control:  Fair Progress toward BP goal:   Improved Comments: Patient states that she started retaking her lisinopril 40 mg in the last week but had not been compliant with this medication for a while before that.  Plan: Medications:  continue current medications Educational resources provided:   Self management tools provided:   Other plans: We will check BMP today

## 2019-02-19 NOTE — Progress Notes (Signed)
   Subjective:    Patient ID: Leah Rose, female    DOB: November 07, 1970, 48 y.o.   MRN: [id number]  HPI  I have seen and examined this patient.  Patient is here for routine follow-up of her hypertension and depression.  Patient states that she stopped taking her Remeron as it did not make her feel good and that her depression is not well controlled.  Patient states that she stopped taking her blood pressure medications for a couple of months but recently restarted this last week.  She also states that she has not been eating well and that the depression is the worst that it has been.   Review of Systems  Constitutional: Positive for appetite change. Negative for unexpected weight change.  HENT: Negative.   Respiratory: Negative.   Cardiovascular: Positive for leg swelling.       Complains of mild lower extremity swelling at the end of the workday after standing on her feet all day  Gastrointestinal: Negative.   Musculoskeletal: Negative.   Neurological: Negative.   Psychiatric/Behavioral:       Appears depressed and was crying at different points during the interview       Objective:   Physical Exam Constitutional:      Appearance: Normal appearance.  HENT:     Head: Normocephalic and atraumatic.  Neck:     Musculoskeletal: Neck supple.  Cardiovascular:     Rate and Rhythm: Normal rate and regular rhythm.     Heart sounds: Normal heart sounds.  Pulmonary:     Breath sounds: Normal breath sounds. No wheezing or rales.  Abdominal:     General: Bowel sounds are normal. There is no distension.     Palpations: Abdomen is soft.     Tenderness: There is no abdominal tenderness.  Musculoskeletal:        General: No tenderness.     Comments: Trace bilateral lower extremity pitting edema noted  Lymphadenopathy:     Cervical: No cervical adenopathy.  Neurological:     General: No focal deficit present.     Mental Status: She is alert and oriented to person, place, and time.   Psychiatric:     Comments: Depressed mood with intermittent episodes of crying           Assessment & Plan:  Please see problem based charting for assessment and plan:

## 2019-02-19 NOTE — Assessment & Plan Note (Signed)
-  Flu shot given today -I offered to have the patient get her HIV screen today.  Patient refused.  States that she will do this at her follow-up visit

## 2019-02-19 NOTE — Assessment & Plan Note (Signed)
-  Patient states that her depression is the worst that it has been and has a depressed mood and decreased appetite.  Patient had intermittent episodes of crying during the interview. -She states that she has not been taking her Remeron as she did not like the way that it made her feel -We will start the patient on Zoloft 50 mg daily today and refer her to our behavioral health clinician Dessie Coma) for an urgent referral -Patient denies any suicidal or homicidal ideation currently -Patient's PHQ 9 score is elevated to 13 today -If she is not receiving any improvement with the Zoloft and 2 to 3 weeks we can increase this to 100 mg and refer her to psychiatry

## 2019-02-19 NOTE — Assessment & Plan Note (Signed)
-  Patient states that she had quit smoking for couple of years but now is back to smoking 2 packs/day -She states that her depression is likely contributing to her need to smoke currently -We will aggressively treat her depression and reassess her readiness to quit at her follow-up visit

## 2019-02-19 NOTE — Assessment & Plan Note (Signed)
-  On her last visit here she stated that she has a right forearm myalgias -This is now resolved -No further work-up at this time

## 2019-02-19 NOTE — Assessment & Plan Note (Signed)
-  This problem is chronic and stable -Patient has a history of chronic venous insufficiency and notes swelling of both her lower extremities after standing at her job all day but the swelling is worse in the left lower extremity -Patient was given information on compression stockings and will follow up in the medical supply company to obtain these -No further work-up at this time

## 2019-02-20 ENCOUNTER — Encounter: Payer: Self-pay | Admitting: Internal Medicine

## 2019-02-20 LAB — BMP8+ANION GAP
Anion Gap: 16 mmol/L (ref 10.0–18.0)
BUN/Creatinine Ratio: 9 (ref 9–23)
BUN: 6 mg/dL (ref 6–24)
CO2: 21 mmol/L (ref 20–29)
Calcium: 9.5 mg/dL (ref 8.7–10.2)
Chloride: 104 mmol/L (ref 96–106)
Creatinine, Ser: 0.66 mg/dL (ref 0.57–1.00)
GFR calc Af Amer: 120 mL/min/{1.73_m2} (ref >59)
GFR calc non Af Amer: 104 mL/min/{1.73_m2} (ref >59)
Glucose: 89 mg/dL (ref 65–99)
Potassium: 3.9 mmol/L (ref 3.5–5.2)
Sodium: 141 mmol/L (ref 134–144)

## 2019-02-21 ENCOUNTER — Telehealth: Payer: Self-pay | Admitting: Licensed Clinical Social Worker

## 2019-02-21 ENCOUNTER — Encounter: Payer: Self-pay | Admitting: Licensed Clinical Social Worker

## 2019-02-21 NOTE — Telephone Encounter (Signed)
Patient was called to discuss the referral. Patient did not answer, and a vm was left for the patient.

## 2019-03-13 ENCOUNTER — Telehealth: Payer: Self-pay | Admitting: Licensed Clinical Social Worker

## 2019-03-13 NOTE — Telephone Encounter (Signed)
Patient was contacted (2nd attempt) to establish care. Patient did not answer, and a vm was left for the patient.  

## 2019-03-21 NOTE — Addendum Note (Signed)
Addended by: Hulan Fray on: 03/21/2019 01:57 PM   Modules accepted: Orders

## 2019-07-23 ENCOUNTER — Other Ambulatory Visit: Payer: Self-pay | Admitting: Internal Medicine

## 2019-07-23 DIAGNOSIS — I10 Essential (primary) hypertension: Secondary | ICD-10-CM

## 2019-10-08 ENCOUNTER — Other Ambulatory Visit: Payer: Self-pay | Admitting: Obstetrics and Gynecology

## 2019-10-08 DIAGNOSIS — R928 Other abnormal and inconclusive findings on diagnostic imaging of breast: Secondary | ICD-10-CM

## 2019-10-16 ENCOUNTER — Ambulatory Visit
Admission: RE | Admit: 2019-10-16 | Discharge: 2019-10-16 | Disposition: A | Discharge status: Home / Self Care | Payer: 59 | Source: Ambulatory Visit | Attending: Obstetrics and Gynecology | Admitting: Obstetrics and Gynecology

## 2019-10-16 ENCOUNTER — Other Ambulatory Visit: Payer: Self-pay | Admitting: Obstetrics and Gynecology

## 2019-10-16 ENCOUNTER — Other Ambulatory Visit: Payer: Self-pay

## 2019-10-16 DIAGNOSIS — R928 Other abnormal and inconclusive findings on diagnostic imaging of breast: Secondary | ICD-10-CM

## 2019-10-16 DIAGNOSIS — R599 Enlarged lymph nodes, unspecified: Secondary | ICD-10-CM

## 2019-10-16 IMAGING — US US BREAST*L* LIMITED INC AXILLA
1 series · 12 of 12 positions shown · non-contrast
Comparison: Screening exam dated [DATE] and prior studies.

CLINICAL DATA: Screening recall for a left breast mass and
bilateral prominent axillary lymph nodes.

EXAM:
DIGITAL DIAGNOSTIC LEFT MAMMOGRAM WITH CAD AND TOMO
ULTRASOUND BILATERAL BREAST/BILATERAL AXILLA

[Series 1: us breast*left* limited inc axilla · 0.06mm/px · 12 of 12 slices shown]
[im 1/12]
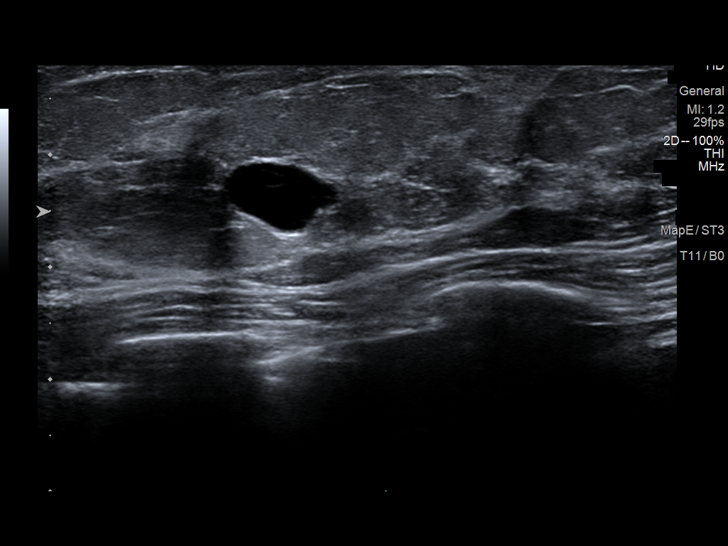
[im 2/12]
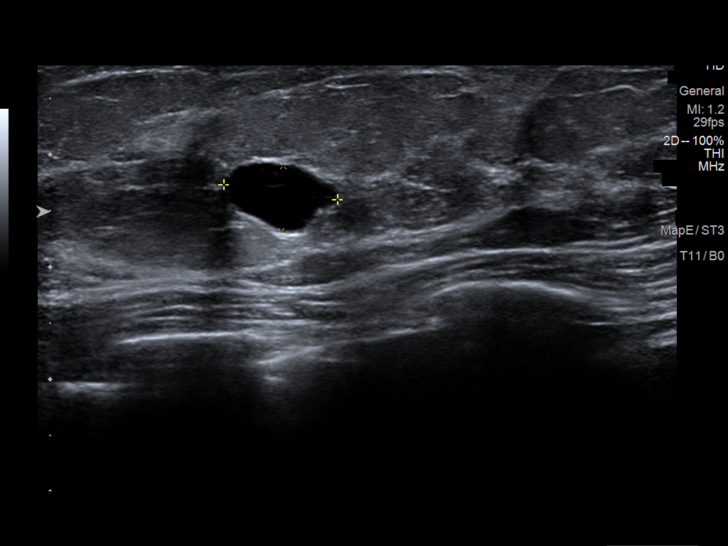
[im 3/12]
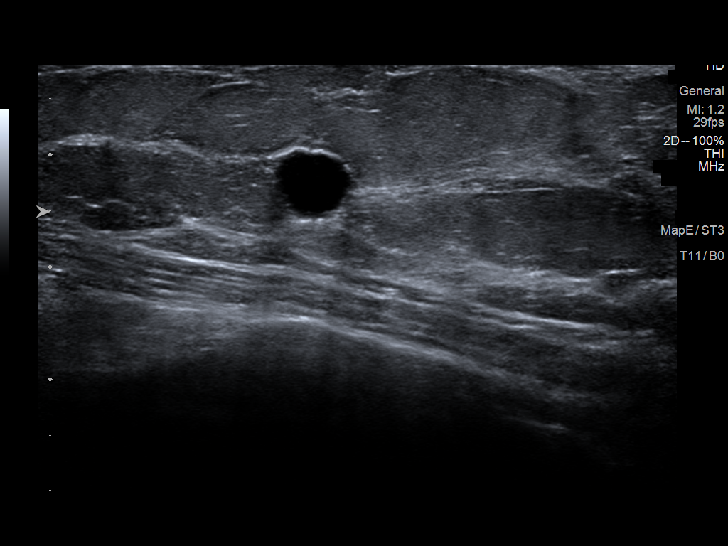
[im 4/12]
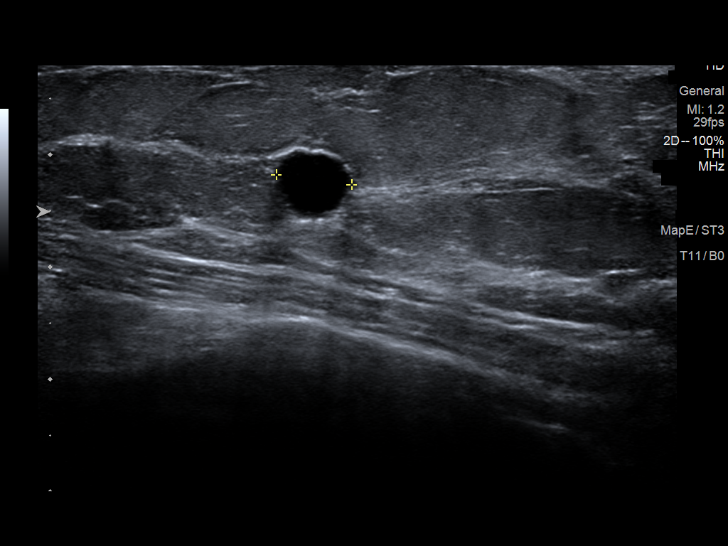
[im 5/12]
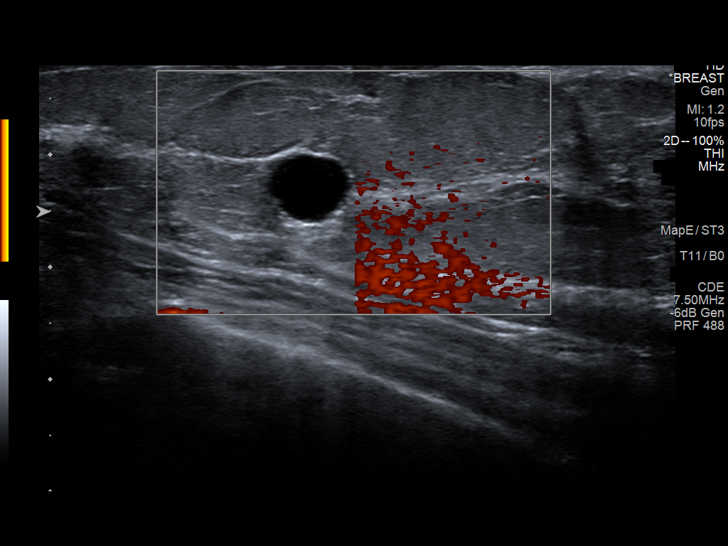
[im 6/12]
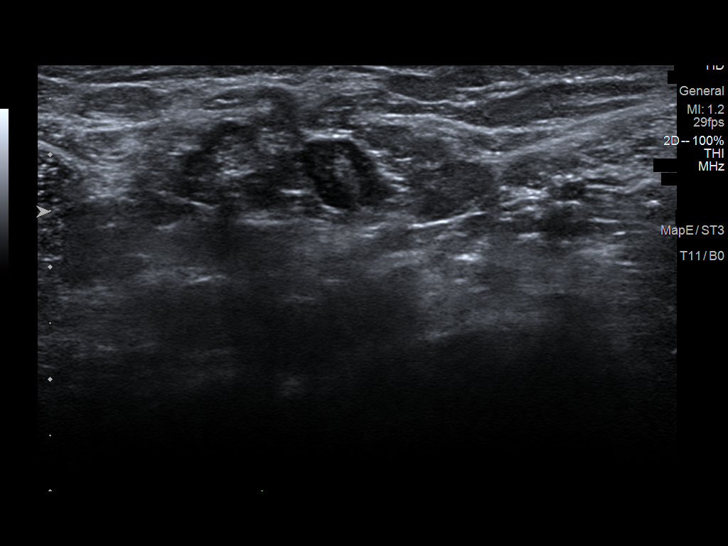
[im 7/12]
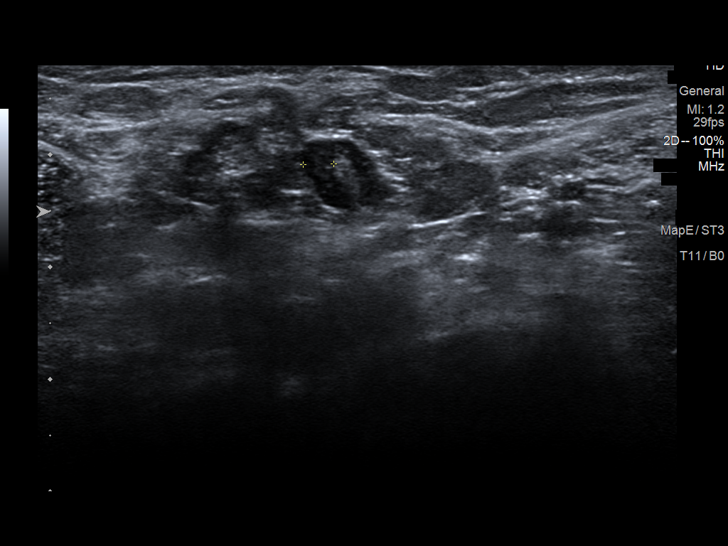
[im 8/12]
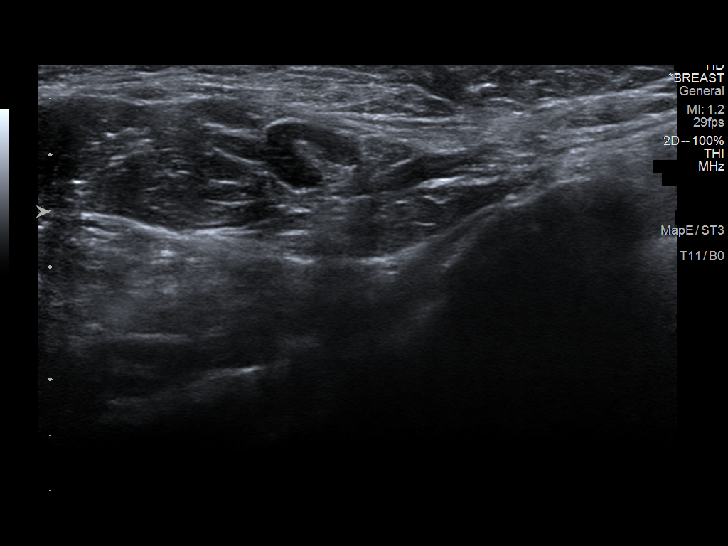
[im 9/12]
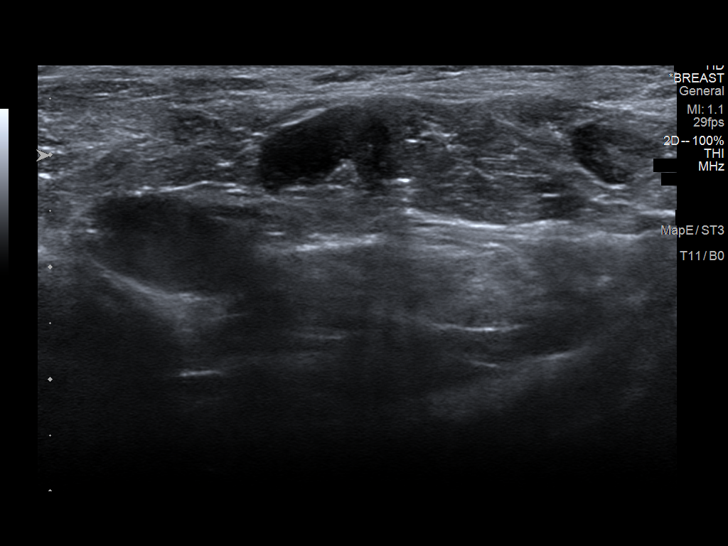
[im 10/12]
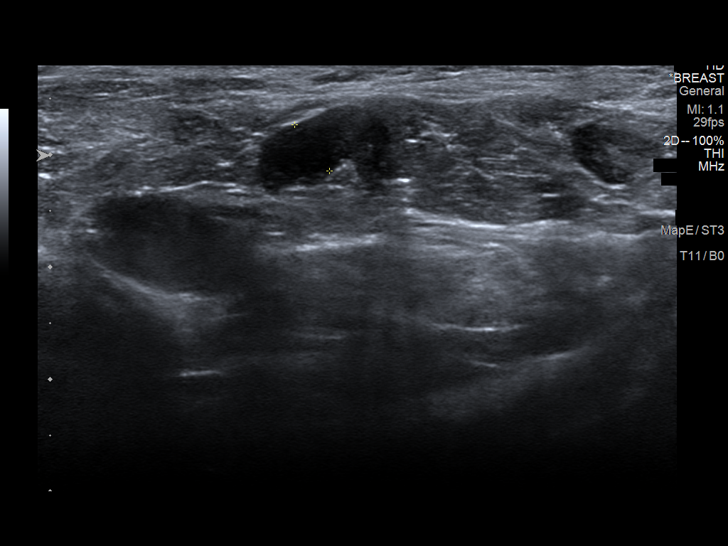
[im 11/12]
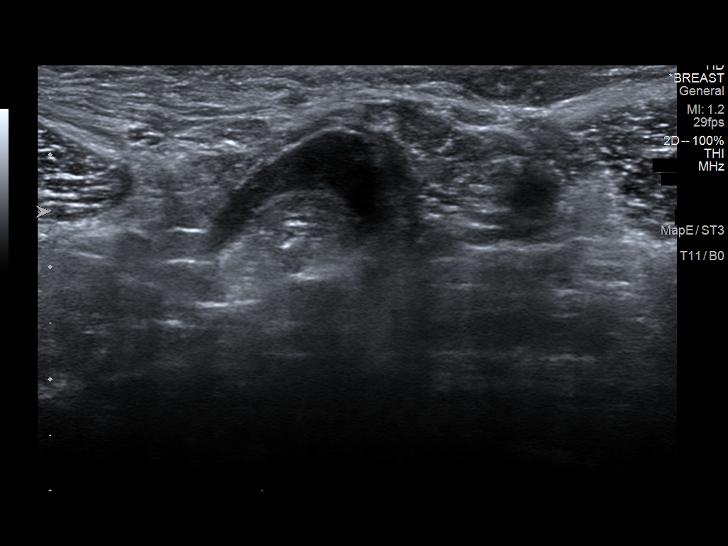
[im 12/12]
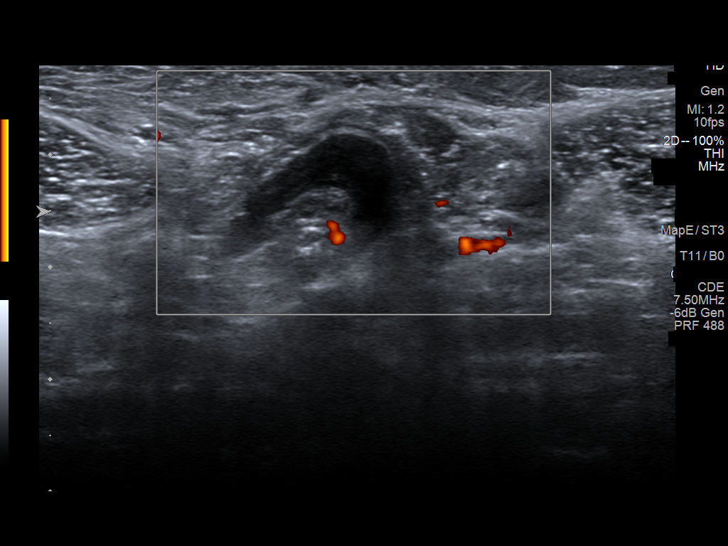

[12 of 12 positions shown; findings below may reference images not displayed]

ACR Breast Density Category c: The breast tissue is heterogeneously
dense, which may obscure small masses.
FINDINGS: The mass noted in the inferomedial left breast persists on
diagnostic imaging as an oval, circumscribed mass that projects in
the lower inner quadrant. There are no other breast masses, no areas
of architectural distortion and no suspicious calcifications.

Mammographic images were processed with CAD.

Targeted left breast ultrasound is performed, showing a simple cyst
in the left breast at 8 o'clock, 2 cm the nipple, measuring 10 x 6 x
7 mm, consistent in size, shape and location to the mammographic
mass. There are no solid masses or suspicious lesions.

Targeted bilateral axilla ultrasound is performed, showing prominent
bilateral axillary lymph nodes with thickened cortices, but normal
overall sizes and morphology. In both axilla, there are lymph nodes
with cortices thickened to 5 mm. On the right, there are small areas
of cystic change within the thickened cortex of a single axillary
node.
IMPRESSION: 1. Bilateral axillary lymph nodes with thickened cortices. This is
consistent with a systemic process, and there are nodes are most
likely benign/reactive. However, a lymphoproliferative disorder
including lymphoma is not excluded. Recommend tissue sampling the
right axillary lymph node that has cystic change with its thickened
cortex.
2. Benign left breast cyst.

RECOMMENDATION:
1. Ultrasound-guided core needle biopsy the right axillary lymph
node with the cortex thickened to 5 mm, which has associated small
cystic change within the cortex. This procedure will be scheduled
prior to the patient leaving the [REDACTED].

I have discussed the findings and recommendations with the patient.
If applicable, a reminder letter will be sent to the patient
regarding the next appointment.

BI-RADS CATEGORY  4: Suspicious.

## 2019-10-16 IMAGING — US US AXILLARY RIGHT
1 series · 11 of 11 positions shown · non-contrast
Comparison: Screening exam dated [DATE] and prior studies.

CLINICAL DATA: Screening recall for a left breast mass and
bilateral prominent axillary lymph nodes.

EXAM:
DIGITAL DIAGNOSTIC LEFT MAMMOGRAM WITH CAD AND TOMO
ULTRASOUND BILATERAL BREAST/BILATERAL AXILLA

[Series 1: us axillary right · 0.07mm/px · 11 of 11 slices shown]
[im 1/11]
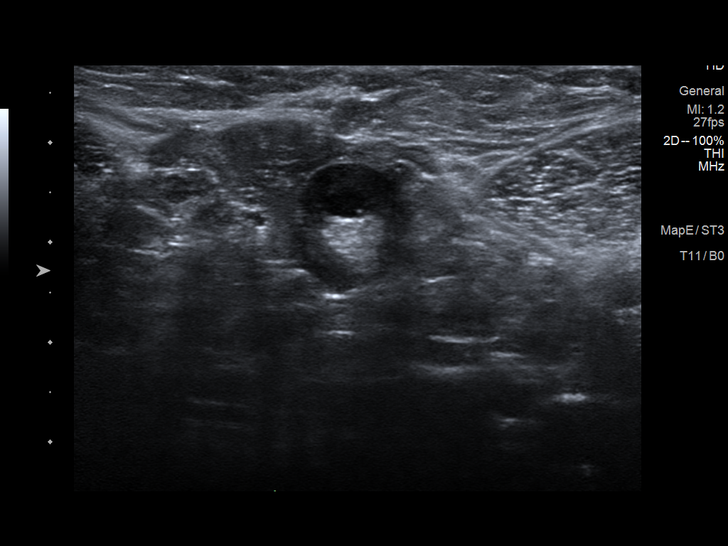
[im 2/11]
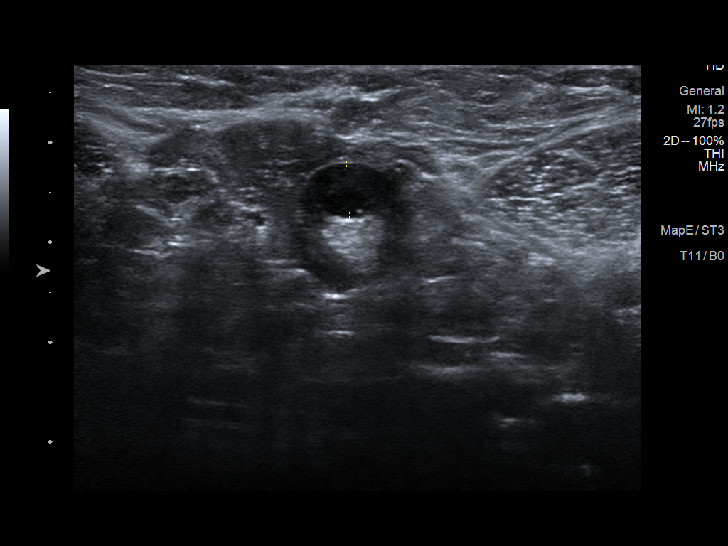
[im 3/11]
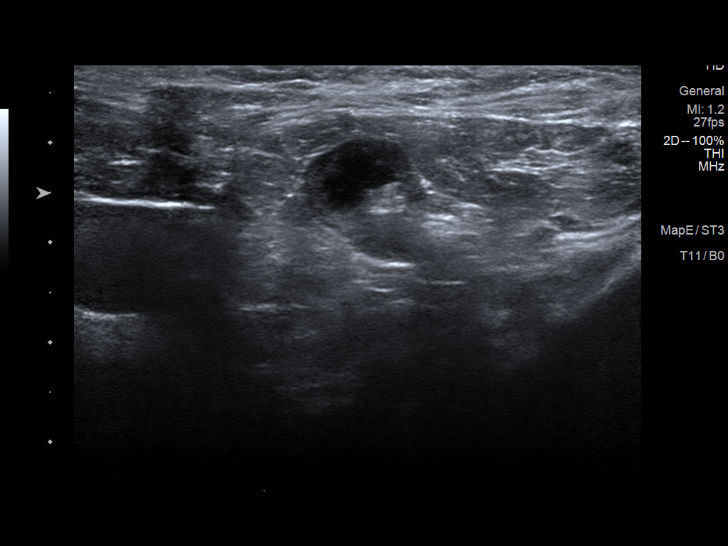
[im 4/11]
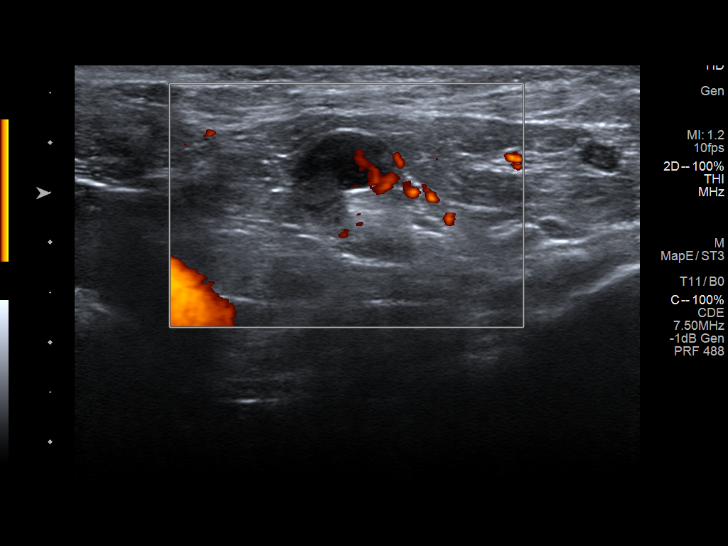
[im 5/11]
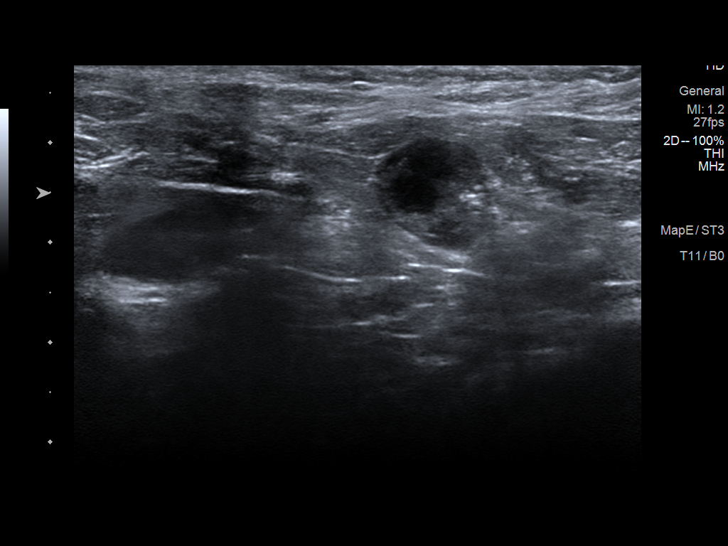
[im 6/11]
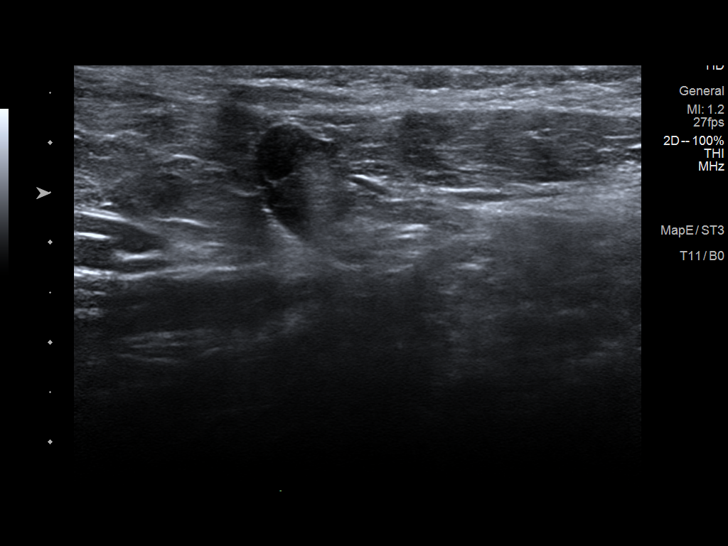
[im 7/11]
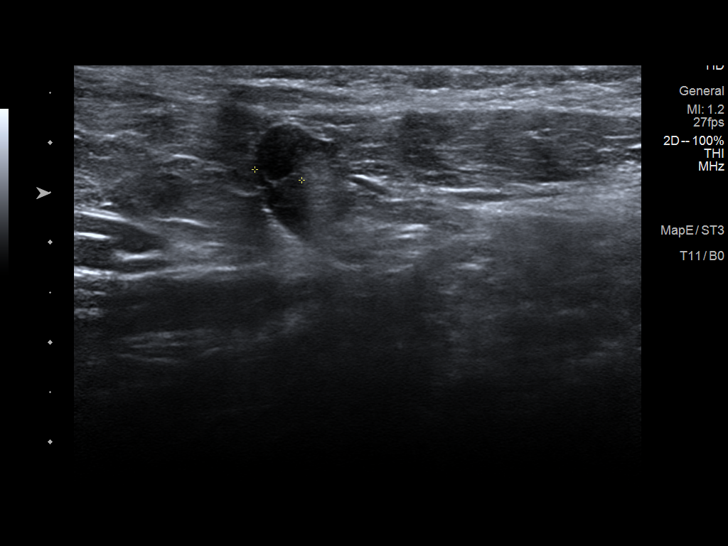
[im 8/11]
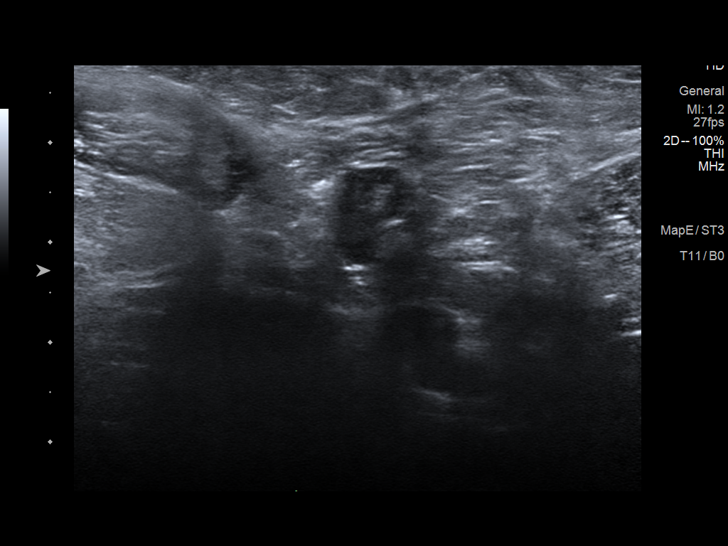
[im 9/11]
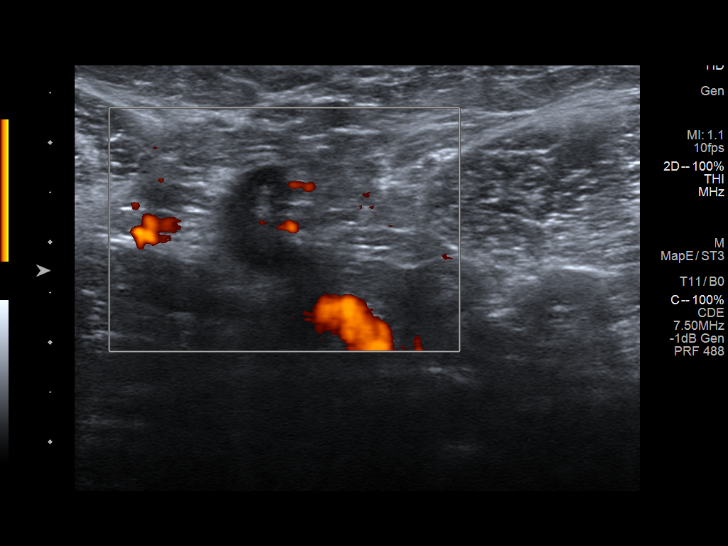
[im 10/11]
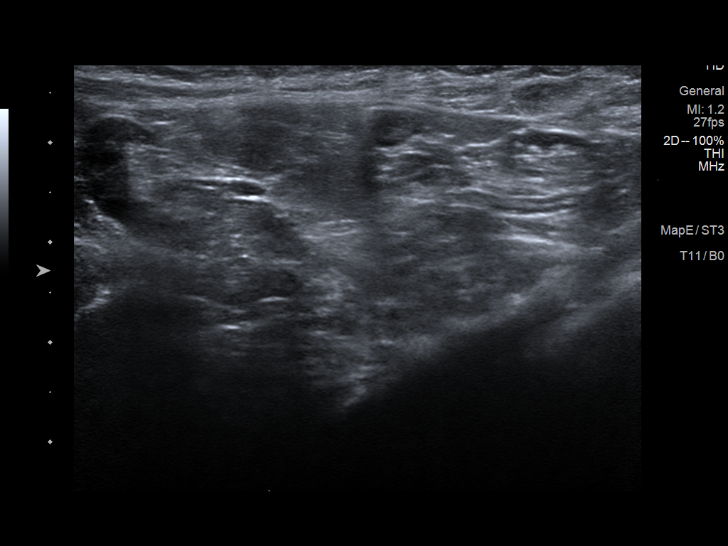
[im 11/11]
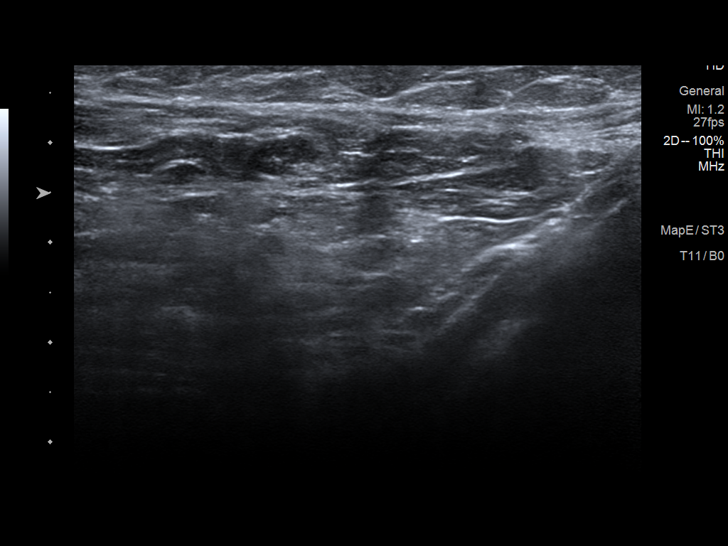

[11 of 11 positions shown; findings below may reference images not displayed]

ACR Breast Density Category c: The breast tissue is heterogeneously
dense, which may obscure small masses.
FINDINGS: The mass noted in the inferomedial left breast persists on
diagnostic imaging as an oval, circumscribed mass that projects in
the lower inner quadrant. There are no other breast masses, no areas
of architectural distortion and no suspicious calcifications.

Mammographic images were processed with CAD.

Targeted left breast ultrasound is performed, showing a simple cyst
in the left breast at 8 o'clock, 2 cm the nipple, measuring 10 x 6 x
7 mm, consistent in size, shape and location to the mammographic
mass. There are no solid masses or suspicious lesions.

Targeted bilateral axilla ultrasound is performed, showing prominent
bilateral axillary lymph nodes with thickened cortices, but normal
overall sizes and morphology. In both axilla, there are lymph nodes
with cortices thickened to 5 mm. On the right, there are small areas
of cystic change within the thickened cortex of a single axillary
node.
IMPRESSION: 1. Bilateral axillary lymph nodes with thickened cortices. This is
consistent with a systemic process, and there are nodes are most
likely benign/reactive. However, a lymphoproliferative disorder
including lymphoma is not excluded. Recommend tissue sampling the
right axillary lymph node that has cystic change with its thickened
cortex.
2. Benign left breast cyst.

RECOMMENDATION:
1. Ultrasound-guided core needle biopsy the right axillary lymph
node with the cortex thickened to 5 mm, which has associated small
cystic change within the cortex. This procedure will be scheduled
prior to the patient leaving the [REDACTED].

I have discussed the findings and recommendations with the patient.
If applicable, a reminder letter will be sent to the patient
regarding the next appointment.

BI-RADS CATEGORY  4: Suspicious.

## 2019-10-16 IMAGING — MG MM DIGITAL DIAGNOSTIC UNILAT*L* W/ TOMO W/ CAD
6 series · 6 of 18 positions shown · non-contrast
Comparison: Screening exam dated [DATE] and prior studies.

CLINICAL DATA: Screening recall for a left breast mass and
bilateral prominent axillary lymph nodes.

EXAM:
DIGITAL DIAGNOSTIC LEFT MAMMOGRAM WITH CAD AND TOMO
ULTRASOUND BILATERAL BREAST/BILATERAL AXILLA

[L MLO synth-2D (1 of 2)]
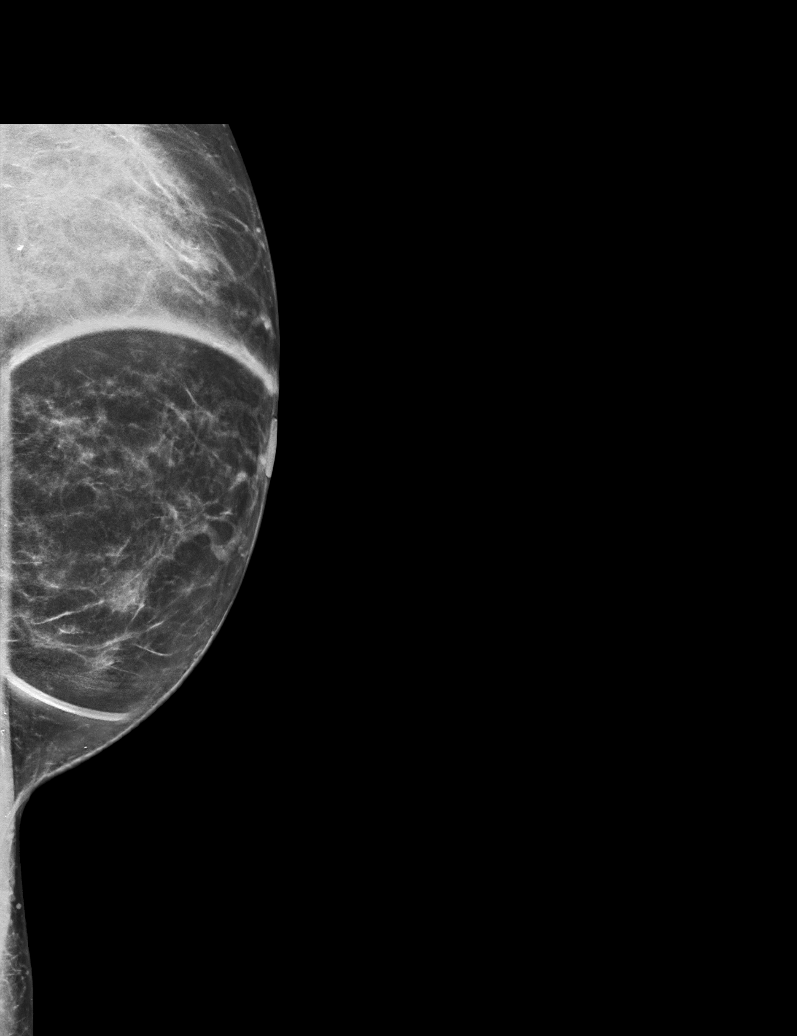

[L MLO synth-2D (2 of 2)]
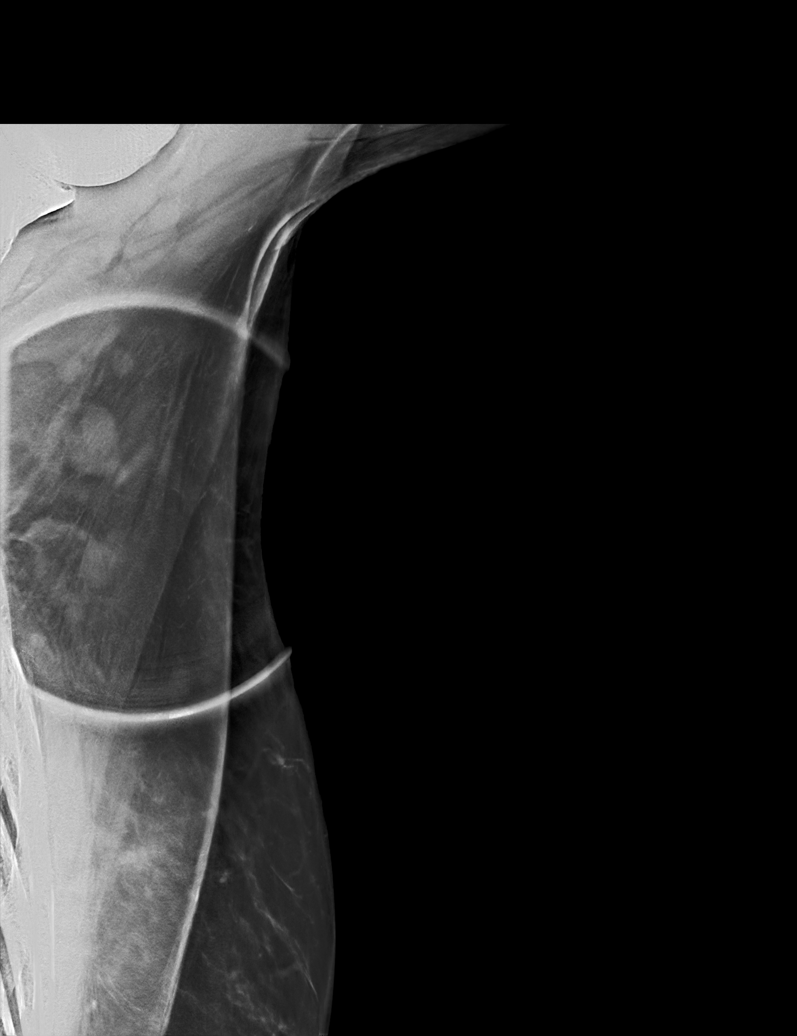

[L CC synth-2D]
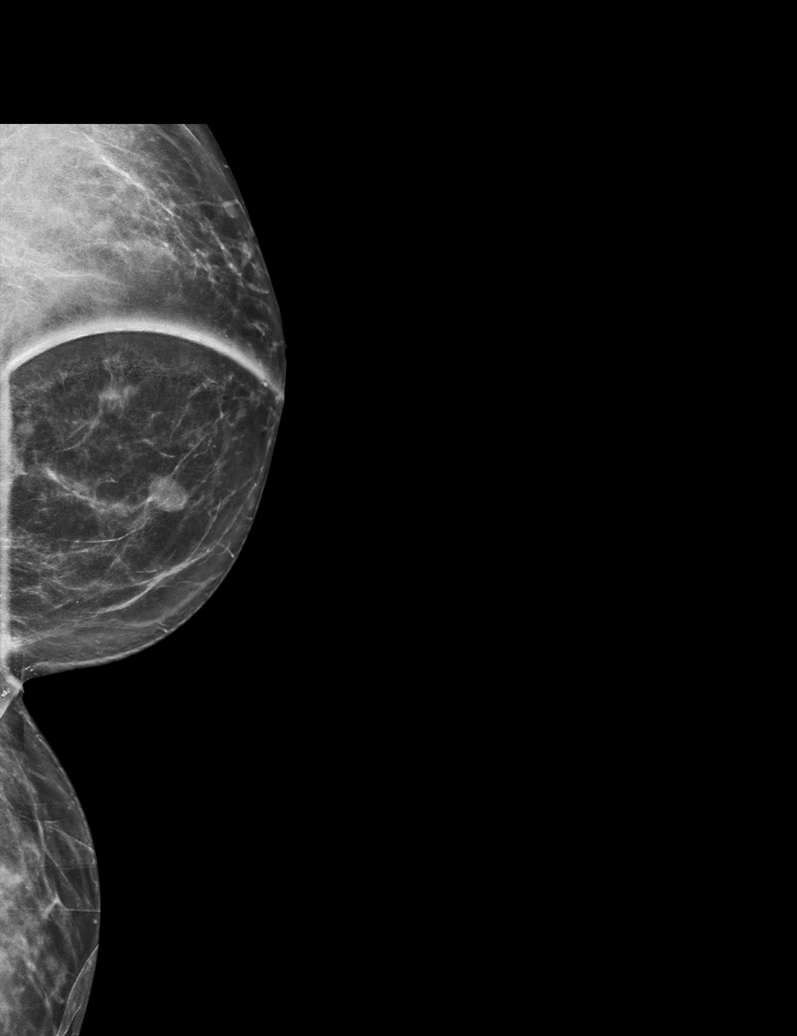

[L CC tomo · tomo slice 31/62.0]
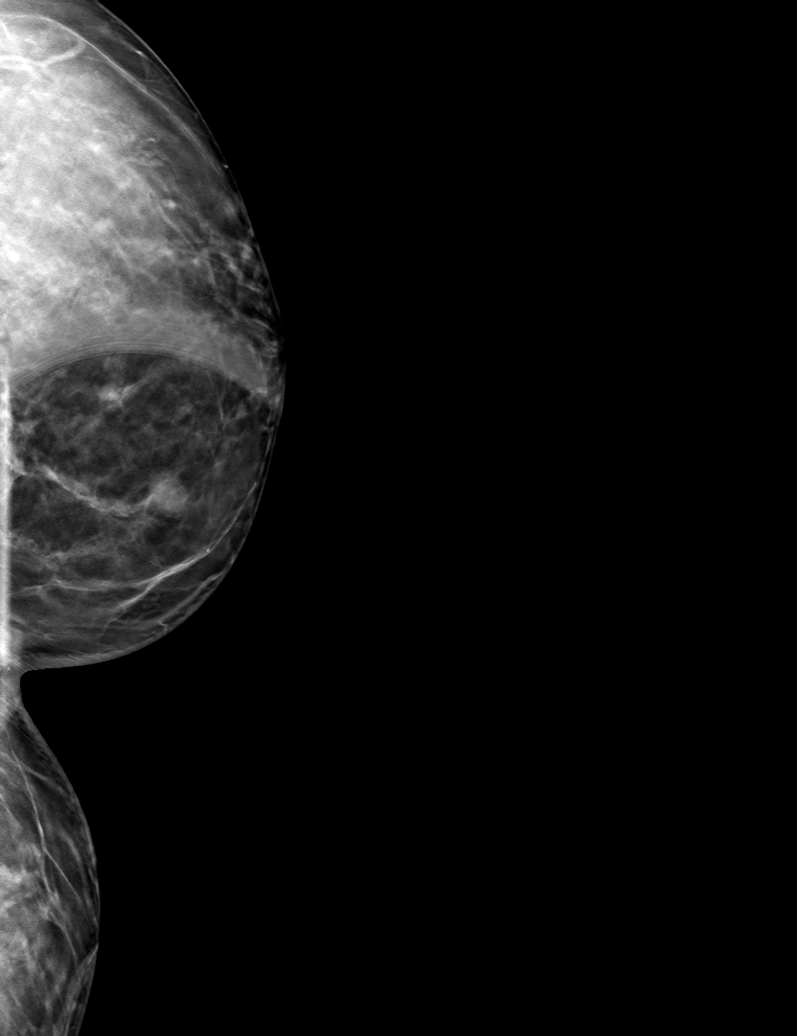

[L MLO tomo (1 of 2) · tomo slice 32/63.0]
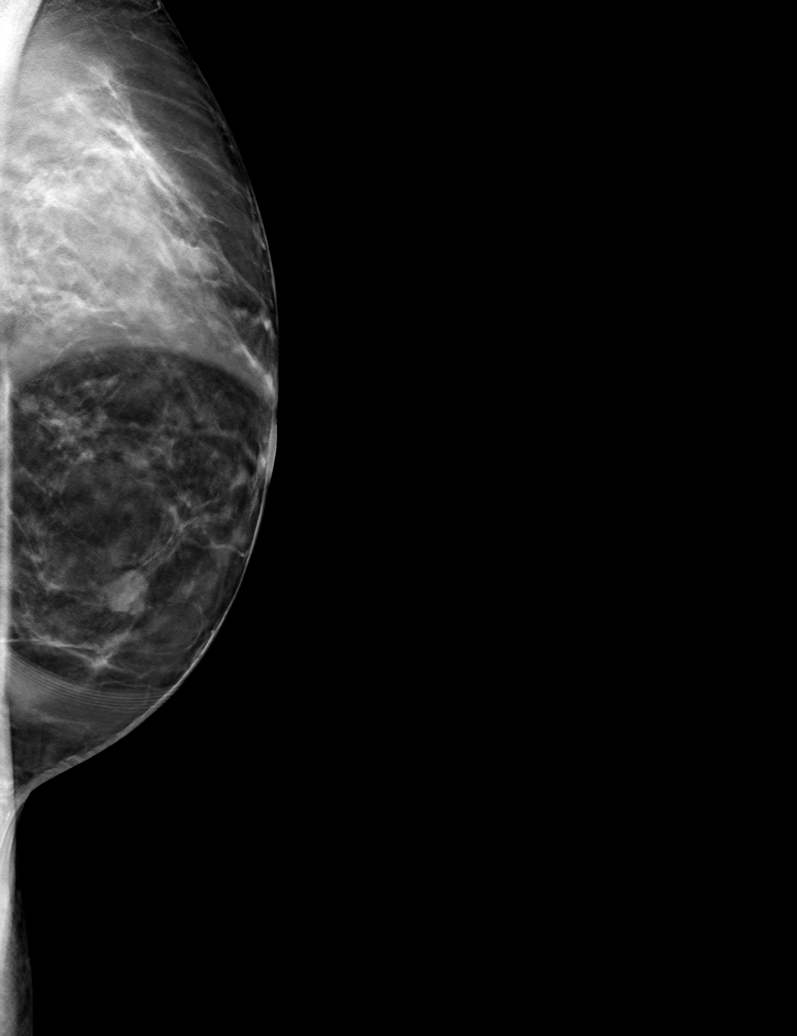

[L MLO tomo (2 of 2) · tomo slice 49/98.0]
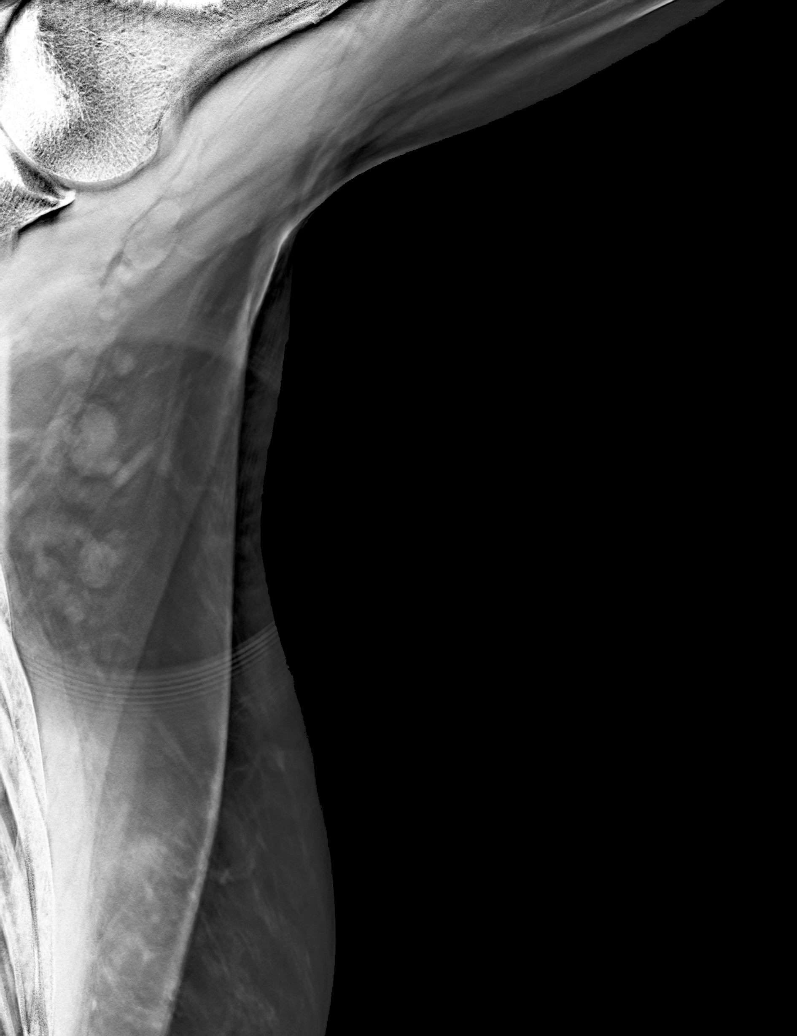

[6 of 18 positions shown; findings below may reference images not displayed]

ACR Breast Density Category c: The breast tissue is heterogeneously
dense, which may obscure small masses.
FINDINGS: The mass noted in the inferomedial left breast persists on
diagnostic imaging as an oval, circumscribed mass that projects in
the lower inner quadrant. There are no other breast masses, no areas
of architectural distortion and no suspicious calcifications.

Mammographic images were processed with CAD.

Targeted left breast ultrasound is performed, showing a simple cyst
in the left breast at 8 o'clock, 2 cm the nipple, measuring 10 x 6 x
7 mm, consistent in size, shape and location to the mammographic
mass. There are no solid masses or suspicious lesions.

Targeted bilateral axilla ultrasound is performed, showing prominent
bilateral axillary lymph nodes with thickened cortices, but normal
overall sizes and morphology. In both axilla, there are lymph nodes
with cortices thickened to 5 mm. On the right, there are small areas
of cystic change within the thickened cortex of a single axillary
node.
IMPRESSION: 1. Bilateral axillary lymph nodes with thickened cortices. This is
consistent with a systemic process, and there are nodes are most
likely benign/reactive. However, a lymphoproliferative disorder
including lymphoma is not excluded. Recommend tissue sampling the
right axillary lymph node that has cystic change with its thickened
cortex.
2. Benign left breast cyst.

RECOMMENDATION:
1. Ultrasound-guided core needle biopsy the right axillary lymph
node with the cortex thickened to 5 mm, which has associated small
cystic change within the cortex. This procedure will be scheduled
prior to the patient leaving the [REDACTED].

I have discussed the findings and recommendations with the patient.
If applicable, a reminder letter will be sent to the patient
regarding the next appointment.

BI-RADS CATEGORY  4: Suspicious.

## 2019-10-18 ENCOUNTER — Other Ambulatory Visit: Payer: Self-pay

## 2019-10-18 ENCOUNTER — Other Ambulatory Visit: Payer: Self-pay | Admitting: Obstetrics and Gynecology

## 2019-10-18 ENCOUNTER — Ambulatory Visit
Admission: RE | Admit: 2019-10-18 | Discharge: 2019-10-18 | Disposition: A | Discharge status: Home / Self Care | Payer: 59 | Source: Ambulatory Visit | Attending: Obstetrics and Gynecology | Admitting: Obstetrics and Gynecology

## 2019-10-18 DIAGNOSIS — R599 Enlarged lymph nodes, unspecified: Secondary | ICD-10-CM

## 2019-10-18 IMAGING — US US AXILLARY NODE CORE BIOPSY LEFT
1 series · 9 of 9 positions shown · non-contrast
Comparison: Previous exam(s).
COMPARISON: Previous exam(s).

Addendum:
CLINICAL DATA: 49-year-old female with mild bilateral
lymphadenopathy. Prior to the biopsy evaluation of the right axilla
was performed. However, the lymph node identified on recent
diagnostic workup was coursing through multiple large, pulsating
vessels and could not be safely targeted. Evaluation of the left
axilla was performed and a similar, prominent was identified and
biopsied.

EXAM:
US AXILLARY NODE CORE BIOPSY LEFT

[Series 1: us axillary node core biopsy left · 0.07mm/px · 9 of 9 slices shown]
[im 1/9]
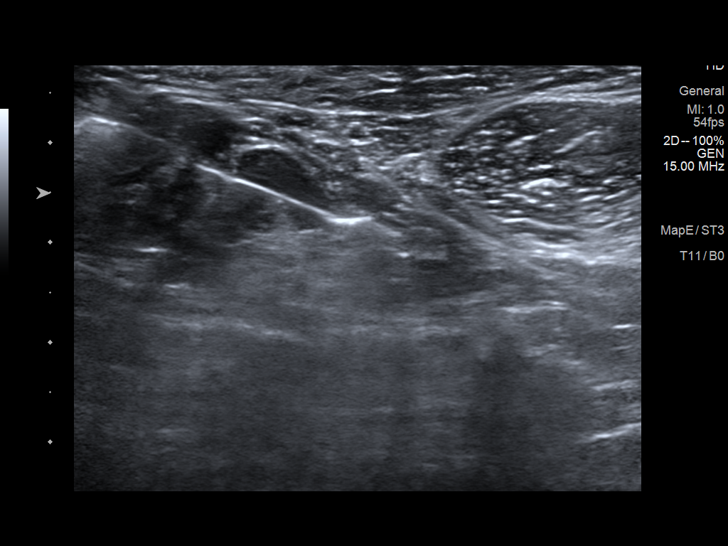
[im 2/9]
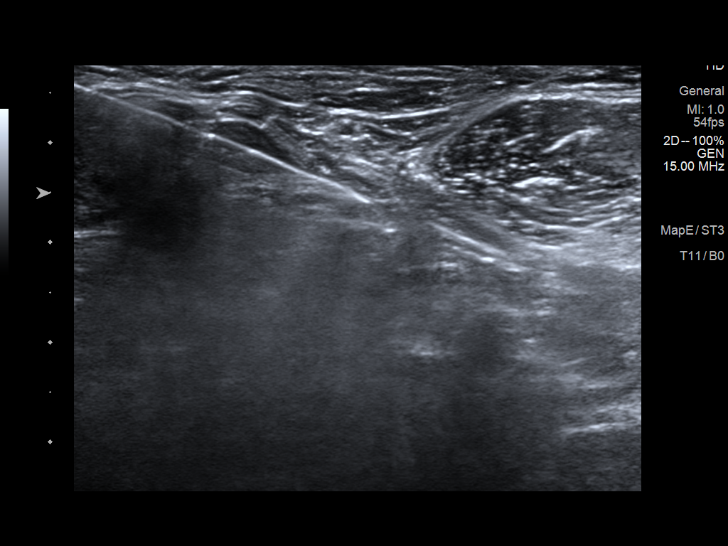
[im 3/9]
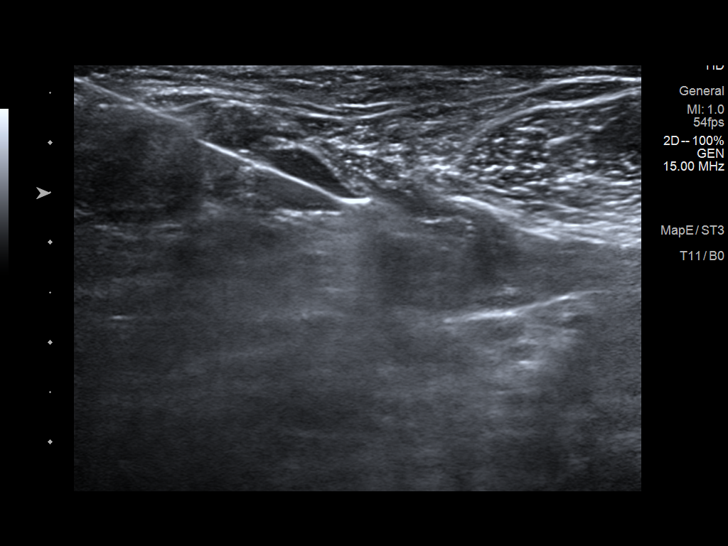
[im 4/9]
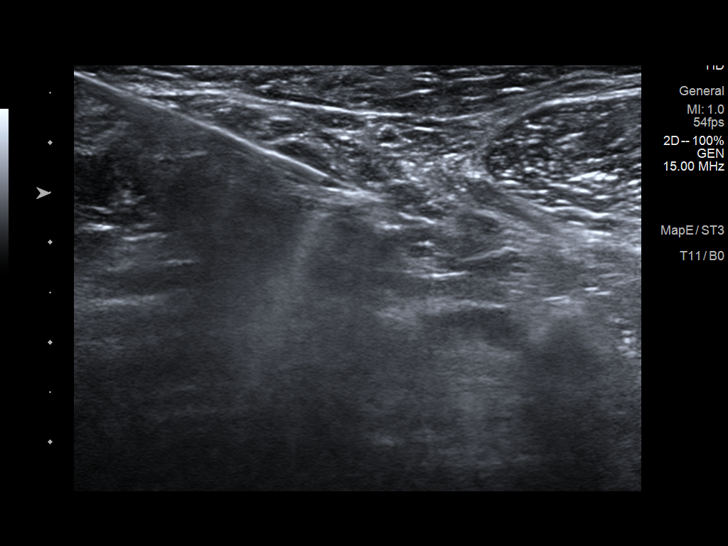
[im 5/9]
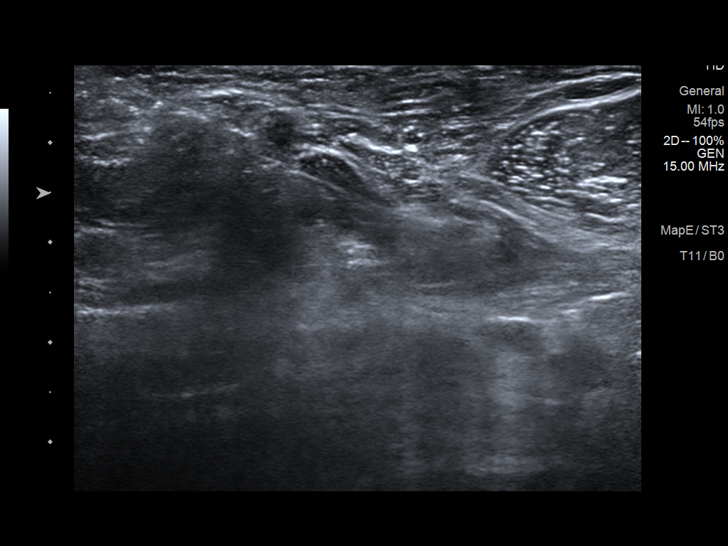
[im 6/9]
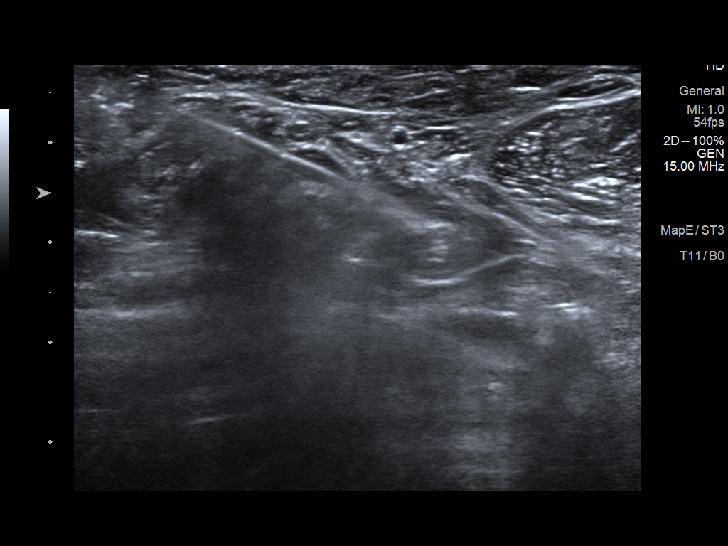
[im 7/9]
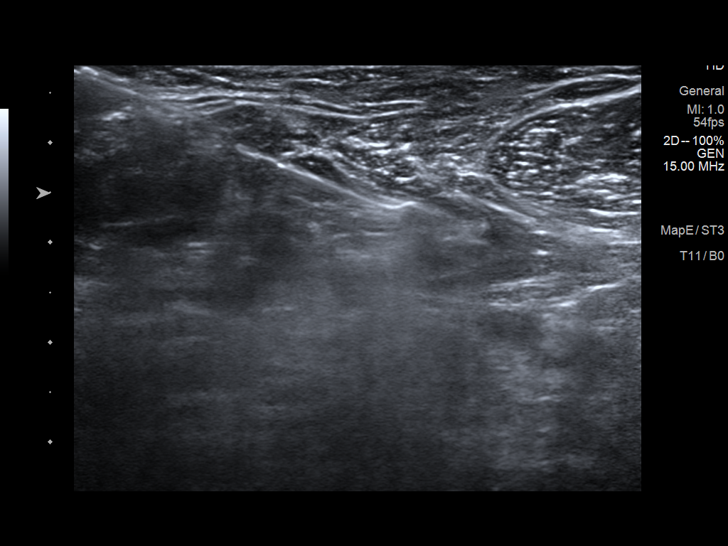
[im 8/9]
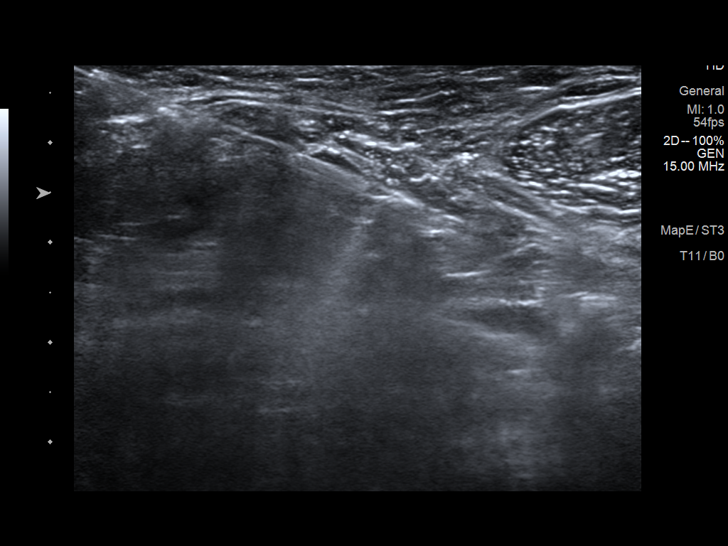
[im 9/9]
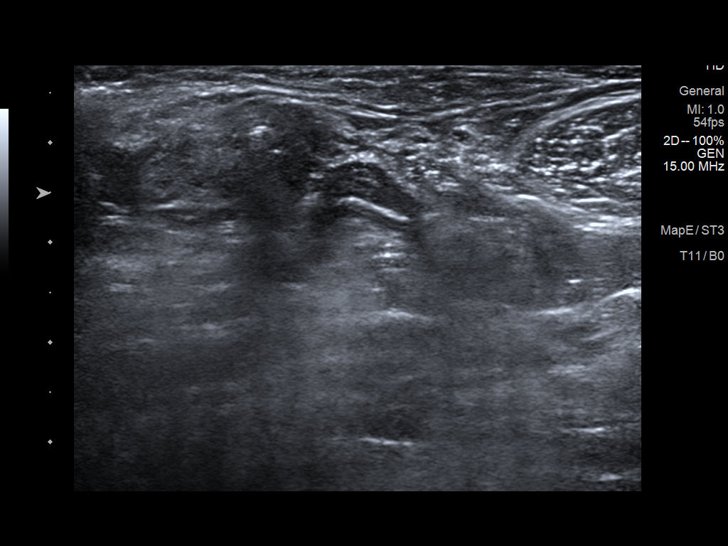

[9 of 9 positions shown; findings below may reference images not displayed]



Using sterile technique and 1% Lidocaine as local anesthetic, under
direct ultrasound visualization, a 14 gauge BALCI device was
used to perform biopsy of a left axillary lymph node using a
inferior approach. At the conclusion of the procedure HydroMARK
tissue marker clip was deployed into the biopsy cavity. Post-clip
mammogram was not performed as the HydroMARK clip was well seen
within the lymph node sonographically.
IMPRESSION: Ultrasound guided biopsy of a left axillary lymph node. No apparent
complications.

ADDENDUM:
Pathology revealed REACTIVE LYMPH NODE WITH PARACORTICAL HYPERPLASIA
AND DERMATOPATHIC CHANGE of the Left axilla. This was found to be
concordant by Dr. BALCI.

Pathology results were discussed with the patient by telephone. The
patient reported doing well after the biopsy with minimal tenderness
at the site. Post biopsy instructions and care were reviewed and
questions were answered. The patient was encouraged to call The
direct phone number was provided.

The patient was instructed to return for annual screening

Pathology results reported by BALCI, RN on [DATE].



Using sterile technique and 1% Lidocaine as local anesthetic, under
direct ultrasound visualization, a 14 gauge BALCI device was
used to perform biopsy of a left axillary lymph node using a
inferior approach. At the conclusion of the procedure HydroMARK
tissue marker clip was deployed into the biopsy cavity. Post-clip
mammogram was not performed as the HydroMARK clip was well seen
within the lymph node sonographically.
IMPRESSION: Ultrasound guided biopsy of a left axillary lymph node. No apparent
complications.

## 2019-10-22 LAB — SURGICAL PATHOLOGY

## 2020-07-15 ENCOUNTER — Observation Stay (HOSPITAL_COMMUNITY)
Admission: EM | Admit: 2020-07-15 | Discharge: 2020-07-16 | Disposition: A | Discharge status: Home / Self Care | Payer: 59 | Attending: Internal Medicine | Admitting: Internal Medicine

## 2020-07-15 ENCOUNTER — Emergency Department (HOSPITAL_COMMUNITY): Payer: 59

## 2020-07-15 ENCOUNTER — Emergency Department (HOSPITAL_BASED_OUTPATIENT_CLINIC_OR_DEPARTMENT_OTHER): Payer: 59

## 2020-07-15 ENCOUNTER — Other Ambulatory Visit (HOSPITAL_COMMUNITY): Payer: 59

## 2020-07-15 ENCOUNTER — Encounter (HOSPITAL_COMMUNITY): Payer: Self-pay

## 2020-07-15 DIAGNOSIS — E876 Hypokalemia: Secondary | ICD-10-CM | POA: Diagnosis present

## 2020-07-15 DIAGNOSIS — R030 Elevated blood-pressure reading, without diagnosis of hypertension: Secondary | ICD-10-CM

## 2020-07-15 DIAGNOSIS — I517 Cardiomegaly: Secondary | ICD-10-CM | POA: Diagnosis present

## 2020-07-15 DIAGNOSIS — I10 Essential (primary) hypertension: Secondary | ICD-10-CM | POA: Diagnosis not present

## 2020-07-15 DIAGNOSIS — R5381 Other malaise: Secondary | ICD-10-CM

## 2020-07-15 DIAGNOSIS — Z79899 Other long term (current) drug therapy: Secondary | ICD-10-CM | POA: Insufficient documentation

## 2020-07-15 DIAGNOSIS — G459 Transient cerebral ischemic attack, unspecified: Principal | ICD-10-CM | POA: Insufficient documentation

## 2020-07-15 DIAGNOSIS — I160 Hypertensive urgency: Secondary | ICD-10-CM | POA: Diagnosis not present

## 2020-07-15 DIAGNOSIS — E785 Hyperlipidemia, unspecified: Secondary | ICD-10-CM | POA: Diagnosis present

## 2020-07-15 DIAGNOSIS — Z20822 Contact with and (suspected) exposure to covid-19: Secondary | ICD-10-CM | POA: Diagnosis not present

## 2020-07-15 DIAGNOSIS — I161 Hypertensive emergency: Secondary | ICD-10-CM

## 2020-07-15 DIAGNOSIS — F17200 Nicotine dependence, unspecified, uncomplicated: Secondary | ICD-10-CM | POA: Insufficient documentation

## 2020-07-15 DIAGNOSIS — I6389 Other cerebral infarction: Secondary | ICD-10-CM | POA: Diagnosis not present

## 2020-07-15 DIAGNOSIS — R531 Weakness: Secondary | ICD-10-CM | POA: Diagnosis present

## 2020-07-15 DIAGNOSIS — R7303 Prediabetes: Secondary | ICD-10-CM | POA: Diagnosis not present

## 2020-07-15 DIAGNOSIS — R200 Anesthesia of skin: Secondary | ICD-10-CM

## 2020-07-15 HISTORY — DX: Hypokalemia: E87.6

## 2020-07-15 HISTORY — DX: Hypertensive emergency: I16.1

## 2020-07-15 LAB — LIPID PANEL
Cholesterol: 208 mg/dL — ABNORMAL HIGH (ref 0–200)
HDL: 56 mg/dL (ref >40)
LDL Cholesterol: 139 mg/dL — ABNORMAL HIGH (ref 0–99)
Total CHOL/HDL Ratio: 3.7 RATIO
Triglycerides: 63 mg/dL (ref <150)
VLDL: 13 mg/dL (ref 0–40)

## 2020-07-15 LAB — DIFFERENTIAL
Abs Immature Granulocytes: 0.06 10*3/uL (ref 0.00–0.07)
Basophils Absolute: 0.1 10*3/uL (ref 0.0–0.1)
Basophils Relative: 1 %
Eosinophils Absolute: 0.2 10*3/uL (ref 0.0–0.5)
Eosinophils Relative: 2 %
Immature Granulocytes: 1 %
Lymphocytes Relative: 30 %
Lymphs Abs: 3.2 10*3/uL (ref 0.7–4.0)
Monocytes Absolute: 0.8 10*3/uL (ref 0.1–1.0)
Monocytes Relative: 8 %
Neutro Abs: 6.1 10*3/uL (ref 1.7–7.7)
Neutrophils Relative %: 58 %

## 2020-07-15 LAB — CBC
HCT: 43.2 % (ref 36.0–46.0)
Hemoglobin: 13.8 g/dL (ref 12.0–15.0)
MCH: 26.1 pg (ref 26.0–34.0)
MCHC: 31.9 g/dL (ref 30.0–36.0)
MCV: 81.8 fL (ref 80.0–100.0)
Platelets: 263 10*3/uL (ref 150–400)
RBC: 5.28 MIL/uL — ABNORMAL HIGH (ref 3.87–5.11)
RDW: 14.8 % (ref 11.5–15.5)
WBC: 10.4 10*3/uL (ref 4.0–10.5)
nRBC: 0 % (ref 0.0–0.2)

## 2020-07-15 LAB — COMPREHENSIVE METABOLIC PANEL
ALT: 14 U/L (ref 0–44)
AST: 20 U/L (ref 15–41)
Albumin: 3.8 g/dL (ref 3.5–5.0)
Alkaline Phosphatase: 59 U/L (ref 38–126)
Anion gap: 10 (ref 5–15)
BUN: 5 mg/dL — ABNORMAL LOW (ref 6–20)
CO2: 20 mmol/L — ABNORMAL LOW (ref 22–32)
Calcium: 9.2 mg/dL (ref 8.9–10.3)
Chloride: 107 mmol/L (ref 98–111)
Creatinine, Ser: 0.67 mg/dL (ref 0.44–1.00)
GFR, Estimated: >60 mL/min (ref >60)
Glucose, Bld: 108 mg/dL — ABNORMAL HIGH (ref 70–99)
Potassium: 3.4 mmol/L — ABNORMAL LOW (ref 3.5–5.1)
Sodium: 137 mmol/L (ref 135–145)
Total Bilirubin: 0.5 mg/dL (ref 0.3–1.2)
Total Protein: 7.2 g/dL (ref 6.5–8.1)

## 2020-07-15 LAB — ECHOCARDIOGRAM COMPLETE BUBBLE STUDY
AR max vel: 2.07 cm2
AV Area VTI: 1.92 cm2
AV Area mean vel: 1.82 cm2
AV Mean grad: 3 mmHg
AV Peak grad: 5.4 mmHg
Ao pk vel: 1.16 m/s
Area-P 1/2: 3.39 cm2
Calc EF: 60.5 %
S' Lateral: 2.2 cm
Single Plane A2C EF: 60.6 %
Single Plane A4C EF: 60.6 %

## 2020-07-15 LAB — I-STAT CHEM 8, ED
BUN: 6 mg/dL (ref 6–20)
Calcium, Ion: 1.21 mmol/L (ref 1.15–1.40)
Chloride: 107 mmol/L (ref 98–111)
Creatinine, Ser: 0.5 mg/dL (ref 0.44–1.00)
Glucose, Bld: 106 mg/dL — ABNORMAL HIGH (ref 70–99)
HCT: 45 % (ref 36.0–46.0)
Hemoglobin: 15.3 g/dL — ABNORMAL HIGH (ref 12.0–15.0)
Potassium: 3.3 mmol/L — ABNORMAL LOW (ref 3.5–5.1)
Sodium: 142 mmol/L (ref 135–145)
TCO2: 22 mmol/L (ref 22–32)

## 2020-07-15 LAB — CBG MONITORING, ED: Glucose-Capillary: 106 mg/dL — ABNORMAL HIGH (ref 70–99)

## 2020-07-15 LAB — HIV ANTIBODY (ROUTINE TESTING W REFLEX): HIV Screen 4th Generation wRfx: NONREACTIVE

## 2020-07-15 LAB — APTT: aPTT: 24 seconds (ref 24–36)

## 2020-07-15 LAB — I-STAT BETA HCG BLOOD, ED (MC, WL, AP ONLY): I-stat hCG, quantitative: <5 m[IU]/mL (ref <5)

## 2020-07-15 LAB — HEMOGLOBIN A1C
Hgb A1c MFr Bld: 6.7 % — ABNORMAL HIGH (ref 4.8–5.6)
Mean Plasma Glucose: 145.59 mg/dL

## 2020-07-15 LAB — RESP PANEL BY RT-PCR (FLU A&B, COVID) ARPGX2
Influenza A by PCR: NEGATIVE
Influenza B by PCR: NEGATIVE
SARS Coronavirus 2 by RT PCR: NEGATIVE

## 2020-07-15 LAB — PROTIME-INR
INR: 1 (ref 0.8–1.2)
Prothrombin Time: 13 seconds (ref 11.4–15.2)

## 2020-07-15 IMAGING — CT CT HEAD CODE STROKE
3 series · 16 of 47 positions shown, 19 images · non-contrast
Comparison: CT head [DATE]

CLINICAL DATA: Code stroke. Right-sided weakness. Acute neuro
deficit

EXAM:
CT HEAD WITHOUT CONTRAST
TECHNIQUE: Contiguous axial images were obtained from the base of the skull
through the vertex without intravenous contrast.

[Series 3: head 5.0 st · axial · 0.42mm/px · z∈[+1274,+1424]mm · 10 of 36 slices shown, 13 images]
[im 3/36  brain]
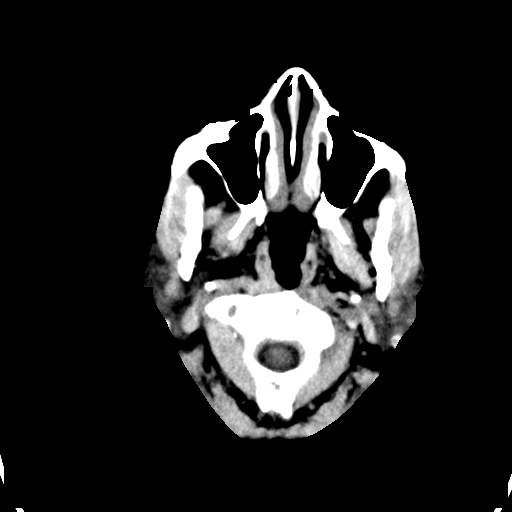
[im 3/36  bone]
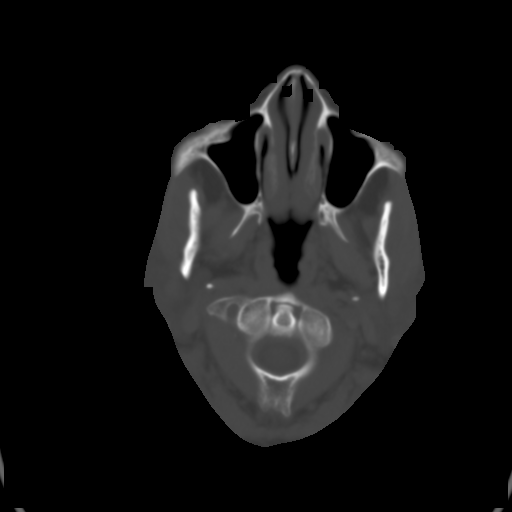
[im 7/36  brain]
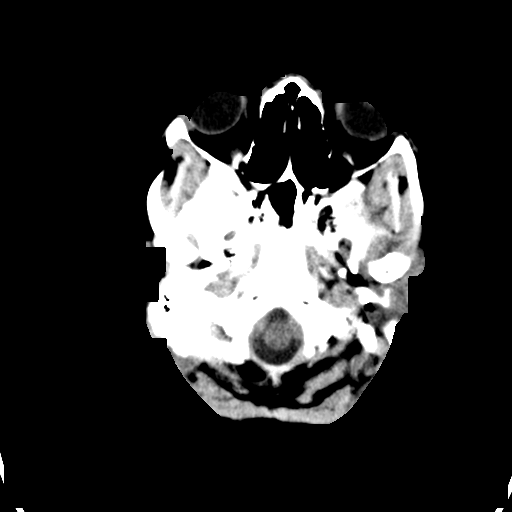
[im 10/36  brain]
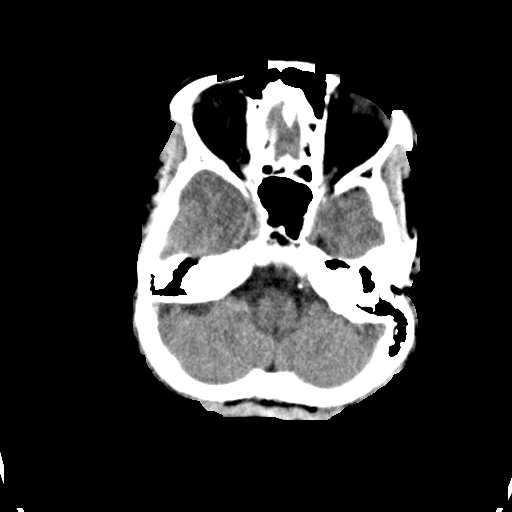
[im 13/36  brain]
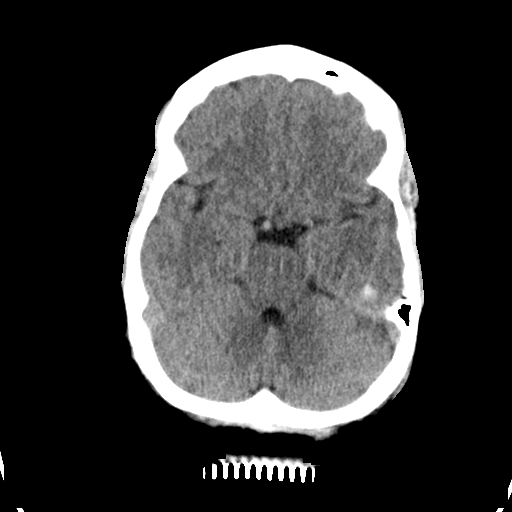
[im 16/36  brain]
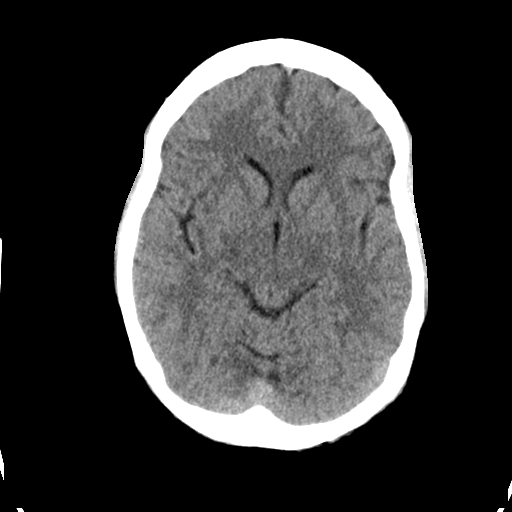
[im 16/36  bone]
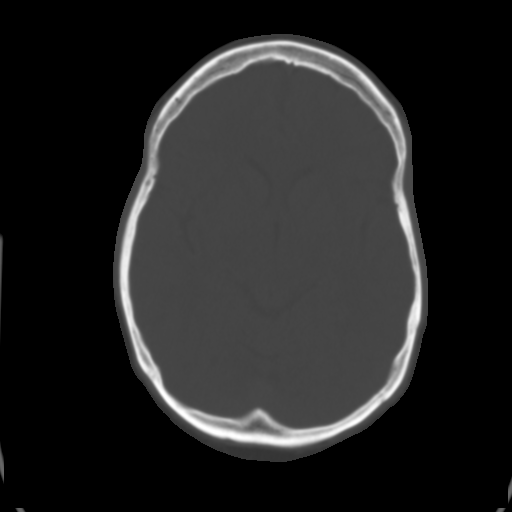
[im 20/36  brain]
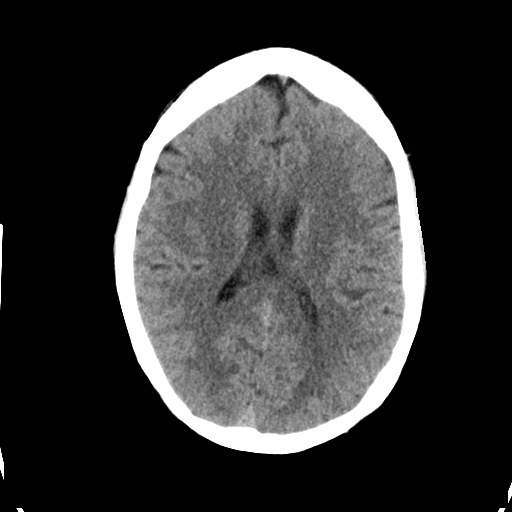
[im 23/36  brain]
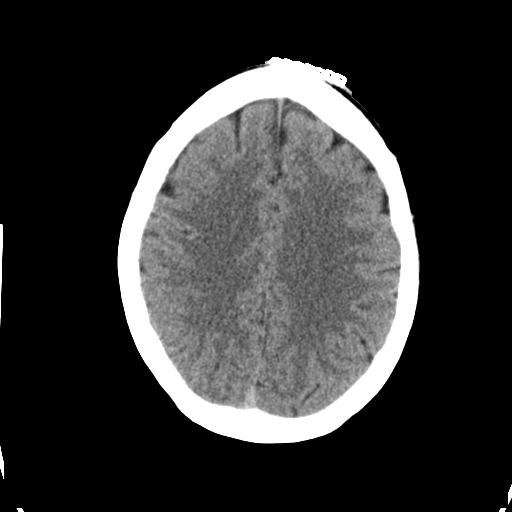
[im 27/36  brain]
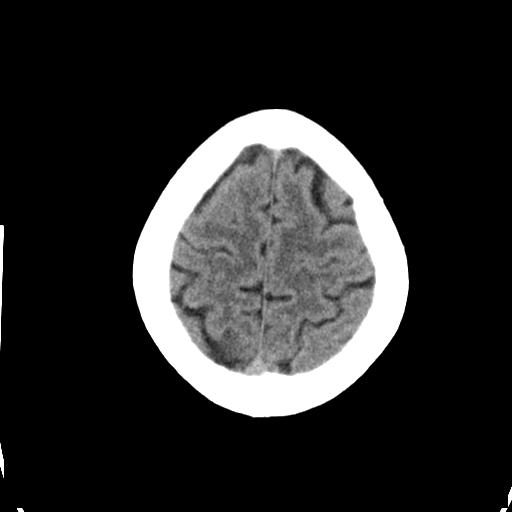
[im 29/36  brain]
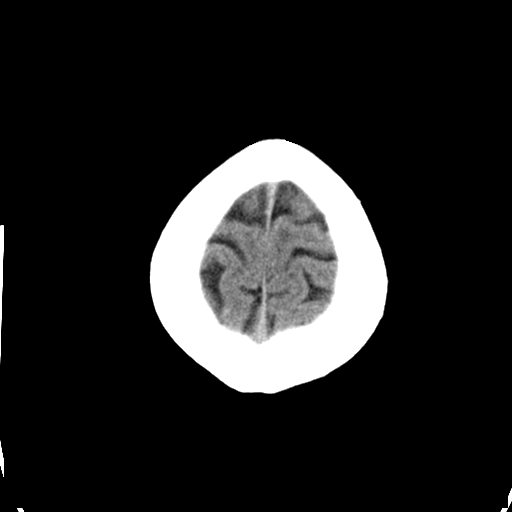
[im 29/36  bone]
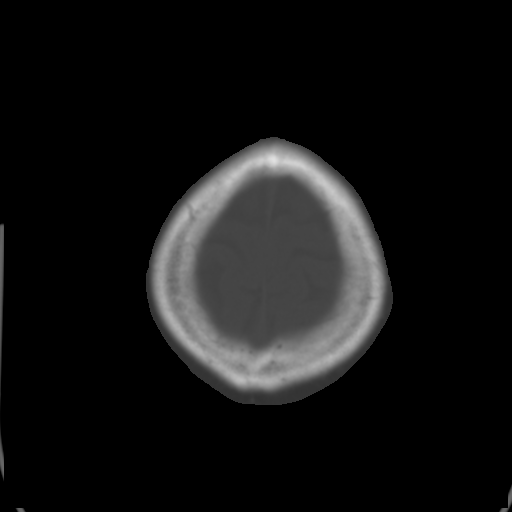
[im 33/36  brain]
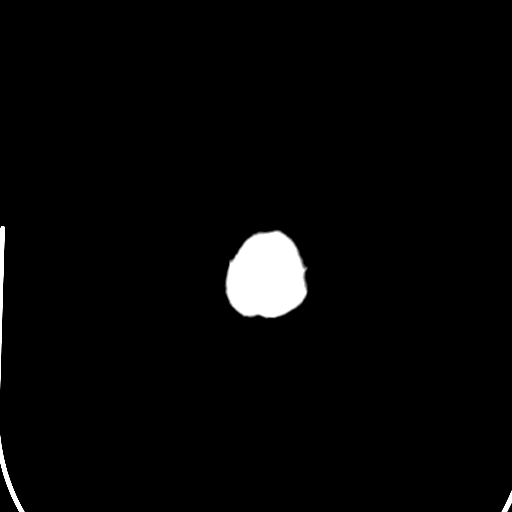

[Series 5: head 3.0 cor st · coronal · 0.32mm/px · 3 of 67 slices shown]
[im 23/67  brain]
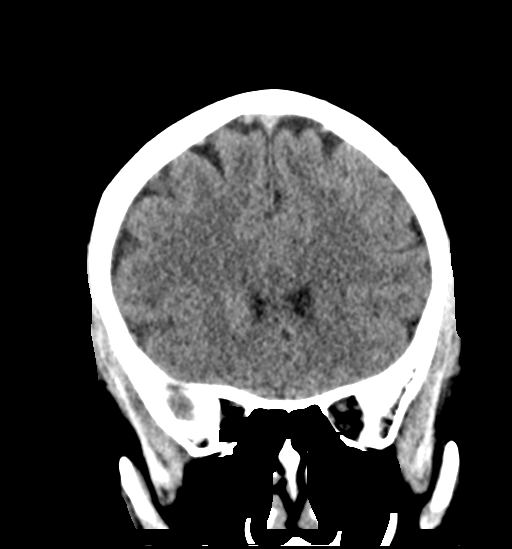
[im 30/67  brain]
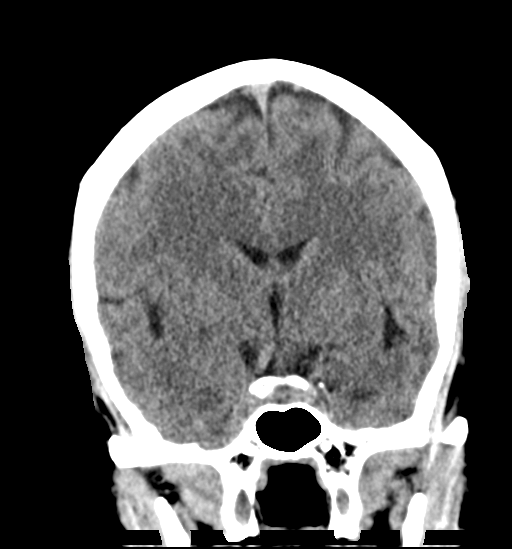
[im 37/67  brain]
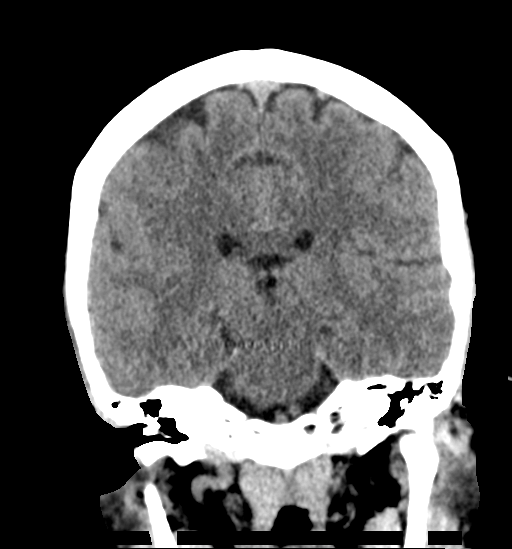

[Series 6: head 3.0 sag st · sagittal · 0.35mm/px · 3 of 57 slices shown]
[im 19/57  brain]
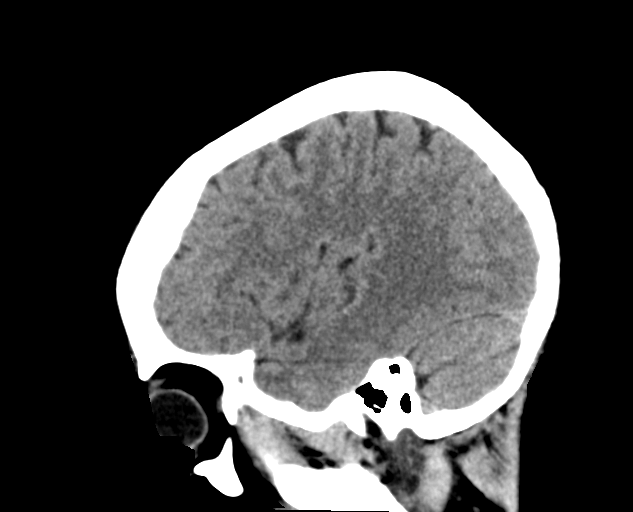
[im 29/57  brain]
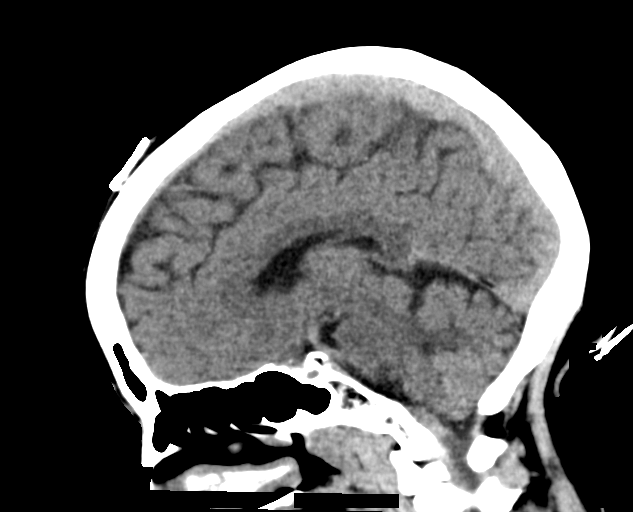
[im 38/57  brain]
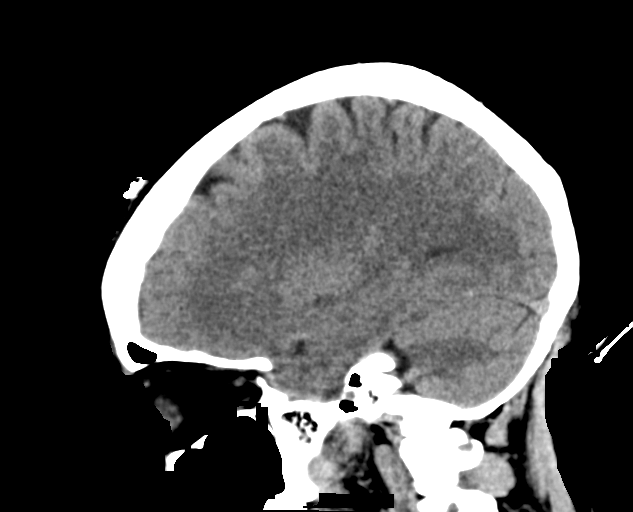

[16 of 47 positions shown; findings below may reference images not displayed]

FINDINGS: Brain: No evidence of acute infarction, hemorrhage, hydrocephalus,
extra-axial collection or mass lesion/mass effect.

Vascular: Negative for hyperdense vessel

Skull: Negative

Sinuses/Orbits: Negative

Other: None

ASPECTS (Alberta Stroke Program Early CT Score)

- Ganglionic level infarction (caudate, lentiform nuclei, internal
capsule, insula, M1-M3 cortex): 7

- Supraganglionic infarction (M4-M6 cortex): 3

Total score (0-10 with 10 being normal): 10
IMPRESSION: 1. Negative CT head
2. ASPECTS is 10
3. Code stroke imaging results were communicated on [DATE] at
[DATE] to provider GI[facility] via text page

## 2020-07-15 IMAGING — CT CT CODE STROKE CTA
2 of 7 series · 8 of 33 positions shown · IV contrast (OMNI 350)
Comparison: Plain head CT [4A] hours today.

CLINICAL DATA: 49-year-old female with code stroke presentation,
right side symptoms.

EXAM:
CT ANGIOGRAPHY HEAD AND NECK
TECHNIQUE: Multidetector CT imaging of the head and neck was performed using
the standard protocol during bolus administration of intravenous
contrast. Multiplanar CT image reconstructions and MIPs were
obtained to evaluate the vascular anatomy. Carotid stenosis
measurements (when applicable) are obtained utilizing NASCET
criteria, using the distal internal carotid diameter as the
denominator.
CONTRAST:  75mL OMNIPAQUE IOHEXOL 350 MG/ML SOLN

[Series 5: cta neck · axial · 0.43mm/px · z∈[-234,-120]mm · 2 of 173 slices shown]
[im 58/173  soft-tissue]
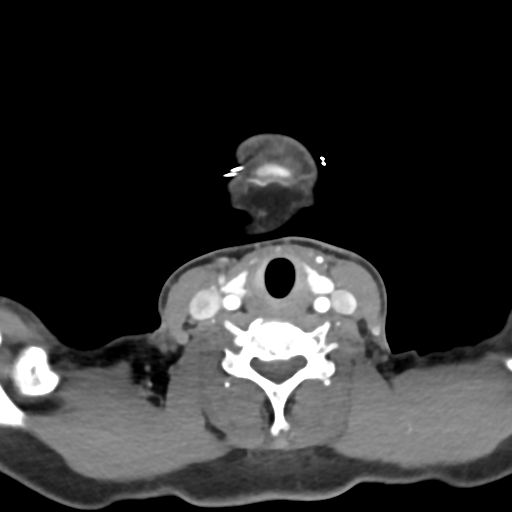
[im 115/173  soft-tissue]
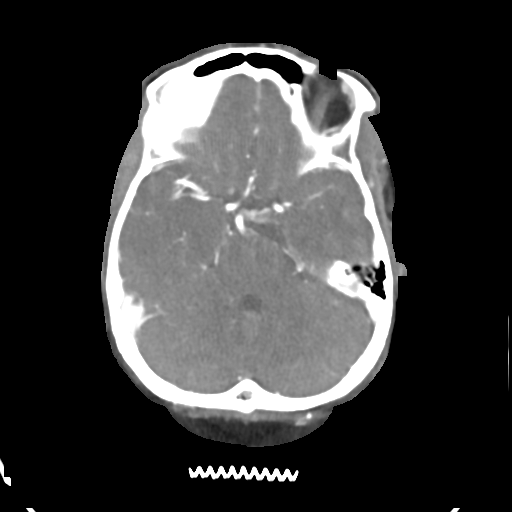

[Series 7: cta neck axial · axial · 0.39mm/px · z∈[-304,-60]mm · 6 of 344 slices shown]
[im 50/344  soft-tissue]
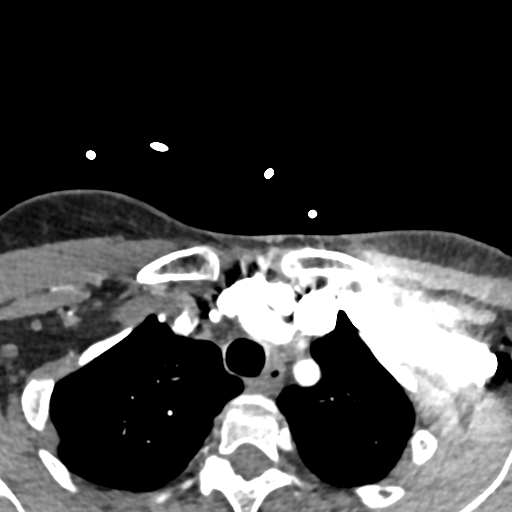
[im 99/344  bone]
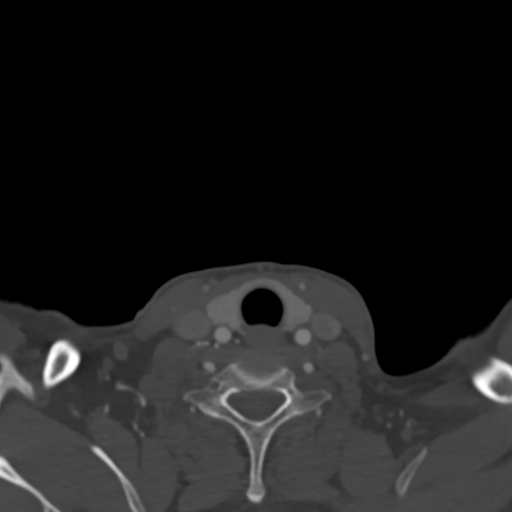
[im 148/344  soft-tissue]
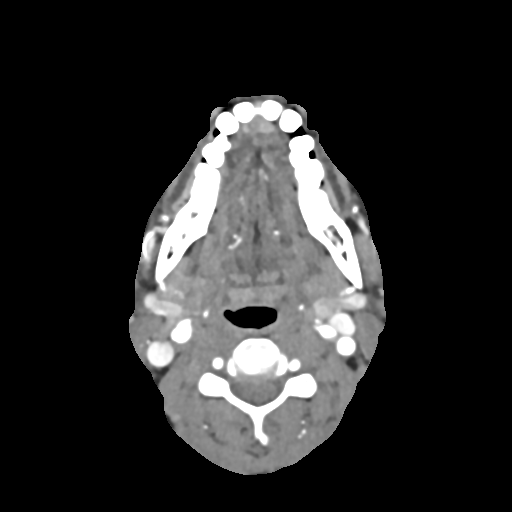
[im 197/344  bone]
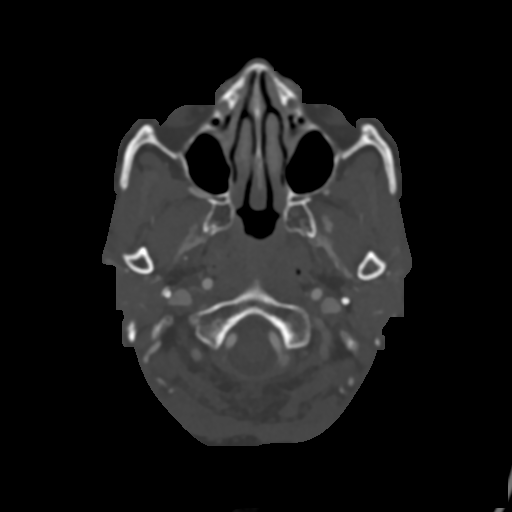
[im 246/344  soft-tissue]
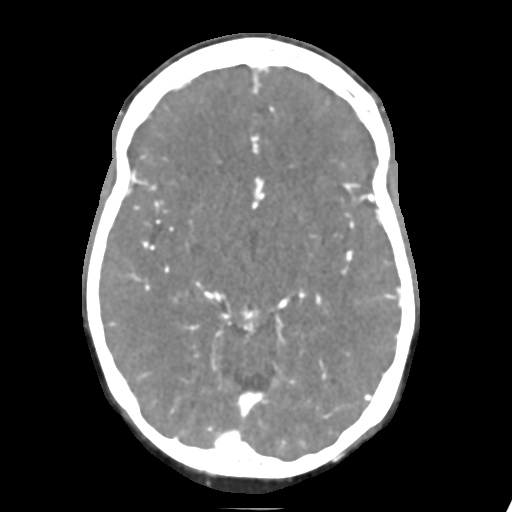
[im 295/344  bone]
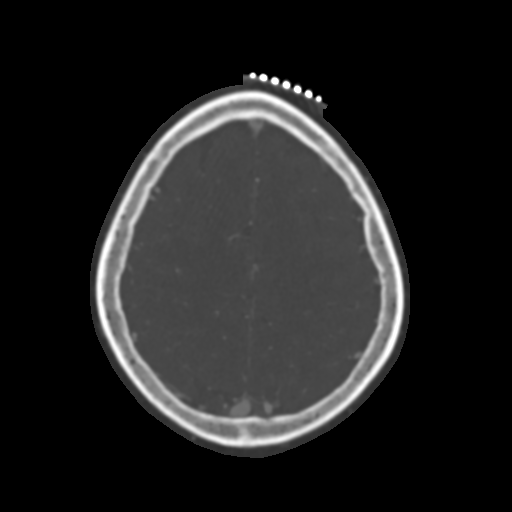

[8 of 33 positions shown; findings below may reference images not displayed]

FINDINGS: CTA NECK

Skeleton: Occasional carious dentition. Reversal of cervical
lordosis. No acute osseous abnormality identified.

Upper chest: Negative.

Other neck: Negative.

Aortic arch: Bovine type arch configuration. No arch
atherosclerosis.

Right carotid system: Normal brachiocephalic artery and right CCA
origin without plaque or stenosis. Minimal soft plaque at the
posterior right ICA origin without stenosis. Normal right ICA bulb.
Tortuous but otherwise negative distal cervical right ICA.

Left carotid system: Bovine left CCA origin without plaque or
stenosis. Negative left carotid bifurcation and cervical left ICA.

Vertebral arteries:
Normal proximal right subclavian artery and right vertebral artery
origin. Right vertebral artery is normal aside from mild tortuosity
to the skull base.

Minimal soft plaque in the proximal left subclavian artery without
stenosis. Normal left vertebral artery origin. Codominant left
vertebral artery with mild tortuosity is normal to the skull base.

CTA HEAD

Posterior circulation: Mildly ectatic distal vertebral arteries and
basilar without plaque or stenosis. Normal PICA origins,
vertebrobasilar junction, SCA and PCA origins. Tortuous basilar tip.
Posterior communicating arteries are diminutive or absent. Bilateral
PCA branches are normal aside from mild tortuosity.

Anterior circulation: Both ICA siphons are patent with mild
tortuosity and minimal plaque. No siphon stenosis. Normal ophthalmic
artery origins. Patent carotid termini with normal MCA and ACA
origins. Anterior communicating artery and bilateral ACA branches
are normal aside from tortuosity. Left MCA M1 segment, trifurcation
and left MCA branches are within normal limits.

Right MCA M1 segment and trifurcation are patent without stenosis.
Right MCA branches are within normal limits.

Venous sinuses: Patent.

Anatomic variants: None.

Review of the MIP images confirms the above findings
IMPRESSION: 1. Negative for large vessel occlusion.
2. Generalized arterial tortuosity but minimal atherosclerosis in
the head and neck. No arterial stenosis.

These results were communicated to Dr. TURPIN at [DATE] TURPIN
[DATE]by text page via the AMION messaging system.

## 2020-07-15 IMAGING — MR MR HEAD W/O CM
5 of 10 series · 24 of 48 positions shown · non-contrast
Comparison: Noncontrast head CT and CT angiogram head/neck
[DATE].

CLINICAL DATA: Neuro deficit, acute, stroke suspected; acute right
lower extremity numbness/weakness.

EXAM:
MRI HEAD WITHOUT CONTRAST
TECHNIQUE: Multiplanar, multiecho pulse sequences of the brain and surrounding
structures were obtained without intravenous contrast.

[Series 2: DWI · axial · 3.0mm · 0.94mm/px · z∈[-96,+46]mm · 8 of 99 slices shown (1 of 2)]
[im 1/99]
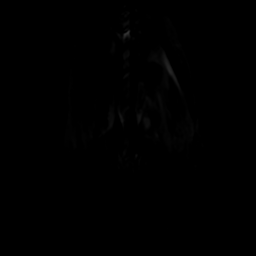
[im 11/99]
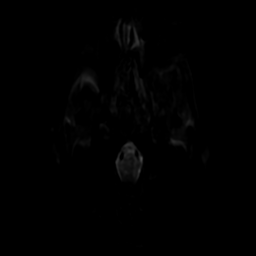
[im 33/99]
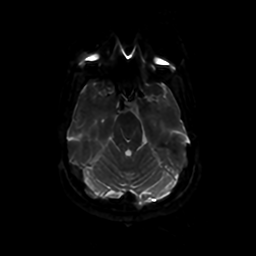
[im 44/99]
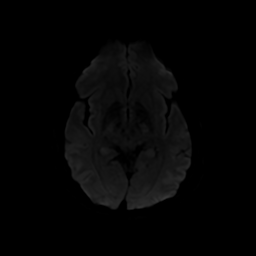
[im 55/99]
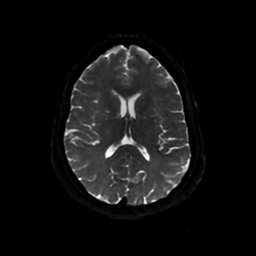
[im 66/99]
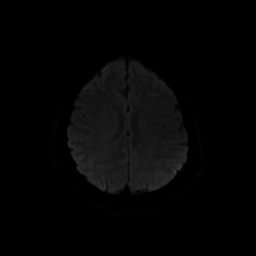
[im 88/99]
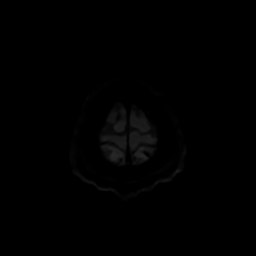
[im 99/99]
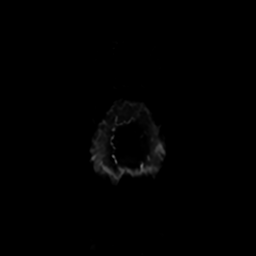

[Series 3: FLAIR · axial · 3.0mm · 0.45mm/px · z∈[-88,+40]mm · 3 of 23 slices shown (1 of 2)]
[im 1/23]
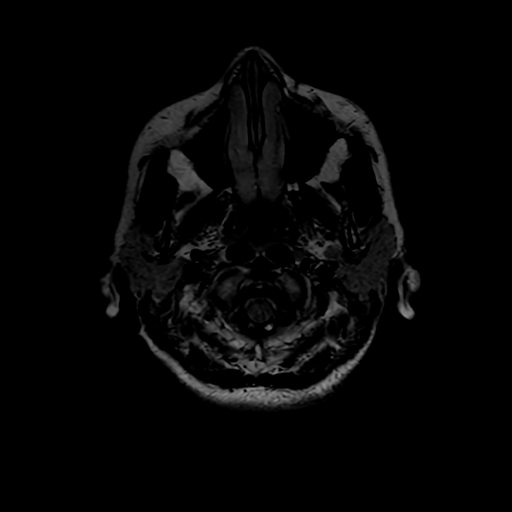
[im 12/23]
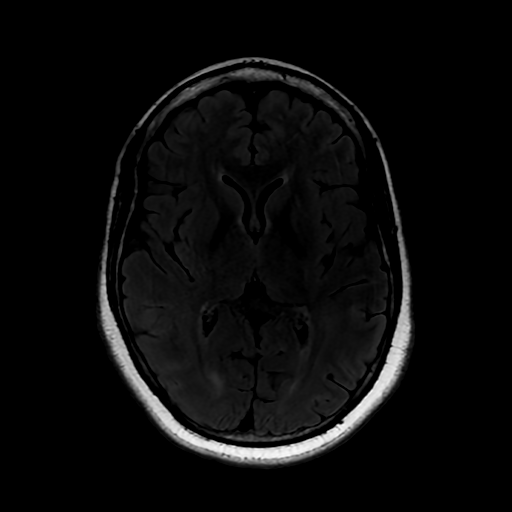
[im 23/23]
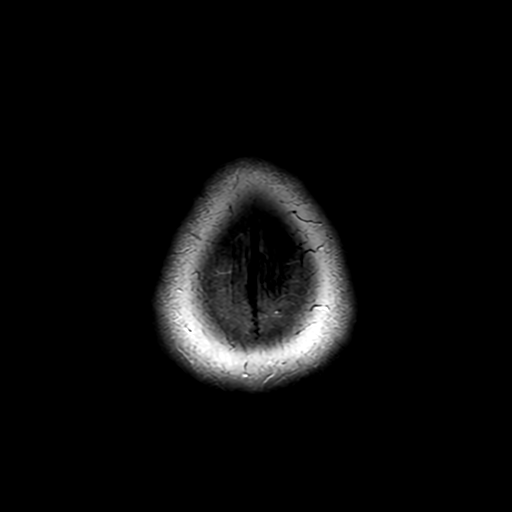

[Series 4: DWI · coronal · 4.0mm · 0.94mm/px · 8 of 70 slices shown (2 of 2)]
[im 1/70]
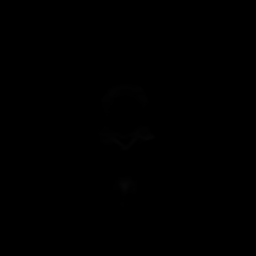
[im 10/70]
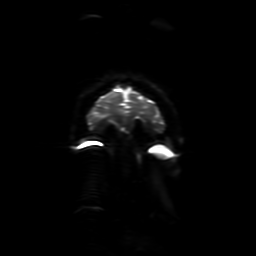
[im 20/70]
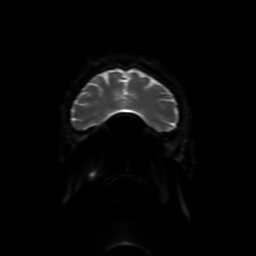
[im 30/70]
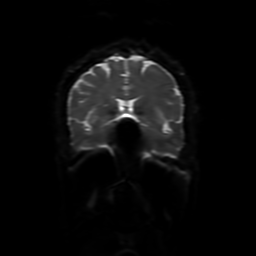
[im 40/70]
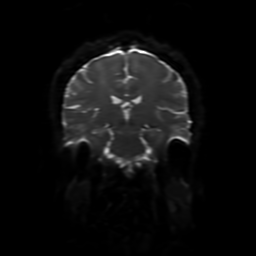
[im 50/70]
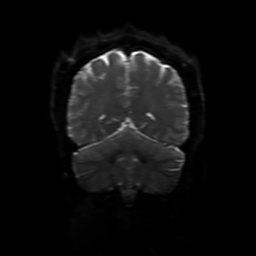
[im 60/70]
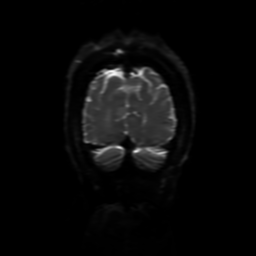
[im 70/70]
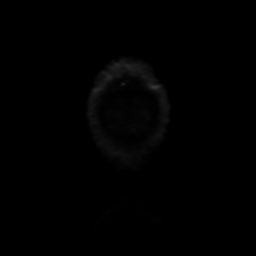

[Series 7: FLAIR · sagittal · 5.0mm · 0.23mm/px · 3 of 23 slices shown (2 of 2)]
[im 1/23]
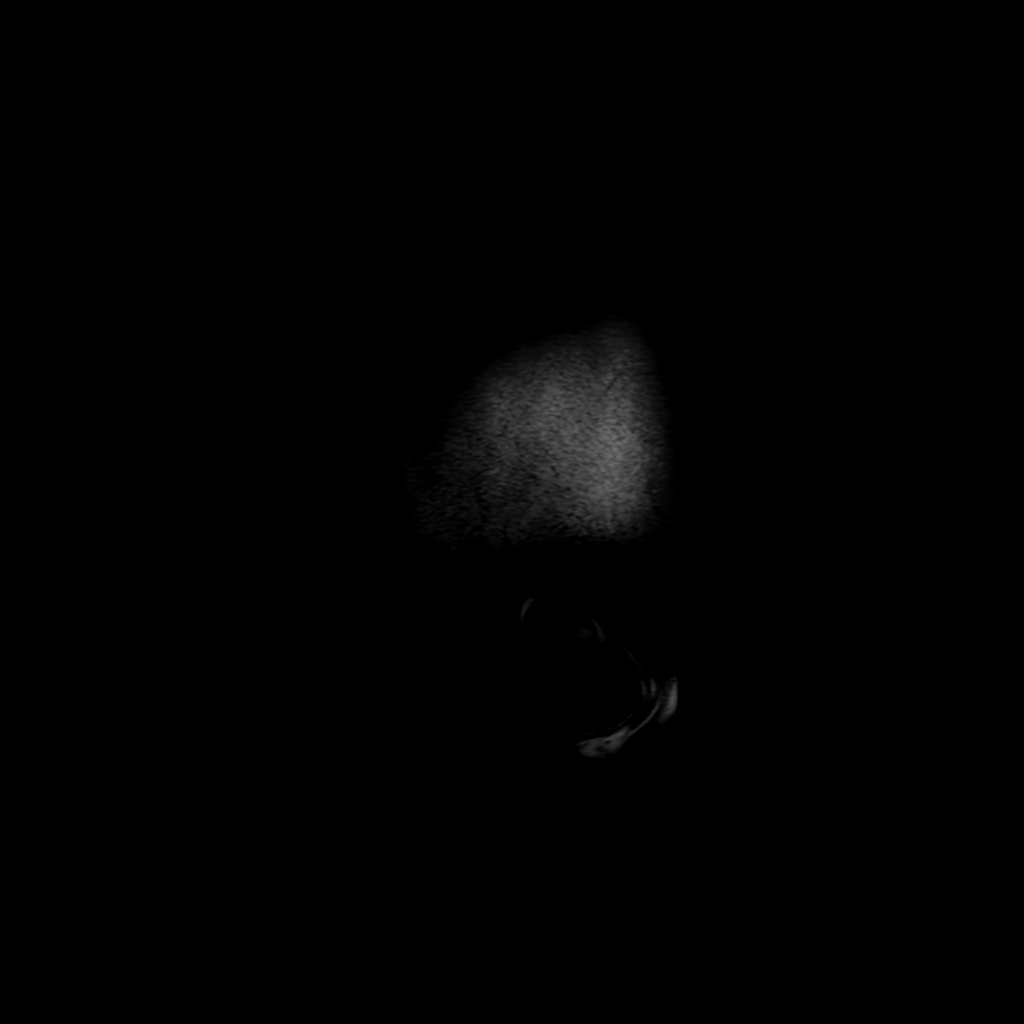
[im 12/23]
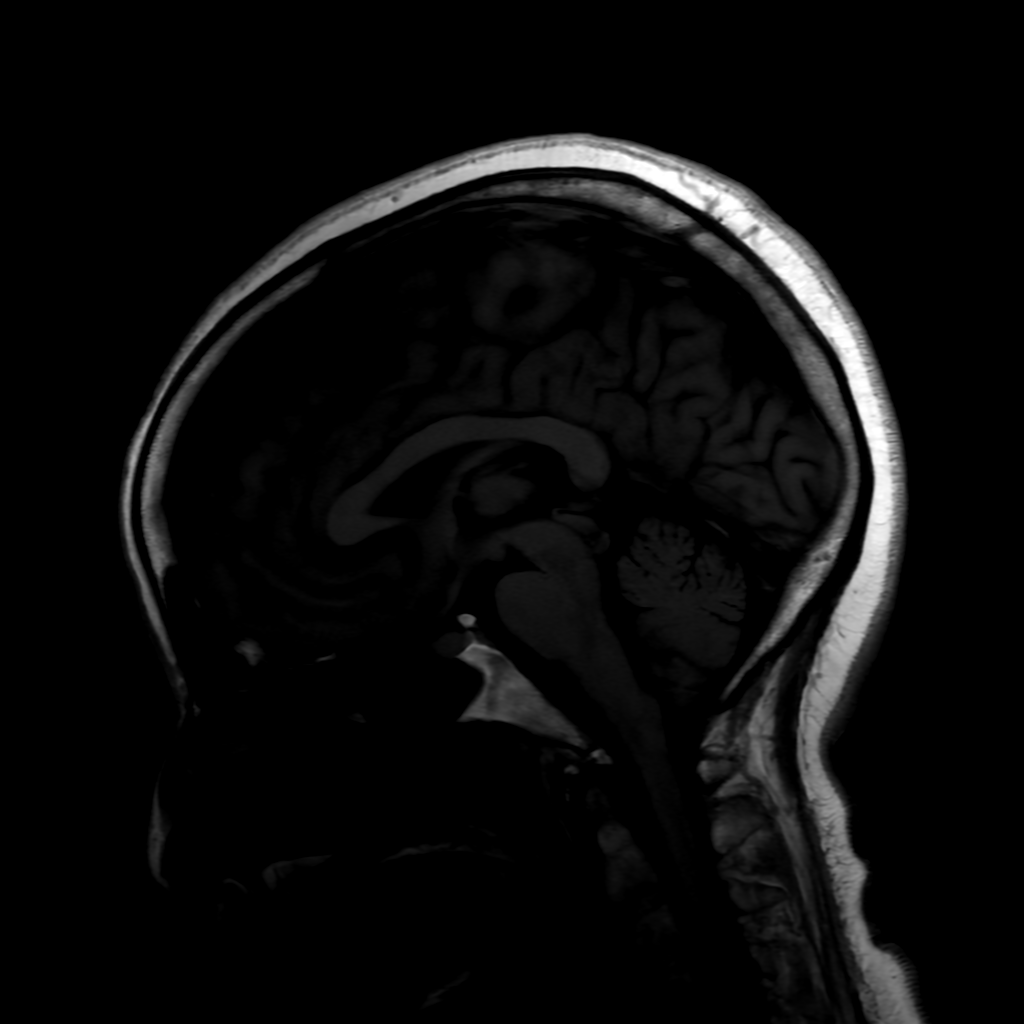
[im 23/23]
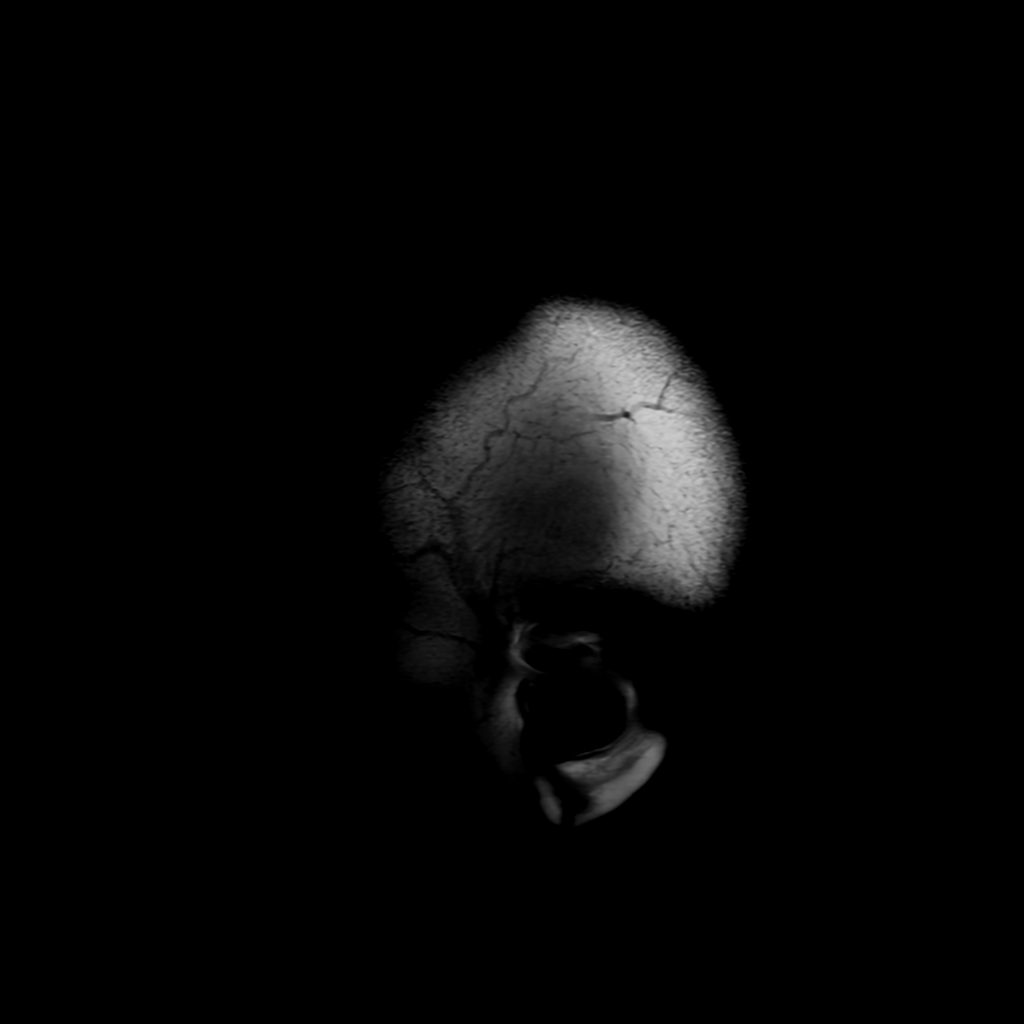

[Series 250: ADC · axial · 3.0mm · 0.94mm/px · z∈[-96,-70]mm · 2 of 50 slices shown]
[im 1/50]
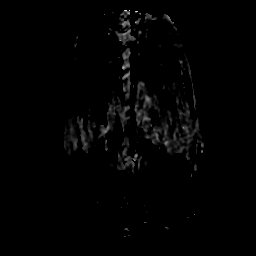
[im 10/50]
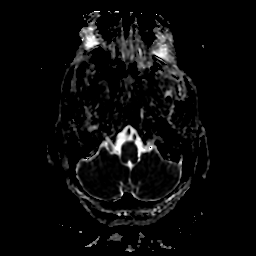

[24 of 48 positions shown; findings below may reference images not displayed]

FINDINGS: Brain:

Cerebral volume is normal.

Mild scattered T2/FLAIR hyperintensity within the cerebral white
matter which is nonspecific, but most often secondary to chronic
small vessel ischemia.

There is no acute infarct.

No evidence of intracranial mass.

No chronic intracranial blood products.

No extra-axial fluid collection.

No midline shift.

Vascular: Expected proximal arterial flow voids.

Skull and upper cervical spine: No focal marrow lesion.

Sinuses/Orbits: Visualized orbits show no acute finding. Trace
bilateral ethmoid sinus mucosal thickening
IMPRESSION: No evidence of acute intracranial abnormality.

Mild scattered T2/FLAIR hyperintensity within the cerebral white
matter which is nonspecific, but most often secondary to chronic
small vessel ischemia.

## 2020-07-15 MED ORDER — ASPIRIN 325 MG PO TABS
325.0000 mg | ORAL_TABLET | Freq: Every day | ORAL | Status: DC
  Administered 2020-07-15 – 2020-07-16 (×2): 325 mg via ORAL
  Filled 2020-07-15 (×3): qty 1

## 2020-07-15 MED ORDER — LABETALOL HCL 5 MG/ML IV SOLN
10.0000 mg | Freq: Once | INTRAVENOUS | Status: AC
  Administered 2020-07-15: 10 mg via INTRAVENOUS

## 2020-07-15 MED ORDER — SENNOSIDES-DOCUSATE SODIUM 8.6-50 MG PO TABS: 1.0000 | ORAL_TABLET | Freq: Every evening | ORAL | Status: DC | PRN

## 2020-07-15 MED ORDER — LABETALOL HCL 5 MG/ML IV SOLN
10.0000 mg | INTRAVENOUS | Status: DC | PRN
  Administered 2020-07-15 – 2020-07-16 (×3): 10 mg via INTRAVENOUS
  Filled 2020-07-15 (×4): qty 4

## 2020-07-15 MED ORDER — ASPIRIN 300 MG RE SUPP: 300.0000 mg | Freq: Every day | RECTAL | Status: DC

## 2020-07-15 MED ORDER — ACETAMINOPHEN 650 MG RE SUPP: 650.0000 mg | RECTAL | Status: DC | PRN

## 2020-07-15 MED ORDER — CLEVIDIPINE BUTYRATE 0.5 MG/ML IV EMUL
0.0000 mg/h | INTRAVENOUS | Status: DC
  Administered 2020-07-15: 1 mg/h via INTRAVENOUS

## 2020-07-15 MED ORDER — SODIUM CHLORIDE 0.9% FLUSH
3.0000 mL | Freq: Once | INTRAVENOUS | Status: AC
  Administered 2020-07-15: 3 mL via INTRAVENOUS

## 2020-07-15 MED ORDER — ENOXAPARIN SODIUM 40 MG/0.4ML ~~LOC~~ SOLN
40.0000 mg | SUBCUTANEOUS | Status: DC
  Administered 2020-07-15 – 2020-07-16 (×2): 40 mg via SUBCUTANEOUS
  Filled 2020-07-15 (×3): qty 0.4

## 2020-07-15 MED ORDER — ACETAMINOPHEN 325 MG PO TABS
650.0000 mg | ORAL_TABLET | ORAL | Status: DC | PRN
  Administered 2020-07-15 – 2020-07-16 (×3): 650 mg via ORAL
  Filled 2020-07-15 (×3): qty 2

## 2020-07-15 MED ORDER — IOHEXOL 350 MG/ML SOLN
75.0000 mL | Freq: Once | INTRAVENOUS | Status: AC | PRN
  Administered 2020-07-15: 75 mL via INTRAVENOUS

## 2020-07-15 MED ORDER — ACETAMINOPHEN 160 MG/5ML PO SOLN: 650.0000 mg | ORAL | Status: DC | PRN

## 2020-07-15 MED ORDER — STROKE: EARLY STAGES OF RECOVERY BOOK: Freq: Once | Status: DC

## 2020-07-15 MED ORDER — LISINOPRIL 20 MG PO TABS: 40.0000 mg | ORAL_TABLET | Freq: Every day | ORAL | Status: DC

## 2020-07-15 NOTE — Progress Notes (Signed)
  Echocardiogram 2D Echocardiogram has been performed.  Leah Rose 07/15/2020, 3:31 PM

## 2020-07-15 NOTE — Progress Notes (Signed)
10 mg labetelol given at 0914 forS BP 209

## 2020-07-15 NOTE — ED Notes (Signed)
Pt returned from MRI °

## 2020-07-15 NOTE — ED Notes (Signed)
Pt transported to MRI 

## 2020-07-15 NOTE — Progress Notes (Signed)
Cleviprex increased to 2 at [phone]

## 2020-07-15 NOTE — Progress Notes (Signed)
cleviprex started at 0921 for BP199/103 at 1

## 2020-07-15 NOTE — Consult Note (Signed)
Neurology Consultation  Reason for Consult: Right lower extremity numbness, dizziness Referring Physician: Dr. Rush Landmark  CC: Right lower extremity numbness, dizziness  History is obtained from: Patient, EMS  HPI: Leah Rose is a 50 y.o. female with a medical history significant for essential hypertension, chronic venous insufficiency, moderate recurrent major depression, and tobacco use who presented to [facility] via EMS after experiencing a sudden onset of diaphoresis, dizziness, right lower extremity numbness, and blurred vision while at work this morning at 08:00. She was found to have a blood pressure of 206/123 and was activated as a stroke alert via EMS. On arrival, she was found to have a subtle right mouth droop, right arm weakness / heaviness, right leg weakness and numbness, and an unstable gait. Ms. Cartwright states that her blood pressure usually runs very high but she endorses compliance with home Lisinopril.   LKW: 08:00 tpa given?: no, tPA was offered to patient twice with explanation of risks and benefits but patient refused.  IR Thrombectomy? No, low suspicion for LVO on examination and initial vessel imaging.  Modified Rankin Scale: 0-Completely asymptomatic and back to baseline post- stroke  ROS: A complete ROS was performed and is negative except as noted in the HPI.   Past Medical History:  Diagnosis Date  . Chronic venous insufficiency 06/23/2017  . Essential hypertension 03/14/2006  . 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 Tobacco abuse 03/14/2006   Family History  Problem Relation Age of Onset  . Seizures Mother   . Cirrhosis Mother   . Birth defects Sister   . Hypertension Sister   . Breast cancer Maternal Grandmother   . Thyroid disease Sister    Social History:   reports that she has been smoking. She has been smoking about 1.00 pack per day. She has never used smokeless tobacco. She reports current alcohol use of about 14.0 standard drinks  of alcohol per week. She reports that she does not use drugs.  Medications  Current Facility-Administered Medications:  .  sodium chloride flush (NS) 0.9 % injection 3 mL, 3 mL, Intravenous, Once, Tegeler, Canary Brim, MD  Current Outpatient Medications:  .  lisinopril (ZESTRIL) 40 MG tablet, Take 1 tablet by mouth once daily, Disp: 90 tablet, Rfl: 1 .  sertraline (ZOLOFT) 50 MG tablet, Take 1 tablet (50 mg total) by mouth daily., Disp: 30 tablet, Rfl: 2  Exam: Current vital signs: Ht 4\' 11"  (1.499 m)   Wt 49.4 kg   BMI 22.00 kg/m  Vital signs in last 24 hours: Temp:  [98.3 F (36.8 C)] 98.3 F (36.8 C) (04/13 0946) Pulse Rate:  [93] 93 (04/13 0941) Resp:  [18] 18 (04/13 0941) BP: (188)/(120) 188/120 (04/13 0941) SpO2:  [100 %] 100 % (04/13 0941) Weight:  [49.4 kg] 49.4 kg (04/13 0900)  GENERAL: Awake, alert, appears anxious, she is in no acute distress Head: Normocephalic and atraumatic, dry mm EENT: Normal conjunctivae, no OP obstruction LUNGS - Normal respiratory effort. Non-labored breathing CV: Regular rate and rhythm on cardiac monitor ABDOMEN: Soft, nontender Ext: warm, without edema  NEURO:  Mental Status: Alert and oriented to self, place, age, month, and situation. Speech is hypophonic without aphasia or dysarthria. Naming intact with slight perseveration, comprehension, fluency, and repetition are intact. No neglect noted.  Cranial Nerves:  II: PERRL. Visual fields full.  III, IV, VI: EOMI. Lid elevation symmetric and full.  V: Sensation is intact to light touch and symmetrical to face.   VII: Face is asymmetric  resting and smiling with a subtle right mouth droop.  VIII: Hearing is intact to voice IX, X: Palate elevation is symmetric. Hypophonic.   XI: Normal sternocleidomastoid and trapezius muscle strength XII: Tongue protrudes midline. Motor: Motor examination fluctuates; most consistently she has 4/5 strength of the right upper and lower extremities  with some vertical drift noted. Left upper extremity has 5/5 strength without vertical drift. Left lower extremity fluctuates between 4/5 strength and 5/5 strength inconsistently with intermittent vertical drift.  Tone is normal. Bulk is normal.  Sensation: Decreased sensation to light touch on the right lower extremity compared to the left lower extremity. Sensation to light touch intact and symmetric in upper extremities.   Coordination: FTN intact bilaterally. HKS intact bilaterally.  DTRs: 2+ throughout.  Gait- Unsteady gait, requires assistance, takes small steps with decreased elevation of right lower extremity during ambulation.  NIHSS: 1a Level of Conscious.: 0 1b LOC Questions: 0 1c LOC Commands: 0 2 Best Gaze: 0 3 Visual: 0 4 Facial Palsy: 1 5a Motor Arm - left: 0 5b Motor Arm - Right: 1 6a Motor Leg - Left: 0 6b Motor Leg - Right: 1 7 Limb Ataxia: 0 8 Sensory: 1 9 Best Language: 0 10 Dysarthria: 0 11 Extinct. and Inatten.: 0 TOTAL: 4  Labs I have reviewed labs in epic and the results pertinent to this consultation are: CBC    Component Value Date/Time   WBC 10.4 07/15/2020 0911   RBC 5.28 (H) 07/15/2020 0911   HGB 15.3 (H) 07/15/2020 0916   HCT 45.0 07/15/2020 0916   PLT 263 07/15/2020 0911   MCV 81.8 07/15/2020 0911   MCH 26.1 07/15/2020 0911   MCHC 31.9 07/15/2020 0911   RDW 14.8 07/15/2020 0911   LYMPHSABS 3.2 07/15/2020 0911   MONOABS 0.8 07/15/2020 0911   EOSABS 0.2 07/15/2020 0911   BASOSABS 0.1 07/15/2020 0911   CMP     Component Value Date/Time   NA 142 07/15/2020 0916   NA 141 02/19/2019 1110   K 3.3 (L) 07/15/2020 0916   CL 107 07/15/2020 0916   CO2 21 02/19/2019 1110   GLUCOSE 106 (H) 07/15/2020 0916   BUN 6 07/15/2020 0916   BUN 6 02/19/2019 1110   CREATININE 0.50 07/15/2020 0916   CALCIUM 9.5 02/19/2019 1110   PROT 7.8 06/26/2007 2201   ALBUMIN 4.5 06/26/2007 2201   AST 16 06/26/2007 2201   ALT 8 06/26/2007 2201   ALKPHOS 64  06/26/2007 2201   BILITOT 0.3 06/26/2007 2201   GFRNONAA 104 02/19/2019 1110   GFRAA 120 02/19/2019 1110   Lipid Panel  No results found for: CHOL, TRIG, HDL, CHOLHDL, VLDL, LDLCALC, LDLDIRECT No results found for: HGBA1C  Imaging I have reviewed the images obtained: CT-scan of the brain: IMPRESSION: 1. Negative CT head 2. ASPECTS is 10  CT angio head and neck: IMPRESSION: 1. Negative for large vessel occlusion. 2. Generalized arterial tortuosity but minimal atherosclerosis in the head and neck. No arterial stenosis.  Assessment: 50 year old female with history as above presents as a code stroke for evaluation of right lower extremity numbness, dizziness, blurred vision, and a diaphoretic episode at work at approximately 08:00 this morning. - Examination reveals anxious patient with subtle right mouth droop, right upper extremity weakness with vertical drift, and right lower extremity drift with numbness. Patient with hypertensive emergency on scene with blood pressure of 206/123 with dizziness and blurred vision; remained hypertensive on hospital arrival. - Initial imaging without acute intracranial abnormality,  no LVO, minimal atherosclerosis in head and neck, but with generalized arterial tortuosity.  - Concern for acute infarct, tPA offered with explanation of risks and benefits. Patient decided not to consent for tPA at this time.  - DDx includes hypertensive emergency versus cerebral hypoperfusion with ischemia / infarct. Further stroke work up pending.   Recommendations: - HgbA1c, fasting lipid panel - MRI of the brain without contrast - Frequent neuro checks - Echocardiogram - Permissive hypertension, treat blood pressure > 220 / > 120  - Prophylactic therapy-Antiplatelet med: Aspirin - dose 325mg  PO or 300mg  PR - Risk factor modification - Telemetry monitoring - PT consult, OT consult, Speech consult  Pt seen by NP/Neuro and later by MD. Note/plan to be edited by MD as  needed.  , AGAC-NP [facility] Pager: [phone]  I have seen the patient and reviewed the above note. She presented with right sided weakness and severe hypertension. Would recommend TIA/stroke workup as above.   Lanae Boast, MD [facility] [phone]  If 7pm- 7am, please page neurology on call as listed in AMION.

## 2020-07-15 NOTE — Progress Notes (Signed)
4 mg cleviprex at [phone]

## 2020-07-15 NOTE — ED Triage Notes (Signed)
Pt arrived from work by EMS for left sided weakness and trouble speaking   LNK was 0800, co-workers state she was at her baseline at the beginning of the shift.   Code stroke called

## 2020-07-15 NOTE — Progress Notes (Signed)
Pt declined TPA [id number], cleviprex turned off.

## 2020-07-15 NOTE — Code Documentation (Signed)
Pt is 50 yr old female with PMH of HTN who had a sudden onset of right sided weakness and sensory decrease at around 0840. Pt arrived via EMS at 0858. Cleared at bridge, Blood drawn, CBG 106. Pt showing drift in rt arm, rt leg, some mild facial droop on rt, as well as sensory decrease in rt leg. She is hypertensive with SBP 209. Pt taken to CT scanner at 0908. CTNC negative for hemorrhage per Dr Amada Jupiter. Order received to lower BP in case TPA was ordered. 10 mg labetelol given at 0914.  CTA obtained. SBP still elevated above 185 so cleviprex gtt initiated at 0921. See MAR for titration. Pt was offered TPA by Dr Amada Jupiter, and pt declined at [phone]. At that time cleviprex gtt was turned off. Pt returned to room at 0934 for further workup.TPA not given as pt refused, NIR not given as LVO negative. Pt will need q 2 hr VS and NIHSS. Bedside handoff with Adela Lank RN done.

## 2020-07-15 NOTE — ED Notes (Signed)
Pt transported to echo. Pt wants to leave the hospital. Education provided.

## 2020-07-15 NOTE — Evaluation (Signed)
Occupational Therapy Evaluation Patient Details Name: Leah Rose MRN: [id number] DOB: [birth year] Today's Date: 07/15/2020    History of Present Illness 50 y/o female with history of HTN, depression and chronic venous insufficiency who presented with acute onset right sided weakness, altered sensation, and blurry vision.  MRI: No evidence of acute intracranial abnormality   Clinical Impression   Patient admitted for the diagnosis above.  Eval limited to seated edge of bed due to administration of BP lowering med per RN.  PTA the patient lived alone in a motel room, she worked FT, and walked to [employer] near her hotel room.  She does not drive, but has friends that pick her up for work.  The patient states she can arrange for a friend to stay with her for an evening to ensure safety post discharge.  Currently, due to the barriers listed below, she is needing up to Min A for seated lower body ADL, and Mod I for basic bed mobility.  Out of bed to be assessed, and a more thorough determination of post acute needs can be assessed.  She is wanting to go home as soon as possible.      Follow Up Recommendations  No OT follow up;Supervision - Intermittent    Equipment Recommendations  None recommended by OT    Recommendations for Other Services       Precautions / Restrictions Precautions Precautions: Fall Precaution Comments: Watch BP Restrictions Weight Bearing Restrictions: No      Mobility Bed Mobility Overal bed mobility: Modified Independent                  Transfers                 General transfer comment: deferred due to BP lowering meds just given.    Balance Overall balance assessment: Needs assistance Sitting-balance support: No upper extremity supported;Feet supported Sitting balance-Leahy Scale: Good         Standing balance comment: NT                           ADL either performed or assessed with clinical  judgement   ADL Overall ADL's : Needs assistance/impaired Eating/Feeding: Independent;Bed level   Grooming: Wash/dry hands;Wash/dry face;Set up;Sitting Grooming Details (indicate cue type and reason): EOB         Upper Body Dressing : Set up;Sitting Upper Body Dressing Details (indicate cue type and reason): EOB Lower Body Dressing: Minimal assistance;Sitting/lateral leans Lower Body Dressing Details (indicate cue type and reason): EOB               General ADL Comments: Out of bed deferred by RN due to meds given for BP can cause dizziness - out of bed deferred for 30 min after med given.     Vision Baseline Vision/History: Wears glasses Wears Glasses: At all times Patient Visual Report: Blurring of vision Additional Comments: patient did not have her glasses.     Perception     Praxis      Pertinent Vitals/Pain Pain Assessment: Faces Faces Pain Scale: Hurts little more Pain Location: Headache Pain Descriptors / Indicators: Pressure Pain Intervention(s): Monitored during session;Premedicated before session     Hand Dominance Right   Extremity/Trunk Assessment Upper Extremity Assessment Upper Extremity Assessment: Generalized weakness   Lower Extremity Assessment Lower Extremity Assessment: RLE deficits/detail RLE Deficits / Details: Defer to PT, but RLE is weaker than the  L.  Patient presents with a catch and release pattern with MMT to R hip and knee extension.  Difficulty with dorsiflexion. RLE Sensation: decreased light touch;decreased proprioception RLE Coordination: decreased gross motor   Cervical / Trunk Assessment Cervical / Trunk Assessment: Normal   Communication Communication Communication: No difficulties   Cognition Arousal/Alertness: Awake/alert Behavior During Therapy: Flat affect Overall Cognitive Status: Within Functional Limits for tasks assessed                                 General Comments: Patient is emotially  liable and tearful during eval.  She states she is under a lot of stress, but does not elaborate.   General Comments                  Home Living Family/patient expects to be discharged to:: Private residence Living Arrangements: Alone Available Help at Discharge: Friend(s);Available PRN/intermittently Type of Home: Other(Comment) [facility] Room) Home Access: Level entry     Home Layout: One level     Bathroom Shower/Tub: Chief Strategy Officer: Standard Bathroom Accessibility: Yes How Accessible: Accessible via walker Home Equipment: None          Prior Functioning/Environment Level of Independence: Independent        Comments: Patient works FT, does not drive, walks to [employer] and to grocery store.  Patient needs no assist with ADL or IADL.        OT Problem List: Decreased strength;Impaired balance (sitting and/or standing);Impaired sensation;Pain      OT Treatment/Interventions: Self-care/ADL training;Therapeutic exercise;Balance training;Therapeutic activities    OT Goals(Current goals can be found in the care plan section) Acute Rehab OT Goals Patient Stated Goal: I want to go home OT Goal Formulation: With patient Time For Goal Achievement: 07/29/20 Potential to Achieve Goals: Good  OT Frequency: Min 2X/week   Barriers to D/C:    none noted       Co-evaluation              AM-PAC OT "6 Clicks" Daily Activity     Outcome Measure Help from another person eating meals?: None Help from another person taking care of personal grooming?: None Help from another person toileting, which includes using toliet, bedpan, or urinal?: A Little Help from another person bathing (including washing, rinsing, drying)?: A Little Help from another person to put on and taking off regular upper body clothing?: None Help from another person to put on and taking off regular lower body clothing?: A Little 6 Click Score: 21   End of Session     Activity Tolerance: Patient tolerated treatment well Patient left: in bed;with call bell/phone within reach (bed alarm not on upon entering the room)  OT Visit Diagnosis: Unsteadiness on feet (R26.81);Muscle weakness (generalized) (M62.81);Dizziness and giddiness (R42);Hemiplegia and hemiparesis Hemiplegia - Right/Left: Right Hemiplegia - dominant/non-dominant: Dominant Hemiplegia - caused by: Unspecified                Time: 1711-1730 OT Time Calculation (min): 19 min Charges:  OT General Charges $OT Visit: 1 Visit OT Evaluation $OT Eval Moderate Complexity: 1 Mod  07/15/2020  Rich, OTR/L  Acute Rehabilitation Services  Office:  [phone]   Suzanna Obey 07/15/2020, 5:46 PM

## 2020-07-15 NOTE — ED Provider Notes (Signed)
[facility] EMERGENCY DEPARTMENT Provider Note   CSN: [id number] Arrival date & time: 07/15/20  6283  An emergency department physician performed an initial assessment on this suspected stroke patient at 0908.  History Chief Complaint  Patient presents with  . Code Stroke    Leah Rose is a 50 y.o. female.  The history is provided by the patient and medical records. No language interpreter was used.  Neurologic Problem This is a new problem. The current episode started 1 to 2 hours ago. The problem occurs constantly. The problem has been rapidly improving. Pertinent negatives include no chest pain, no abdominal pain, no headaches and no shortness of breath. Nothing aggravates the symptoms. Nothing relieves the symptoms. She has tried nothing for the symptoms. The treatment provided no relief.       Past Medical History:  Diagnosis Date  . Chronic venous insufficiency 06/23/2017  . Essential hypertension 03/14/2006  . 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 Tobacco abuse 03/14/2006    Patient Active Problem List   Diagnosis Date Noted  . Chronic venous insufficiency 06/23/2017  . Healthcare maintenance 08/12/2016  . Tobacco abuse 03/14/2006  . 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 Essential hypertension 03/14/2006    History reviewed. No pertinent surgical history.   OB History   No obstetric history on file.     Family History  Problem Relation Age of Onset  . Seizures Mother   . Cirrhosis Mother   . Birth defects Sister   . Hypertension Sister   . Breast cancer Maternal Grandmother   . Thyroid disease Sister     Social History   Tobacco Use  . Smoking status: Current Every Day Smoker    Packs/day: 1.00  . Smokeless tobacco: Never Used  . Tobacco comment: less sometimes.  Substance Use Topics  . Alcohol use: Yes    Alcohol/week: 14.0 standard drinks    Types: 14 Cans of beer per week  . Drug  use: No    Home Medications Prior to Admission medications   Medication Sig Start Date End Date Taking? Authorizing Provider  lisinopril (ZESTRIL) 40 MG tablet Take 1 tablet by mouth once daily 07/23/19   Earl Lagos, MD  sertraline (ZOLOFT) 50 MG tablet Take 1 tablet (50 mg total) by mouth daily. 02/19/19 02/19/20  Earl Lagos, MD    Allergies    Penicillins and Sulfonamide derivatives  Review of Systems   Review of Systems  Constitutional: Positive for diaphoresis. Negative for chills, fatigue and fever.  HENT: Negative for congestion.   Eyes: Negative for visual disturbance.  Respiratory: Negative for cough, chest tightness, shortness of breath and wheezing.   Cardiovascular: Negative for chest pain.  Gastrointestinal: Negative for abdominal pain, diarrhea and vomiting.  Musculoskeletal: Negative for back pain, neck pain and neck stiffness.  Neurological: Positive for weakness, light-headedness and numbness. Negative for dizziness and headaches.  Psychiatric/Behavioral: Negative for agitation and confusion.  All other systems reviewed and are negative.   Physical Exam Updated Vital Signs BP (!) 188/112   Pulse 80   Temp 98.3 F (36.8 C) (Oral)   Resp (!) 26   Ht 4\' 11"  (1.499 m)   Wt 49.4 kg   SpO2 100%   BMI 22.00 kg/m   Physical Exam Vitals and nursing note reviewed.  Constitutional:      General: She is not in acute distress.    Appearance: She is well-developed. She is not ill-appearing, toxic-appearing or diaphoretic.  HENT:     Rose: Normocephalic and atraumatic.     Nose: No congestion or rhinorrhea.     Mouth/Throat:     Mouth: Mucous membranes are moist.     Pharynx: No oropharyngeal exudate or posterior oropharyngeal erythema.  Eyes:     General: No visual field deficit.    Extraocular Movements: Extraocular movements intact.     Conjunctiva/sclera: Conjunctivae normal.     Pupils: Pupils are equal, round, and reactive to light.   Cardiovascular:     Rate and Rhythm: Normal rate and regular rhythm.     Pulses: Normal pulses.     Heart sounds: No murmur heard.   Pulmonary:     Effort: Pulmonary effort is normal. No respiratory distress.     Breath sounds: Normal breath sounds. No wheezing, rhonchi or rales.  Chest:     Chest wall: No tenderness.  Abdominal:     Palpations: Abdomen is soft.     Tenderness: There is no abdominal tenderness.  Musculoskeletal:        General: No tenderness.     Cervical back: Neck supple. No tenderness.     Right lower leg: No edema.     Left lower leg: No edema.  Skin:    General: Skin is warm and dry.     Capillary Refill: Capillary refill takes less than 2 seconds.  Neurological:     Mental Status: She is alert.     Cranial Nerves: Facial asymmetry present. No dysarthria.     Sensory: Sensory deficit present.     Motor: No abnormal muscle tone.     Coordination: Finger-Nose-Finger Test normal.     Comments: Slight right facial droop  Psychiatric:        Mood and Affect: Mood normal.     ED Results / Procedures / Treatments   Labs (all labs ordered are listed, but only abnormal results are displayed) Labs Reviewed  CBC - Abnormal; Notable for the following components:      Result Value   RBC 5.28 (*)    All other components within normal limits  COMPREHENSIVE METABOLIC PANEL - Abnormal; Notable for the following components:   Potassium 3.4 (*)    CO2 20 (*)    Glucose, Bld 108 (*)    BUN 5 (*)    All other components within normal limits  I-STAT CHEM 8, ED - Abnormal; Notable for the following components:   Potassium 3.3 (*)    Glucose, Bld 106 (*)    Hemoglobin 15.3 (*)    All other components within normal limits  CBG MONITORING, ED - Abnormal; Notable for the following components:   Glucose-Capillary 106 (*)    All other components within normal limits  RESP PANEL BY RT-PCR (FLU A&B, COVID) ARPGX2  PROTIME-INR  APTT  DIFFERENTIAL  HEMOGLOBIN A1C   LIPID PANEL  HIV ANTIBODY (ROUTINE TESTING W REFLEX)  I-STAT BETA HCG BLOOD, ED ([facility], [facility], [facility] ONLY)    EKG EKG Interpretation  Date/Time:  Wednesday July 15 2020 09:40:36 EDT Ventricular Rate:  100 PR Interval:  144 QRS Duration: 81 QT Interval:  369 QTC Calculation: 476 R Axis:   25 Text Interpretation: Sinus tachycardia Probable left atrial enlargement Abnormal R-wave progression, early transition When compared to prior, slightly faster rate. No STEMI Confirmed by Theda Belfastegeler, Chris ([id number]) on 07/15/2020 10:37:54 AM   Radiology CT Rose CODE STROKE WO CONTRAST  Result Date: 07/15/2020 CLINICAL DATA:  Code stroke. Right-sided weakness. Acute  neuro deficit EXAM: CT Rose WITHOUT CONTRAST TECHNIQUE: Contiguous axial images were obtained from the base of the skull through the vertex without intravenous contrast. COMPARISON:  CT Rose 12/12/2014 FINDINGS: Brain: No evidence of acute infarction, hemorrhage, hydrocephalus, extra-axial collection or mass lesion/mass effect. Vascular: Negative for hyperdense vessel Skull: Negative Sinuses/Orbits: Negative Other: None ASPECTS (Alberta Stroke Program Early CT Score) - Ganglionic level infarction (caudate, lentiform nuclei, internal capsule, insula, M1-M3 cortex): 7 - Supraganglionic infarction (M4-M6 cortex): 3 Total score (0-10 with 10 being normal): 10 IMPRESSION: 1. Negative CT Rose 2. ASPECTS is 10 3. Code stroke imaging results were communicated on 07/15/2020 at 9:19 am to provider Amada Jupiter via text page Electronically Signed   By: Marlan Palau M.D.   On: 07/15/2020 09:20   CT ANGIO Rose NECK W WO CM (CODE STROKE)  Result Date: 07/15/2020 CLINICAL DATA:  50 year old female with code stroke presentation, right side symptoms. EXAM: CT ANGIOGRAPHY Rose AND NECK TECHNIQUE: Multidetector CT imaging of the Rose and neck was performed using the standard protocol during bolus administration of intravenous contrast. Multiplanar CT image reconstructions and  MIPs were obtained to evaluate the vascular anatomy. Carotid stenosis measurements (when applicable) are obtained utilizing NASCET criteria, using the distal internal carotid diameter as the denominator. CONTRAST:  75mL OMNIPAQUE IOHEXOL 350 MG/ML SOLN COMPARISON:  Plain Rose CT 0914 hours today. FINDINGS: CTA NECK Skeleton: Occasional carious dentition. Reversal of cervical lordosis. No acute osseous abnormality identified. Upper chest: Negative. Other neck: Negative. Aortic arch: Bovine type arch configuration. No arch atherosclerosis. Right carotid system: Normal brachiocephalic artery and right CCA origin without plaque or stenosis. Minimal soft plaque at the posterior right ICA origin without stenosis. Normal right ICA bulb. Tortuous but otherwise negative distal cervical right ICA. Left carotid system: Bovine left CCA origin without plaque or stenosis. Negative left carotid bifurcation and cervical left ICA. Vertebral arteries: Normal proximal right subclavian artery and right vertebral artery origin. Right vertebral artery is normal aside from mild tortuosity to the skull base. Minimal soft plaque in the proximal left subclavian artery without stenosis. Normal left vertebral artery origin. Codominant left vertebral artery with mild tortuosity is normal to the skull base. CTA Rose Posterior circulation: Mildly ectatic distal vertebral arteries and basilar without plaque or stenosis. Normal PICA origins, vertebrobasilar junction, SCA and PCA origins. Tortuous basilar tip. Posterior communicating arteries are diminutive or absent. Bilateral PCA branches are normal aside from mild tortuosity. Anterior circulation: Both ICA siphons are patent with mild tortuosity and minimal plaque. No siphon stenosis. Normal ophthalmic artery origins. Patent carotid termini with normal MCA and ACA origins. Anterior communicating artery and bilateral ACA branches are normal aside from tortuosity. Left MCA M1 segment, trifurcation  and left MCA branches are within normal limits. Right MCA M1 segment and trifurcation are patent without stenosis. Right MCA branches are within normal limits. Venous sinuses: Patent. Anatomic variants: None. Review of the MIP images confirms the above findings IMPRESSION: 1. Negative for large vessel occlusion. 2. Generalized arterial tortuosity but minimal atherosclerosis in the Rose and neck. No arterial stenosis. These results were communicated to Dr. Amada Jupiter at 9:42 amon 4/13/2022by text page via the [facility] messaging system. Electronically Signed   By: Odessa Fleming M.D.   On: 07/15/2020 09:43    Procedures Procedures   Medications Ordered in ED Medications  clevidipine (CLEVIPREX) infusion 0.5 mg/mL (0 mg/hr Intravenous Stopped 07/15/20 0926)   stroke: mapping our early stages of recovery book (has no administration in time range)  acetaminophen (TYLENOL) tablet 650 mg (650 mg Oral Given 07/15/20 1441)    Or  acetaminophen (TYLENOL) 160 MG/5ML solution 650 mg ( Per Tube See Alternative 07/15/20 1441)    Or  acetaminophen (TYLENOL) suppository 650 mg ( Rectal See Alternative 07/15/20 1441)  senna-docusate (Senokot-S) tablet 1 tablet (has no administration in time range)  enoxaparin (LOVENOX) injection 40 mg (has no administration in time range)  aspirin suppository 300 mg (has no administration in time range)    Or  aspirin tablet 325 mg (has no administration in time range)  lisinopril (ZESTRIL) tablet 40 mg (has no administration in time range)  labetalol (NORMODYNE) injection 10 mg (has no administration in time range)  sodium chloride flush (NS) 0.9 % injection 3 mL (3 mLs Intravenous Given 07/15/20 0915)  iohexol (OMNIPAQUE) 350 MG/ML injection 75 mL (75 mLs Intravenous Contrast Given 07/15/20 0923)  labetalol (NORMODYNE) injection 10 mg (10 mg Intravenous Given 07/15/20 0914)    ED Course  I have reviewed the triage vital signs and the nursing notes.  Pertinent labs & imaging results  that were available during my care of the patient were reviewed by me and considered in my medical decision making (see chart for details).    MDM Rules/Calculators/A&P                          Leah Rose is a 50 y.o. female with a past medical history significant for hypertension, depression, chronic venous insufficiency who presents as a code stroke for sudden onset of blurry vision and right-sided numbness and weakness.  According patient and report, patient was at work today when she started having right leg numbness, dizziness/lightheadedness, some blurry vision, and diaphoresis.  Patient was brought in by EMS where she was found to have a subtle right facial droop, right arm numbness/weakness, and right leg numbness/weakness, difficulty with ambulation.  Patient quickly taken to CT scanner where she was seen by neurology as well.    On my evaluation, patient is still reporting some numbness in her right leg compared to left.  She also very slight weakness in the right leg compared to left.  She did not have any numbness or is in her right arm compared to left on my exam and she thinks symptoms are starting to improve.  She had a very subtle right facial droop but no numbness appreciated.  Normal extraocular movements and pupil exam.  She reports the blurry vision has resolved.  She denies any headache or neck pain.  Lungs clear chest nontender.  Abdomen nontender.  Patient now resting comfortably.  Normal finger-nose-finger testing bilaterally.  Gait deferred initially.  After CTs did not show LVO clot or bleed, patient was offered TPA by neurology.  Patient does not want TPA.  Neurology recommends admission to medicine for either hypertensive emergency causing the symptoms versus acute stroke.  They recommend getting MRI without contrast and allow permissive hypertension with a goal blood pressure less than 220/120.  Patient was given some labetalol initially and blood pressure is now in the  180s.  Patient will need further work-up for likely stroke versus hypertensive emergency.  Medicine will be called for admission although I am encouraged that patient symptoms appear to be improving on her report.    Final Clinical Impression(s) / ED Diagnoses Final diagnoses:  Numbness  Elevated blood pressure reading    Clinical Impression: 1. Numbness   2. Elevated blood pressure reading  Disposition: Admit  This note was prepared with assistance of Conservation officer, historic buildings. Occasional wrong-word or sound-a-like substitutions may have occurred due to the inherent limitations of voice recognition software.     Velecia Ovitt, Canary Brim, MD 07/15/20 [phone]

## 2020-07-15 NOTE — H&P (Signed)
Date: 07/15/2020               Leah Rose MRN: [id number]  DOB: Sep 12, 1970 Age / Sex: 49 y.o., female   PCP: Leah Lagos, MD         Medical Service: Internal Medicine Teaching Service         Attending Physician: Dr. Gust Rung, DO    First Contact: Dr. Claudette Laws Pager: [phone]  Second Contact: Dr. Ephriam Knuckles Pager: [phone]       After Hours (After 5p/  First Contact Pager: [phone]  weekends / holidays): Second Contact Pager: [phone]   Chief Complaint: right sided weakness   History of Present Illness: Leah Rose is a 50 y/o female with history of HTN, depression and chronic venous insufficiency who presented with acute onset right sided weakness, altered sensation, and blurry vision. She was in her usual state of health when she woke up this morning. She went to her normal work shift at 7 am. Approximately an hour in she started feeling "off" like she sometimes will when her blood pressure is high. She noticed headache and lightheadedness. She subsequently developed right sided "heaviness." EMS was called, and she was brought to the ED as a code stroke.   Providers noted her to have subtle right facial droop and right sided numbness/weakness. She was taken to CT which ruled out hemorrhage and LVO. She was offered TPA by neurology, but Leah declined.   She denies syncope, fevers, chills, URI symptoms, chest pain, shortness of breath, palpitations, n/v, abdominal pain, changes in BMs, urinary symptoms. No history of similar symptoms.   Meds:  Current Meds  Medication Sig  . lisinopril (ZESTRIL) 40 MG tablet Take 1 tablet by mouth once daily  . medroxyPROGESTERone (DEPO-PROVERA) 150 MG/ML injection Inject 1 mL into the muscle every 3 (three) months.     Allergies: Allergies as of 07/15/2020 - Review Complete 07/15/2020  Allergen Reaction Noted  . Penicillins Itching   . Sulfonamide derivatives Itching and Rash    Past Medical History:   Diagnosis Date  . Chronic venous insufficiency 06/23/2017  . Essential hypertension 03/14/2006  . 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 Tobacco abuse 03/14/2006    Family History:  Family History  Problem Relation Age of Onset  . Seizures Mother   . Cirrhosis Mother   . Birth defects Sister   . Hypertension Sister   . Breast cancer Maternal Grandmother   . Thyroid disease Sister      Social History: Leah is a daily smoker, states a pack lasts her a week. No EtOH or illicit drug use   Review of Systems: A complete ROS was negative except as per HPI.   Physical Exam: Blood pressure (!) 144/119, pulse 84, temperature 98.3 F (36.8 C), temperature source Oral, resp. rate (!) 27, height 4\' 11"  (1.499 m), weight 49.4 kg, SpO2 100 %. General: awake, alert, lying in bed in NAD HEENT: [place]/AT. PEERL. EOMI. Moist mucous membranes Neck: supple; no thyromegaly CV: RRR; no m/r/g Pulm: normal work of breathing on room air; lungs CTAB Abd: BS+; abdomen soft, non-distended, non-tender Ext: warm and well-perfused; no edema Neuro: A&Ox4. Cranial nerves intact. Motor exam reveals 4/5 in RLE, 5/5 in other extremities. Sensory exam notable for decreased/altered sensation in RLE, but otherwise normal. No pronator drift. Cerebellar testing normal.  Skin: warm and dry; no rashes or lesions  EKG: personally reviewed my interpretation is sinus tachycardia, no acute  ischemic changes   Assessment & Plan by Problem: Active Problems:   Hypertensive emergency   Leah Rose is a 50 y/o female with history of HTN, depression and chronic venous insufficiency who presented with acute onset right sided weakness, altered sensation, and blurry vision. She was initially managed as a code stroke. CT head negative for LVO or hemorrhage. Given the significant and acuity of her symptoms, she was offered TPA which she declined. Neurology recommended admission for further stroke work-up vs  management of hypertensive emergency.   Hypertensive emergency -presented with acute neurologic findings concerning for CVA vs TIA with fairly rapid improvement of her symptoms -MRI was negative, indicating her presentation was due to hypertensive emergency -appreciate neurology consult and recommendations -BP has significantly improved since admission, got a dose of labetalol in the ED. Do not see indication for infusion given her symptom improvement and she is now normotensive.  -will resume her home Lisinopril, can add amlodipine or thiazide diuretic if extra BP control is needed  -prn labetalol for BP >160/90  Dispo: Admit Leah to Observation with expected length of stay less than 2 midnights.  SignedBridget Hartshorn, DO 07/15/2020, 2:33 PM  Pager: [phone] After 5pm on weekdays and 1pm on weekends: On Call pager: [phone]

## 2020-07-16 ENCOUNTER — Other Ambulatory Visit (HOSPITAL_COMMUNITY): Payer: Self-pay

## 2020-07-16 DIAGNOSIS — E782 Mixed hyperlipidemia: Secondary | ICD-10-CM | POA: Diagnosis not present

## 2020-07-16 DIAGNOSIS — G43109 Migraine with aura, not intractable, without status migrainosus: Secondary | ICD-10-CM

## 2020-07-16 DIAGNOSIS — I517 Cardiomegaly: Secondary | ICD-10-CM | POA: Diagnosis present

## 2020-07-16 DIAGNOSIS — I161 Hypertensive emergency: Secondary | ICD-10-CM | POA: Diagnosis not present

## 2020-07-16 LAB — BASIC METABOLIC PANEL
Anion gap: 8 (ref 5–15)
BUN: 6 mg/dL (ref 6–20)
CO2: 23 mmol/L (ref 22–32)
Calcium: 9.2 mg/dL (ref 8.9–10.3)
Chloride: 107 mmol/L (ref 98–111)
Creatinine, Ser: 0.63 mg/dL (ref 0.44–1.00)
GFR, Estimated: >60 mL/min (ref >60)
Glucose, Bld: 107 mg/dL — ABNORMAL HIGH (ref 70–99)
Potassium: 3.7 mmol/L (ref 3.5–5.1)
Sodium: 138 mmol/L (ref 135–145)

## 2020-07-16 LAB — RAPID URINE DRUG SCREEN, HOSP PERFORMED
Amphetamines: NOT DETECTED
Barbiturates: NOT DETECTED
Benzodiazepines: NOT DETECTED
Cocaine: NOT DETECTED
Opiates: NOT DETECTED
Tetrahydrocannabinol: NOT DETECTED

## 2020-07-16 MED ORDER — ASPIRIN EC 325 MG PO TBEC
325.0000 mg | DELAYED_RELEASE_TABLET | Freq: Every day | ORAL | 0 refills | Status: DC
  Filled 2020-07-16: qty 30, 30d supply, fill #0

## 2020-07-16 MED ORDER — ASPIRIN 81 MG PO TBEC
81.0000 mg | DELAYED_RELEASE_TABLET | Freq: Every day | ORAL | 2 refills | Status: DC
  Filled 2020-07-16: qty 90, 90d supply, fill #0

## 2020-07-16 MED ORDER — ROSUVASTATIN CALCIUM 5 MG PO TABS
10.0000 mg | ORAL_TABLET | Freq: Every day | ORAL | Status: DC
  Administered 2020-07-16: 10 mg via ORAL
  Filled 2020-07-16: qty 2

## 2020-07-16 MED ORDER — CLOPIDOGREL BISULFATE 75 MG PO TABS
75.0000 mg | ORAL_TABLET | Freq: Every day | ORAL | 0 refills | Status: AC
  Filled 2020-07-16: qty 21, 21d supply, fill #0

## 2020-07-16 MED ORDER — LISINOPRIL 20 MG PO TABS
40.0000 mg | ORAL_TABLET | Freq: Every day | ORAL | Status: DC
  Filled 2020-07-16: qty 2

## 2020-07-16 MED ORDER — ROSUVASTATIN CALCIUM 20 MG PO TABS: 20.0000 mg | ORAL_TABLET | Freq: Every day | ORAL | Status: DC

## 2020-07-16 MED ORDER — HYDROCHLOROTHIAZIDE 12.5 MG PO CAPS
12.5000 mg | ORAL_CAPSULE | Freq: Every day | ORAL | Status: DC
  Administered 2020-07-16: 12.5 mg via ORAL
  Filled 2020-07-16: qty 1

## 2020-07-16 MED ORDER — HYDROCHLOROTHIAZIDE 12.5 MG PO TABS
12.5000 mg | ORAL_TABLET | Freq: Every day | ORAL | 0 refills | Status: DC
  Filled 2020-07-16: qty 30, 30d supply, fill #0

## 2020-07-16 MED ORDER — OLMESARTAN MEDOXOMIL 20 MG PO TABS
20.0000 mg | ORAL_TABLET | Freq: Every day | ORAL | 0 refills | Status: DC
  Filled 2020-07-16: qty 30, 30d supply, fill #0

## 2020-07-16 MED ORDER — IRBESARTAN 150 MG PO TABS
150.0000 mg | ORAL_TABLET | Freq: Every day | ORAL | Status: DC
  Administered 2020-07-16: 150 mg via ORAL
  Filled 2020-07-16: qty 1

## 2020-07-16 MED ORDER — IBUPROFEN 200 MG PO TABS
400.0000 mg | ORAL_TABLET | Freq: Once | ORAL | Status: AC
  Administered 2020-07-16: 400 mg via ORAL
  Filled 2020-07-16: qty 2

## 2020-07-16 MED ORDER — ROSUVASTATIN CALCIUM 20 MG PO TABS
20.0000 mg | ORAL_TABLET | Freq: Every day | ORAL | 0 refills | Status: DC
  Filled 2020-07-16: qty 30, 30d supply, fill #0

## 2020-07-16 MED ORDER — ROSUVASTATIN CALCIUM 10 MG PO TABS
10.0000 mg | ORAL_TABLET | Freq: Every day | ORAL | 0 refills | Status: DC
  Filled 2020-07-16: qty 30, 30d supply, fill #0

## 2020-07-16 NOTE — Evaluation (Signed)
Physical Therapy Evaluation Patient Details Name: Leah Rose MRN: [id number] DOB: September 04, 1970 Today's Date: 07/16/2020   History of Present Illness  50 y/o female presented to ED on 4/13 with acute onset R sided weakness, altered sensation, and blurry vision. CT and MRI negative for acute abnormalities. PMH: HTN, depression, tobacco use, chronic venous insufficiency.  Clinical Impression  PTA, patient lives alone in [place] and reports independence. Patient ambulated to bus stop and would not state distance. Patient utilizes public transportation and friends for transportation needs. Patient presents with generalized weakness, impaired balance, and decreased activity tolerance. Patient requires min guard for 100' ambulation with no AD. Reports of fatigue and weakness throughout. BP assessed prior to activity 141/91 and after activity 151/101. Patient will benefit from skilled PT services during acute stay to address listed deficits. Recommend OPPT following discharge to address higher level balance.     Follow Up Recommendations Outpatient PT    Equipment Recommendations  None recommended by PT    Recommendations for Other Services       Precautions / Restrictions Precautions Precautions: Fall Precaution Comments: Watch BP Restrictions Weight Bearing Restrictions: No      Mobility  Bed Mobility Overal bed mobility: Modified Independent                  Transfers Overall transfer level: Needs assistance Equipment used: None Transfers: Sit to/from Stand Sit to Stand: Supervision         General transfer comment: supervision for safety  Ambulation/Gait Ambulation/Gait assistance: Min guard Gait Distance (Feet): 100 Feet Assistive device: None Gait Pattern/deviations: Step-through pattern;Decreased stride length;Narrow base of support Gait velocity: decreased   General Gait Details: slow guarded gait. Min guard for safety. Patient reports fatigue and weakness  during ambulation.  Stairs            Wheelchair Mobility    Modified Rankin (Stroke Patients Only)       Balance Overall balance assessment: Mild deficits observed, not formally tested                                           Pertinent Vitals/Pain Pain Assessment: Faces Faces Pain Scale: Hurts little more Pain Location: Headache Pain Descriptors / Indicators: Pressure Pain Intervention(s): Monitored during session;Repositioned    Home Living Family/patient expects to be discharged to:: Private residence Living Arrangements: Alone Available Help at Discharge: Friend(s);Available PRN/intermittently Type of Home: Other(Comment) (hotel room) Home Access: Level entry     Home Layout: One level Home Equipment: None      Prior Function Level of Independence: Independent         Comments: Patient works FT, does not drive, walks to [employer] and to grocery store.  Patient needs no assist with ADL or IADL.     Hand Dominance   Dominant Hand: Right    Extremity/Trunk Assessment   Upper Extremity Assessment Upper Extremity Assessment: Defer to OT evaluation    Lower Extremity Assessment Lower Extremity Assessment: RLE deficits/detail RLE Deficits / Details: R weaker than L. MMT grossly 4/5 RLE Sensation: decreased light touch;decreased proprioception RLE Coordination: decreased gross motor    Cervical / Trunk Assessment Cervical / Trunk Assessment: Normal  Communication   Communication: No difficulties  Cognition Arousal/Alertness: Awake/alert Behavior During Therapy: Flat affect Overall Cognitive Status: Within Functional Limits for tasks assessed  General Comments      Exercises     Assessment/Plan    PT Assessment Patient needs continued PT services  PT Problem List Decreased strength;Decreased activity tolerance;Decreased balance;Decreased mobility;Impaired  sensation       PT Treatment Interventions DME instruction;Gait training;Stair training;Functional mobility training;Therapeutic activities;Therapeutic exercise;Balance training;Patient/family education    PT Goals (Current goals can be found in the Care Plan section)  Acute Rehab PT Goals Patient Stated Goal: I want to go home PT Goal Formulation: With patient Time For Goal Achievement: 07/30/20 Potential to Achieve Goals: Good    Frequency Min 3X/week   Barriers to discharge        Co-evaluation               AM-PAC PT "6 Clicks" Mobility  Outcome Measure Help needed turning from your back to your side while in a flat bed without using bedrails?: None Help needed moving from lying on your back to sitting on the side of a flat bed without using bedrails?: None Help needed moving to and from a bed to a chair (including a wheelchair)?: A Little Help needed standing up from a chair using your arms (e.g., wheelchair or bedside chair)?: A Little Help needed to walk in hospital room?: A Little Help needed climbing 3-5 steps with a railing? : A Little 6 Click Score: 20    End of Session Equipment Utilized During Treatment: Gait belt Activity Tolerance: Patient tolerated treatment well Patient left: in bed;with call bell/phone within reach;with nursing/sitter in room Nurse Communication: Mobility status PT Visit Diagnosis: Unsteadiness on feet (R26.81);Muscle weakness (generalized) (M62.81)    Time: 0923-3007 PT Time Calculation (min) (ACUTE ONLY): 18 min   Charges:   PT Evaluation $PT Eval Low Complexity: 1 Low          Darlene Brozowski A. Dan Humphreys PT, DPT Acute Rehabilitation Services Pager [phone] Office [phone]   Viviann Spare 07/16/2020, 12:20 PM

## 2020-07-16 NOTE — Progress Notes (Signed)
Patient explains "sinus-like pressure" to the front of her head. She says this is not a new feeling, but she also explains it seems to have shifted to the left side of her head. She comments that "I first felt it on the right side of my head".  Night MD has been notified.   07/16/20 0632  Vitals  Temp 98.5 F (36.9 C)  Temp Source Oral  BP (!) 144/98  MAP (mmHg) 111  BP Location Left Arm  BP Method Automatic  Patient Position (if appropriate) Lying  Pulse Rate 85  Pulse Rate Source Dinamap  Resp 16

## 2020-07-16 NOTE — Progress Notes (Addendum)
STROKE TEAM PROGRESS NOTE   INTERVAL HISTORY BP mostly 140s with highest reading 160s. MRI negative for stroke.   She is anxious during our visit. 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  Recalls history of headaches since her teens. She describes them as occurring at least 4 times a month with variable pain levels. Most of the time she pushes through them but sometimes they are severe enough to cause her to stop her activities. She has noticed blurred vision at times but not N/V or photosensitivity. She does not usually take any medications.  She reports resolution of her aphasia, except right hand feeling "just a bit" weak with tingling. She denies speech difficulty today. Sister has noticed no speech difficulty today.  She agrees to consider quitting smoking. She endorses compliance with lisinopril prior to admission. She agrees to take her BP daily and record in a log to take to primary care visit in 1-2 weeks. She has her home BP cuff with her and will check it against next BP taken here.   Her work up, diagnosis and stroke risk factors were discussed. Questions were addressed. Her sister is at the bedside. Her sister who is a NP is on speaker phone for part of the conversation. They are both strongly encouraging her to take better care of herself and change her lifestyle.   Vitals:   07/16/20 0056 07/16/20 0422 07/16/20 0632 07/16/20 0731  BP: (!) 144/88 (!) 162/99 (!) 144/98 133/84  Pulse: 72 79 85 74  Resp: 16 17 16 18   Temp: 98.8 F (37.1 C) 98.8 F (37.1 C) 98.5 F (36.9 C) 98.6 F (37 C)  TempSrc:  Oral Oral Oral  SpO2: 100% 100% 100% 100%  Weight:      Height:       CBC:  Recent Labs  Lab 07/15/20 0911 07/15/20 0916  WBC 10.4  --   NEUTROABS 6.1  --   HGB 13.8 15.3*  HCT 43.2 45.0  MCV 81.8  --   PLT 263  --    Basic Metabolic Panel:  Recent Labs  Lab 07/15/20 0911 07/15/20 0916 07/16/20 0720  NA 137  142 138  K 3.4* 3.3* 3.7  CL 107 107 107  CO2 20*  --  23  GLUCOSE 108* 106* 107*  BUN 5* 6 6  CREATININE 0.67 0.50 0.63  CALCIUM 9.2  --  9.2   Lipid Panel:  Recent Labs  Lab 07/15/20 1633  CHOL 208*  TRIG 63  HDL 56  CHOLHDL 3.7  VLDL 13  LDLCALC 07/17/20*   HgbA1c:  Recent Labs  Lab 07/15/20 1633  HGBA1C 6.7*   Urine Drug Screen: No results for input(s): LABOPIA, COCAINSCRNUR, LABBENZ, AMPHETMU, THCU, LABBARB in the last 168 hours.  Alcohol Level No results for input(s): ETH in the last 168 hours.  IMAGING/Pertinent studies MRI: No evidence of acute intracranial abnormality. Mild scattered T2/FLAIR hyperintensity within the cerebral white matter which is nonspecific, but most often secondary to chronic small vessel ischemia.  CTA head and neck: 1. Negative for large vessel occlusion. 2. Generalized arterial tortuosity but minimal atherosclerosis in the head and neck. No arterial stenosis  CTH code stroke: 1. Negative CT head 2. ASPECTS is 10  2D Echo: 1. Left ventricular ejection fraction, by estimation, is 60 to 65%. The  left ventricle has normal function. The left ventricle has no regional  wall motion abnormalities.  There is mild concentric left ventricular  hypertrophy. Left ventricular diastolic parameters were normal.  2. Right ventricular systolic function is normal. The right ventricular  size is normal. There is normal pulmonary artery systolic pressure.  3. The mitral valve is normal in structure. Trivial mitral valve  regurgitation. No evidence of mitral stenosis.  4. The aortic valve is tricuspid. Aortic valve regurgitation is not  visualized. No aortic stenosis is present.  5. The inferior vena cava is normal in size with greater than 50%  respiratory variability, suggesting right atrial pressure of 3 mmHg.  6. Agitated saline contrast bubble study was negative, with no evidence  of any interatrial shunt.    PHYSICAL EXAM GENERAL:  Sitting up cross legged in bed talking to sister.  Mildly anxious.  Head: Normocephalic and atraumatic, dry mm LUNGS - Normal respiratory effort. Non-labored breathing CV: Regular rate and rhythm on cardiac monitor Ext: warm, without edema  NEURO:  Mental Status:Anxious, A&Ox4. Speaking clearly and fluently.  Cranial Nerves:  II: PERRL. Visual fields full.  III, IV, VI: EOMI. Lid elevation symmetric and full.  V: Sensation is intact to light touch and symmetrical to face.   VII: Face is symmetric  VIII: Hearing is intact to voice IX, X: Palate elevation is symmetric. Hypophonic.   XI: Normal sternocleidomastoid and trapezius muscle strength XII: Tongue protrudes midline. Motor: 5/5 symmetric strength BUE and BLE  Tone is normal. Bulk is normal.  Sensation: SILT and symmetric to light touch x 4 extremities. +tingling sensation in R hand wrist to fingertips Coordination: FTN intact bilaterally. HKS intact bilaterally.  DTRs: 2+ throughout.  Gait-deferred   ASSESSMENT/PLAN Leah Rose is a 50 y.o. female with a medical history significant for essential hypertension, chronic venous insufficiency, moderate recurrent major depression, and tobacco use who presented to [facility] via EMS after experiencing a sudden onset of diaphoresis, dizziness, right lower extremity numbness, and blurred vision while at work. Severe HTN of 206/123 noted and was activated as a stroke alert via EMS. On arrival, she was found to have a subtle right mouth droop, right arm weakness/heaviness, right leg weakness and numbness, and an unstable gait. NIHSS 4. No acute stroke found on work up.   TIA vs. Migraine equivalent vs. Hypertensive crisis in the setting of multiple stroke risk factors including uncontrolled hypertension, migraine, and smoking.   VTE prophylaxis - Lovenox 40mg      Diet   Diet Heart Room service appropriate? Yes; Fluid consistency: Thin     Plavix 75mg  and ASA 81 mg x 3 weeks followed by  ASA 81mg  alone.   Therapy recommendations:  Cleared for home   Disposition:  Home   Follow up in 4 weeks with [facility]. Appt requested.   Hypertension  Home meds: Lisinopril 40mg    Stable on hctz and avapro . BP goal normotensive . Management per primary team   Hyperlipidemia  Home meds:  None  LDL 139, not at goal < 70  On Crestor 20mg  currently  Continue statin at discharge  Glucose Management  No hx of DM  Home meds:  N/A  HgbA1c 6.7, goal < 7.0  CBGs  Tobacco abuse  Current smoker  Smoking cessation counseling provided  Pt is willing to quit  Other Stroke Risk Factors  Migraines  Other Active Problems    Hospital day # 1 This plan of care was directed by Dr. .  , NP-C  50 year old female with history of uncontrolled hypertension, smoker, depression, migraine admitted  for hypertension, episode of dizziness, right lower extremity numbness, diaphoresis and blurry vision.  CT no acute abnormality, CTA head and neck unremarkable, MRI no acute infarct.  EF 60 to 65%.  A1c 6.7 LDL 139 and UDS negative.  Patient currently symptoms resolved, still felt right lower extremity some tingling sensation but on light touch exam, she felt symmetrical both sides.  Neurological examination showed no focal deficit.  Etiology for patient's symptoms concerning for complicated migraine without headache versus hypertensive encephalopathy.  However, TIA cannot be can be ruled out at this time.  Recommend aspirin and Plavix DAPT for 3 weeks and then aspirin alone.  Given high LDL, start Crestor 20 for stroke prevention.  Smoke cessation education provided.  Neurology will sign off. Please call with questions. Pt will follow up with stroke clinic NP at [facility] in about 4 weeks. Thanks for the consult.  Marvel Plan, MD PhD Stroke Neurology 07/16/2020 5:14 PM    To contact Stroke Continuity provider, please refer to [email or web address]. After hours, contact General  Neurology

## 2020-07-16 NOTE — Plan of Care (Signed)
  Problem: Education: Goal: Knowledge of General Education information will improve Description: Including pain rating scale, medication(s)/side effects and non-pharmacologic comfort measures 07/16/2020 1410 by Dallas Breeding, RN Outcome: Adequate for Discharge 07/16/2020 0820 by Dallas Breeding, RN Outcome: Progressing   Problem: Health Behavior/Discharge Planning: Goal: Ability to manage health-related needs will improve 07/16/2020 1410 by Dallas Breeding, RN Outcome: Adequate for Discharge 07/16/2020 0820 by Dallas Breeding, RN Outcome: Progressing   Problem: Clinical Measurements: Goal: Ability to maintain clinical measurements within normal limits will improve Outcome: Adequate for Discharge Goal: Will remain free from infection Outcome: Adequate for Discharge Goal: Diagnostic test results will improve Outcome: Adequate for Discharge Goal: Respiratory complications will improve Outcome: Adequate for Discharge Goal: Cardiovascular complication will be avoided Outcome: Adequate for Discharge   Problem: Activity: Goal: Risk for activity intolerance will decrease 07/16/2020 1410 by Dallas Breeding, RN Outcome: Adequate for Discharge 07/16/2020 0820 by Dallas Breeding, RN Outcome: Progressing   Problem: Nutrition: Goal: Adequate nutrition will be maintained 07/16/2020 1410 by Dallas Breeding, RN Outcome: Adequate for Discharge 07/16/2020 0820 by Dallas Breeding, RN Outcome: Progressing   Problem: Coping: Goal: Level of anxiety will decrease 07/16/2020 1410 by Dallas Breeding, RN Outcome: Adequate for Discharge 07/16/2020 0820 by Dallas Breeding, RN Outcome: Progressing   Problem: Pain Managment: Goal: General experience of comfort will improve Outcome: Adequate for Discharge

## 2020-07-16 NOTE — Progress Notes (Signed)
Patient being discharged home.  Patient to be transported by his family.  IV removed with the catheter intact. Discharge instructions and prescription information given to the patient who verbalized understanding.

## 2020-07-16 NOTE — Discharge Summary (Signed)
Name: Leah Rose MRN: [id number] DOB: [birth year] 50 y.o. PCP: Earl Lagos, MD  Date of Admission: 07/15/2020  9:07 AM Date of Discharge: 07/16/2020 Attending Physician: Gust Rung, DO  Discharge Diagnosis:  1. TIA 2. Uncontrolled hypertension 3. Hyperlipidemia 4. Tobacco use disorder 5. Prediabetes 6. Financial difficulties 7. History of abnormal mammogram findings  Discharge Medications: Allergies as of 07/16/2020      Reactions   Penicillins Itching   Sulfonamide Derivatives Itching, Rash      Medication List    STOP taking these medications   lisinopril 40 MG tablet Commonly known as: ZESTRIL   medroxyPROGESTERone 150 MG/ML injection Commonly known as: DEPO-PROVERA   sertraline 50 MG tablet Commonly known as: Zoloft     TAKE these medications   aspirin 325 MG tablet Take 1 tablet (325 mg total) by mouth daily. Start taking on: July 17, 2020   hydrochlorothiazide 12.5 MG capsule Commonly known as: MICROZIDE Take 1 capsule (12.5 mg total) by mouth daily. Start taking on: July 17, 2020   olmesartan 20 MG tablet Commonly known as: BENICAR Take 1 tablet (20 mg total) by mouth daily.   rosuvastatin 10 MG tablet Commonly known as: CRESTOR Take 1 tablet (10 mg total) by mouth daily. Start taking on: July 17, 2020       Disposition and follow-up:   Ms.[name] was discharged from [facility] in Stable condition.  At the hospital follow up visit please address:  Trans-ischemic Accident (TIA). ABCD2 score is 5, placing her at moderate risk (7 day stroke risk 5.9%, 90 day stroke risk 9.8%).  Physical deconditioning. PT evaluation notes decreased strength, activity tolerance, balance difficulties and decreased mobility. PT recommends outpatient PT. New medications: plavix 75mg  daily for 21 days, aspirin 81mg  daily, crestor 10mg  daily Follow up recommendations -DAPT 21 days, followed by aspirin 81mg  alone.  -referral to  outpatient PT placed at discharge -continue to work on mitigating risk factors -ensure neurology follow up (being arranged by inpatient neurology)  Chronic uncontrolled hypertension, poor medication adherence, poor medical follow up. Pt noted having been taking lisinopril 40mg  daily however this is not congruent with medical records. She also notes difficulty with medication affordability despite having insurance. New medications: irbasartan 150mg , hctz 12.5mg  Follow up recommendations -hospital follow up with blood pressure check in 1 week. BMP at time of follow up -Systolic blood pressure goal <152mmHg -place referral to clinical pharmacist to evaluate for medication assistance programs and for ongoing monitoring of adherance -encourage medication adherence -encourage low sodium diet  Hyperlipidemia.  New medications: crestor 20mg  Follow up recommendations -repeat lipid panel in 6-12 months -LDL goal <70  Tobacco use disorder. She expresses interest in cessation. -consider referral to clinical pharmacist to assist with ongoing cessation monitoring and financial assistance -nicotine patches (pt has these already)  Prediabetes. A1C 6.7 this admission. We considered starting a GLP-1, for cardiovascular benefits, this admission however pt has a history of medication non-adherence and it was felt that the addition of a GLP-1 would further decrease medication adherence. -if pt has consistently good follow up, consider discussion of starting this at a future appointment -recheck A1C in 6-12 months  History of abnormal mammogram findings in July 2021 (BI-RADS 4). Mammogram revealed bilateral axillary nodes with thickened cortices that were thought to be benign or reactive however a lymphoproliferative process could not be ruled out so she underwent a left axillary lymph node biopsy which time pathology showed "reactive lymph node with paracortical hyperplasia  and dermatopathic change". -be sure  to encourage annual screening with the next being due in July 2022  Labs / imaging needed at time of follow-up: BMP  Pending labs/ test needing follow-up: none  Follow-up Appointments:  Follow-up Information    [facility] Follow up in 1 week(s).   Why: I have sent a message to the front desk for them to call to arrange an appointment with you sometime in the next week. If you do not hear from our office by Friday, please call and arrange an appointment. Contact information: [address] [place] [address] [phone]              Hospital Course by problem list:  Ms. [name] is a 50 y/o woman with HTN, depression, and chronic venous insufficiency who presented with acute onset right sided weakness, altered sensation, and blurry vision. She was in her usual state of health when she woke up the morning of admission. She went to her normal work shift at 7 am. Approximately an hour in she started feeling "off" like she sometimes will when her blood pressure is high. She noticed headache and lightheadedness. She subsequently developed right sided "heaviness." EMS was called, and she was brought to the ED as a code stroke. Blood pressure was 199/103 on arrival to the ED and she was started on a cleviprex drip. She quickly became normotensive so this was discontinued prior to the admitting team's assessment.  Providers noted her to have subtle right facial droop and right sided numbness/weakness. She was taken to CT which ruled out hemorrhage and LVO. She was offered TPA by neurology, but patient declined. Brain MRI did not reveal acute ischemic findings although did show non-specific white matter findings most consistent with chronic small vessel ischemic disease.   By time of admission, symptoms had improved however still had 4/5 RLE weakness and decreased sensation of the RLE. She underwent an echocardiogram with bubble study which was essentially  unremarkable aside from mild LVH which is likely from her uncontrolled hypertension.   Her symptoms resolved on hospital day #1 and she wished to return home. She underwent an evaluation by PT who felt that pt would require ongoing physical therapy however, due to patient's request to return home, felt this could be done by outpatient PT. She declined outpatient PT as she felt that she would get stronger at home by herself and that she has transportation issues.  We discussed the risk of a stroke and the importance of mitigating risk factors.  She had noted that she had been taking lisinopril 40mg  daily consistently however on chart review, it appears that she was prescribed 90d supply with one refill last April, suggesting that her report is not accurate. When discussing the need for escalation of antihypertensives, she commented that she could not afford medications at this time. After further discussion, she said she could afford some $4 medications. We also discussed the importance of smoking cessation which she said she was interested in. She was unable to pick a quit date but noted that she had nicotine patches at home.   After completing the stroke workup and having returned to, what she reports as, her baseline physical strength, she was medically stable for discharge. She was started on Plavix and Aspirin and was recommended to follow up closely in our clinic for blood pressure management. Neurology was arranging follow up in their office at time of discharge. ~~~~~~~~~~~~~~~~~~~~~~~~~~~~~~~~~~~~~~~~~~~~~~~~~~~~~~~~~~~~~~~~~~~~~~~~~~~~~~~~~  Day of Discharge  Subjective:  No significant overnight events. Discussed the discharge recommendations noted above. Patient states at baseline she has some lower extremity weakness. She feels back to her normal strength.   Discharge Exam:   BP (!) 141/97 (BP Location: Left Arm)   Pulse 77   Temp 98.5 F (36.9 C) (Oral)   Resp 18   Ht 4\' 11"  (1.499  m)   Wt 49.4 kg   SpO2 100%   BMI 22.00 kg/m  Constitutional: well-appearing woman sitting upright in bed, in no acute distress HENT: normocephalic atraumatic, mucous membranes moist Eyes: conjunctiva non-erythematous Neck: supple Cardiovascular: regular rate and rhythm, no m/r/g Pulmonary/Chest: normal work of breathing on room air Neurological: alert & oriented x 3, 5/5 strength in bilateral upper extremities, 4/5 strength in bilateral lower extremities with hip flexion, able to rise to stand independently; sensation intact throughout Skin: warm and dry  Pertinent Labs, Studies, and Procedures:  CBC Latest Ref Rng & Units 07/15/2020 07/15/2020 08/10/2016  WBC 4.0 - 10.5 K/uL - 10.4 7.6  Hemoglobin 12.0 - 15.0 g/dL 15.3(H) 13.8 12.8  Hematocrit 36.0 - 46.0 % 45.0 43.2 40.3  Platelets 150 - 400 K/uL - 263 260   BMP Latest Ref Rng & Units 07/16/2020 07/15/2020 07/15/2020  Glucose 70 - 99 mg/dL 07/17/2020) 517(O) 160(V)  BUN 6 - 20 mg/dL 6 6 5(L)  Creatinine 371(G - 1.00 mg/dL 6.26 9.48 5.46  BUN/Creat Ratio 9 - 23 - - -  Sodium 135 - 145 mmol/L 138 142 137  Potassium 3.5 - 5.1 mmol/L 3.7 3.3(L) 3.4(L)  Chloride 98 - 111 mmol/L 107 107 107  CO2 22 - 32 mmol/L 23 - 20(L)  Calcium 8.9 - 10.3 mg/dL 9.2 - 9.2    CT HEAD CODE STROKE WO CONTRAST Result Date: 07/15/2020 IMPRESSION: 1. Negative CT head 2. ASPECTS is 10   CT ANGIO HEAD NECK W WO CM (CODE STROKE) Result Date: 07/15/2020 CLINICAL DATA:  50 year old female with code stroke presentation, right side symptoms.  FINDINGS: CTA NECK Skeleton: Occasional carious dentition. Reversal of cervical lordosis. No acute osseous abnormality identified. Upper chest: Negative. Other neck: Negative. Aortic arch: Bovine type arch configuration. No arch atherosclerosis. Right carotid system: Normal brachiocephalic artery and right CCA origin without plaque or stenosis. Minimal soft plaque at the posterior right ICA origin without stenosis. Normal right ICA  bulb. Tortuous but otherwise negative distal cervical right ICA. Left carotid system: Bovine left CCA origin without plaque or stenosis. Negative left carotid bifurcation and cervical left ICA. Vertebral arteries: Normal proximal right subclavian artery and right vertebral artery origin. Right vertebral artery is normal aside from mild tortuosity to the skull base. Minimal soft plaque in the proximal left subclavian artery without stenosis. Normal left vertebral artery origin. Codominant left vertebral artery with mild tortuosity is normal to the skull base.  FINDINGS: CTA HEAD Posterior circulation: Mildly ectatic distal vertebral arteries and basilar without plaque or stenosis. Normal PICA origins, vertebrobasilar junction, SCA and PCA origins. Tortuous basilar tip. Posterior communicating arteries are diminutive or absent. Bilateral PCA branches are normal aside from mild tortuosity. Anterior circulation: Both ICA siphons are patent with mild tortuosity and minimal plaque. No siphon stenosis. Normal ophthalmic artery origins. Patent carotid termini with normal MCA and ACA origins. Anterior communicating artery and bilateral ACA branches are normal aside from tortuosity. Left MCA M1 segment, trifurcation and left MCA branches are within normal limits. Right MCA M1 segment and trifurcation are patent without stenosis. Right MCA branches are within  normal limits. Venous sinuses: Patent. Anatomic variants: None. Review of the MIP images confirms the above findings  IMPRESSION: 1. Negative for large vessel occlusion. 2. Generalized arterial tortuosity but minimal atherosclerosis in the head and neck. No arterial stenosis.   MR BRAIN WO CONTRAST Result Date: 07/15/2020 FINDINGS: Brain: Cerebral volume is normal. Mild scattered T2/FLAIR hyperintensity within the cerebral white matter which is nonspecific, but most often secondary to chronic small vessel ischemia. There is no acute infarct. No evidence of  intracranial mass. No chronic intracranial blood products. No extra-axial fluid collection. No midline shift. Vascular: Expected proximal arterial flow voids. Skull and upper cervical spine: No focal marrow lesion. Sinuses/Orbits: Visualized orbits show no acute finding. Trace bilateral ethmoid sinus mucosal thickening  IMPRESSION: No evidence of acute intracranial abnormality. Mild scattered T2/FLAIR hyperintensity within the cerebral white matter which is nonspecific, but most often secondary to chronic small vessel ischemia.   ECHOCARDIOGRAM COMPLETE BUBBLE STUDY Result Date: 07/15/2020 IMPRESSIONS  1. Left ventricular ejection fraction, by estimation, is 60 to 65%. The left ventricle has normal function. The left ventricle has no regional wall motion abnormalities. There is mild concentric left ventricular hypertrophy. Left ventricular diastolic parameters were normal.  2. Right ventricular systolic function is normal. The right ventricular size is normal. There is normal pulmonary artery systolic pressure.  3. The mitral valve is normal in structure. Trivial mitral valve regurgitation. No evidence of mitral stenosis.  4. The aortic valve is tricuspid. Aortic valve regurgitation is not visualized. No aortic stenosis is present.  5. The inferior vena cava is normal in size with greater than 50% respiratory variability, suggesting right atrial pressure of 3 mmHg.  6. Agitated saline contrast bubble study was negative, with no evidence of any interatrial shunt. Comparison(s): No prior Echocardiogram. Conclusion(s)/Recommendation(s): No intracardiac source of embolism detected on this transthoracic study. A transesophageal echocardiogram is recommended to exclude cardiac source of embolism if clinically indicated.    Discharge Instructions: Discharge Instructions    Ambulatory referral to Physical Therapy   Complete by: As directed    Diet - low sodium heart healthy   Complete by: As directed        Elige Radonylee Leiani Enright, MD Internal Medicine Resident PGY-2 [facility] Pager: [phone] 07/16/2020 3:28 PM

## 2020-07-16 NOTE — Plan of Care (Signed)

## 2020-07-16 NOTE — TOC Transition Note (Signed)
Transition of Care [facility]) - CM/SW Discharge Note   Patient Details  Name: Leah Rose MRN: [id number] Date of Birth: 07/16/1970  Transition of Care [facility]) CM/SW Contact:  Kermit Balo, RN Phone Number: 07/16/2020, 1:29 PM   Clinical Narrative:    Patient is discharging home with outpatient therapy. Pt selected to attend at the [facility] location. Information on the AVS.  No DME needs.  Pt lives alone but states she has people to check in on her.  Medications for home to be delivered to the room per [facility] pharmacy.  Pt has transportation home.   Final next level of care: OP Rehab Barriers to Discharge: No Barriers Identified   Patient Goals and CMS Choice     Choice offered to / list presented to : Patient  Discharge Placement                       Discharge Plan and Services                                     Social Determinants of Health (SDOH) Interventions     Readmission Risk Interventions No flowsheet data found.

## 2020-07-17 ENCOUNTER — Telehealth: Payer: Self-pay | Admitting: *Deleted

## 2020-07-17 NOTE — Telephone Encounter (Signed)
Transition Care Management Follow-up Telephone Call  Date of discharge and from where: 07/16/2020 - [facility]  How have you been since you were released from the hospital? "Doing fine"  Any questions or concerns? No  Items Reviewed:  Did the pt receive and understand the discharge instructions provided? Yes   Medications obtained and verified? Yes   Other? No   Any new allergies since your discharge? No   Dietary orders reviewed? No  Do you have support at home? Yes    Functional Questionnaire: (I = Independent and D = Dependent) ADLs: I  Bathing/Dressing- I  Meal Prep- I  Eating- I  Maintaining continence- I  Transferring/Ambulation- I  Managing Meds- I  Follow up appointments reviewed:   PCP Hospital f/u appt confirmed? No    Specialist Hospital f/u appt confirmed? No    Are transportation arrangements needed? No   If their condition worsens, is the pt aware to call PCP or go to the Emergency Dept.? Yes  Was the patient provided with contact information for the PCP's office or ED? Yes  Was to pt encouraged to call back with questions or concerns? Yes

## 2020-07-20 ENCOUNTER — Telehealth: Payer: Self-pay | Admitting: Internal Medicine

## 2020-07-20 NOTE — Telephone Encounter (Signed)
TOC HFU APPT [facility] FOR 07/22/2020 WITH DR. Karilyn Cota

## 2020-07-22 ENCOUNTER — Ambulatory Visit (INDEPENDENT_AMBULATORY_CARE_PROVIDER_SITE_OTHER): Payer: 59 | Admitting: Internal Medicine

## 2020-07-22 ENCOUNTER — Encounter: Payer: Self-pay | Admitting: Internal Medicine

## 2020-07-22 VITALS — BP 126/88 | HR 87 | Temp 98.5°F | Ht 59.0 in | Wt 96.4 lb

## 2020-07-22 DIAGNOSIS — G459 Transient cerebral ischemic attack, unspecified: Secondary | ICD-10-CM

## 2020-07-22 DIAGNOSIS — E139 Other specified diabetes mellitus without complications: Secondary | ICD-10-CM | POA: Diagnosis not present

## 2020-07-22 DIAGNOSIS — E119 Type 2 diabetes mellitus without complications: Secondary | ICD-10-CM | POA: Insufficient documentation

## 2020-07-22 DIAGNOSIS — I10 Essential (primary) hypertension: Secondary | ICD-10-CM | POA: Diagnosis not present

## 2020-07-22 NOTE — Progress Notes (Signed)
   CC: Hospital follow-up  HPI:  [name] is a 50 y.o. past medical history listed below presenting for hospital follow-up.  She is recently hospitalized from 4/13-4/14 for a TIA. For details of today's visit and the status of his chronic medical issues please refer to the assessment and plan.   Past Medical History:  Diagnosis Date  . Chronic venous insufficiency 06/23/2017  . Essential hypertension 03/14/2006  . 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 Tobacco abuse 03/14/2006   Review of Systems:   Constitutional: Weight loss, fatigue, no fevers or chills Cardio: No chest pain, palpitations or leg swelling Neuro: No headaches, dizziness.  Does endorse right-sided leg weakness, numbness and tingling  Physical Exam:  Vitals:   07/22/20 1325  BP: 126/88  Pulse: 87  Temp: 98.5 F (36.9 C)  TempSrc: Oral  SpO2: 100%  Weight: 96 lb 6.4 oz (43.7 kg)  Height: 4\' 11"  (1.499 m)   Physical Exam Vitals reviewed.  Constitutional:      Appearance: Normal appearance.  Cardiovascular:     Rate and Rhythm: Normal rate and regular rhythm.     Pulses: Normal pulses.     Heart sounds: Normal heart sounds. No murmur heard. No friction rub. No gallop.   Pulmonary:     Effort: Pulmonary effort is normal. No respiratory distress.     Breath sounds: Normal breath sounds. No wheezing or rales.  Abdominal:     General: Abdomen is flat. Bowel sounds are normal. There is no distension.     Palpations: Abdomen is soft.     Tenderness: There is no abdominal tenderness. There is no guarding.  Musculoskeletal:        General: No swelling or tenderness.     Right lower leg: No edema.     Left lower leg: No edema.     Comments: RLE strength 4/5, sensation intact  Skin:    General: Skin is warm and dry.  Neurological:     Mental Status: She is alert and oriented to person, place, and time.     Cranial Nerves: No cranial nerve deficit.     Sensory: No sensory deficit.      Motor: Weakness present.     Gait: Gait abnormal.  Psychiatric:        Mood and Affect: Mood normal.        Behavior: Behavior normal.        Thought Content: Thought content normal.        Judgment: Judgment normal.     Assessment & Plan:   See Encounters Tab for problem based charting.  Patient discussed with Dr. 

## 2020-07-22 NOTE — Assessment & Plan Note (Signed)
Presenting for hospital follow up. Recently hospitalized for a TIA thought to be due to uncontrolled HTN. She has continued to have RLE weakness and difficulty walking. She is doing home exercises and plans to work with PT starting in May. She is returning to work next Monday. She is on plavix and aspirin for 3 weeks and then to continue aspirin alone. Encouraged her to keep up the home exercises and PT as well as compliance with her medications.

## 2020-07-22 NOTE — Assessment & Plan Note (Signed)
A1C 6.7 during hospital admission. Considered starting a GLP-1, for cardiovascular benefits, during admission however pt has a history of medication non-adherence and it was felt that the addition of a GLP-1 would further decrease medication adherence. Given her low weight would not recommend a GLP-1 in her, but can consider SGL-2 inhibitors. Will hold off at this time and recheck Hgb A1c in 3 months.  Plan: Follow-up hemoglobin A1c in 3 months Encouraged lifestyle modifications

## 2020-07-22 NOTE — Assessment & Plan Note (Signed)
Vitals:   07/22/20 1325  BP: 126/88  Pulse: 87  Temp: 98.5 F (36.9 C)  SpO2: 100%   BP at goal. Started olmesartan 20 mg and hctz 12.5 mg. Will check BMP today.  Plan:  Continue olmesartan 20 mg and hctz 12.5 mg daily Follow up BMP

## 2020-07-22 NOTE — Patient Instructions (Signed)
Thank you for allowing [place] to provide your care today. Today we discussed your hospitalization.    I have ordered labs for you. I will call if any are abnormal.    Today we made no changes to your medications.    Please follow-up in 3 months.    Should you have any questions or concerns please call the internal medicine clinic at [phone].

## 2020-07-23 LAB — BMP8+ANION GAP
Anion Gap: 18 mmol/L (ref 10.0–18.0)
BUN/Creatinine Ratio: 18 (ref 9–23)
BUN: 13 mg/dL (ref 6–24)
CO2: 21 mmol/L (ref 20–29)
Calcium: 9.5 mg/dL (ref 8.7–10.2)
Chloride: 94 mmol/L — ABNORMAL LOW (ref 96–106)
Creatinine, Ser: 0.71 mg/dL (ref 0.57–1.00)
Glucose: 94 mg/dL (ref 65–99)
Potassium: 4.6 mmol/L (ref 3.5–5.2)
Sodium: 133 mmol/L — ABNORMAL LOW (ref 134–144)
eGFR: 104 mL/min/{1.73_m2} (ref >59)

## 2020-07-23 NOTE — Telephone Encounter (Signed)
Pt was seen yesterday by Dr Karilyn Cota.

## 2020-07-29 NOTE — Progress Notes (Signed)
Internal Medicine Clinic Attending ° °Case discussed with Dr. Rehman  At the time of the visit.  We reviewed the resident’s history and exam and pertinent patient test results.  I agree with the assessment, diagnosis, and plan of care documented in the resident’s note.  ° °

## 2020-08-17 ENCOUNTER — Other Ambulatory Visit (HOSPITAL_COMMUNITY): Payer: Self-pay

## 2020-08-17 ENCOUNTER — Telehealth: Payer: Self-pay | Admitting: Internal Medicine

## 2020-08-18 ENCOUNTER — Other Ambulatory Visit (HOSPITAL_COMMUNITY): Payer: Self-pay

## 2020-08-18 MED ORDER — ROSUVASTATIN CALCIUM 20 MG PO TABS
20.0000 mg | ORAL_TABLET | Freq: Every day | ORAL | 0 refills | Status: DC
  Filled 2020-08-18 – 2020-08-29 (×2): qty 30, 30d supply, fill #0

## 2020-08-18 MED ORDER — HYDROCHLOROTHIAZIDE 12.5 MG PO TABS
12.5000 mg | ORAL_TABLET | Freq: Every day | ORAL | 0 refills | Status: DC
  Filled 2020-08-18 – 2020-08-29 (×2): qty 30, 30d supply, fill #0

## 2020-08-18 MED ORDER — OLMESARTAN MEDOXOMIL 20 MG PO TABS
20.0000 mg | ORAL_TABLET | Freq: Every day | ORAL | 0 refills | Status: DC
  Filled 2020-08-18 – 2020-08-29 (×2): qty 30, 30d supply, fill #0

## 2020-08-18 NOTE — Telephone Encounter (Signed)
Spoke with the patient.  She has sch her F/u for 10/20/2020 @ 10:15 am.

## 2020-08-26 ENCOUNTER — Other Ambulatory Visit (HOSPITAL_COMMUNITY): Payer: Self-pay

## 2020-08-29 ENCOUNTER — Other Ambulatory Visit (HOSPITAL_COMMUNITY): Payer: Self-pay

## 2020-09-01 ENCOUNTER — Other Ambulatory Visit (HOSPITAL_COMMUNITY): Payer: Self-pay

## 2020-09-05 ENCOUNTER — Other Ambulatory Visit (HOSPITAL_COMMUNITY): Payer: Self-pay

## 2020-09-05 ENCOUNTER — Other Ambulatory Visit: Payer: Self-pay

## 2020-09-05 ENCOUNTER — Emergency Department (HOSPITAL_COMMUNITY): Payer: 59

## 2020-09-05 ENCOUNTER — Encounter (HOSPITAL_COMMUNITY): Payer: Self-pay

## 2020-09-05 ENCOUNTER — Emergency Department (HOSPITAL_COMMUNITY)
Admission: EM | Admit: 2020-09-05 | Discharge: 2020-09-05 | Disposition: A | Discharge status: Home / Self Care | Payer: 59 | Attending: Emergency Medicine | Admitting: Emergency Medicine

## 2020-09-05 DIAGNOSIS — E119 Type 2 diabetes mellitus without complications: Secondary | ICD-10-CM | POA: Insufficient documentation

## 2020-09-05 DIAGNOSIS — F17200 Nicotine dependence, unspecified, uncomplicated: Secondary | ICD-10-CM | POA: Diagnosis not present

## 2020-09-05 DIAGNOSIS — R202 Paresthesia of skin: Secondary | ICD-10-CM | POA: Diagnosis present

## 2020-09-05 DIAGNOSIS — I10 Essential (primary) hypertension: Secondary | ICD-10-CM | POA: Diagnosis not present

## 2020-09-05 DIAGNOSIS — M5417 Radiculopathy, lumbosacral region: Secondary | ICD-10-CM

## 2020-09-05 DIAGNOSIS — G459 Transient cerebral ischemic attack, unspecified: Secondary | ICD-10-CM | POA: Diagnosis not present

## 2020-09-05 DIAGNOSIS — Z7982 Long term (current) use of aspirin: Secondary | ICD-10-CM | POA: Diagnosis not present

## 2020-09-05 DIAGNOSIS — Z20822 Contact with and (suspected) exposure to covid-19: Secondary | ICD-10-CM | POA: Insufficient documentation

## 2020-09-05 DIAGNOSIS — Z79899 Other long term (current) drug therapy: Secondary | ICD-10-CM | POA: Insufficient documentation

## 2020-09-05 DIAGNOSIS — R200 Anesthesia of skin: Secondary | ICD-10-CM

## 2020-09-05 DIAGNOSIS — F419 Anxiety disorder, unspecified: Secondary | ICD-10-CM | POA: Diagnosis not present

## 2020-09-05 LAB — DIFFERENTIAL
Abs Immature Granulocytes: 0.1 10*3/uL — ABNORMAL HIGH (ref 0.00–0.07)
Basophils Absolute: 0.1 10*3/uL (ref 0.0–0.1)
Basophils Relative: 1 %
Eosinophils Absolute: 0.1 10*3/uL (ref 0.0–0.5)
Eosinophils Relative: 1 %
Immature Granulocytes: 1 %
Lymphocytes Relative: 19 %
Lymphs Abs: 2.5 10*3/uL (ref 0.7–4.0)
Monocytes Absolute: 1.1 10*3/uL — ABNORMAL HIGH (ref 0.1–1.0)
Monocytes Relative: 8 %
Neutro Abs: 9.7 10*3/uL — ABNORMAL HIGH (ref 1.7–7.7)
Neutrophils Relative %: 70 %

## 2020-09-05 LAB — COMPREHENSIVE METABOLIC PANEL
ALT: 17 U/L (ref 0–44)
AST: 23 U/L (ref 15–41)
Albumin: 4.1 g/dL (ref 3.5–5.0)
Alkaline Phosphatase: 68 U/L (ref 38–126)
Anion gap: 12 (ref 5–15)
BUN: 12 mg/dL (ref 6–20)
CO2: 21 mmol/L — ABNORMAL LOW (ref 22–32)
Calcium: 9.2 mg/dL (ref 8.9–10.3)
Chloride: 106 mmol/L (ref 98–111)
Creatinine, Ser: 0.73 mg/dL (ref 0.44–1.00)
GFR, Estimated: >60 mL/min (ref >60)
Glucose, Bld: 84 mg/dL (ref 70–99)
Potassium: 4 mmol/L (ref 3.5–5.1)
Sodium: 139 mmol/L (ref 135–145)
Total Bilirubin: 0.1 mg/dL — ABNORMAL LOW (ref 0.3–1.2)
Total Protein: 8.1 g/dL (ref 6.5–8.1)

## 2020-09-05 LAB — ETHANOL: Alcohol, Ethyl (B): <10 mg/dL (ref <10)

## 2020-09-05 LAB — APTT: aPTT: 26 seconds (ref 24–36)

## 2020-09-05 LAB — I-STAT CHEM 8, ED
BUN: 13 mg/dL (ref 6–20)
Calcium, Ion: 1.03 mmol/L — ABNORMAL LOW (ref 1.15–1.40)
Chloride: 113 mmol/L — ABNORMAL HIGH (ref 98–111)
Creatinine, Ser: 0.6 mg/dL (ref 0.44–1.00)
Glucose, Bld: 82 mg/dL (ref 70–99)
HCT: 45 % (ref 36.0–46.0)
Hemoglobin: 15.3 g/dL — ABNORMAL HIGH (ref 12.0–15.0)
Potassium: 4 mmol/L (ref 3.5–5.1)
Sodium: 142 mmol/L (ref 135–145)
TCO2: 21 mmol/L — ABNORMAL LOW (ref 22–32)

## 2020-09-05 LAB — RESP PANEL BY RT-PCR (FLU A&B, COVID) ARPGX2
Influenza A by PCR: NEGATIVE
Influenza B by PCR: NEGATIVE
SARS Coronavirus 2 by RT PCR: NEGATIVE

## 2020-09-05 LAB — I-STAT BETA HCG BLOOD, ED (MC, WL, AP ONLY): I-stat hCG, quantitative: <5 m[IU]/mL (ref <5)

## 2020-09-05 LAB — CBC
HCT: 41.2 % (ref 36.0–46.0)
Hemoglobin: 13.1 g/dL (ref 12.0–15.0)
MCH: 26 pg (ref 26.0–34.0)
MCHC: 31.8 g/dL (ref 30.0–36.0)
MCV: 81.7 fL (ref 80.0–100.0)
Platelets: 359 10*3/uL (ref 150–400)
RBC: 5.04 MIL/uL (ref 3.87–5.11)
RDW: 15.2 % (ref 11.5–15.5)
WBC: 13.6 10*3/uL — ABNORMAL HIGH (ref 4.0–10.5)
nRBC: 0 % (ref 0.0–0.2)

## 2020-09-05 LAB — CBG MONITORING, ED: Glucose-Capillary: 76 mg/dL (ref 70–99)

## 2020-09-05 LAB — PROTIME-INR
INR: 1 (ref 0.8–1.2)
Prothrombin Time: 13.5 seconds (ref 11.4–15.2)

## 2020-09-05 IMAGING — MR MR HEAD W/O CM
10 of 11 series · 43 of 48 positions shown · IV contrast (gadavist)
Comparison: CT study same day.  MRI [DATE].

CLINICAL DATA: Right lower extremity deficits.

EXAM:
MRI HEAD WITHOUT  CONTRAST
MRA HEAD WITHOUT CONTRAST
MRA NECK WITHOUT AND WITH CONTRAST
TECHNIQUE: Multiplanar, multiecho pulse sequences of the brain and surrounding
structures were obtained without and with intravenous contrast.
Angiographic images of the Circle of Willis were obtained using MRA
technique without intravenous contrast. Angiographic images of the
neck were obtained using MRA technique without and with intravenous
contrast. Carotid stenosis measurements (when applicable) are
obtained utilizing NASCET criteria, using the distal internal
carotid diameter as the denominator.
CONTRAST:  5mL GADAVIST GADOBUTROL 1 MMOL/ML IV SOLN

[Series 5: DWI · axial · 3.0mm · 0.88mm/px · z∈[-97,+43]mm · 10 of 96 slices shown (1 of 4)]
[im 1/96]
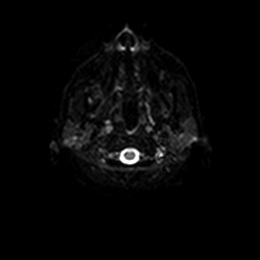
[im 11/96]
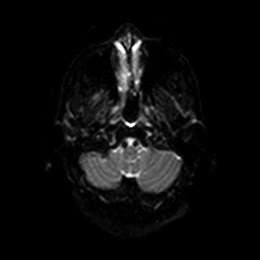
[im 22/96]
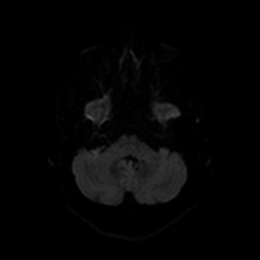
[im 32/96]
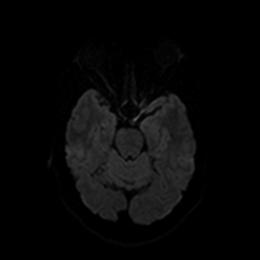
[im 43/96]
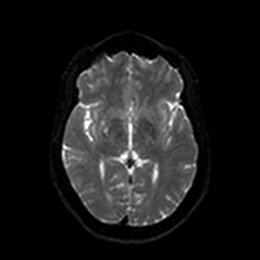
[im 53/96]
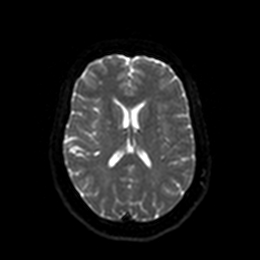
[im 64/96]
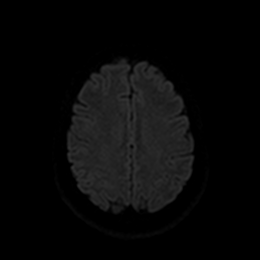
[im 74/96]
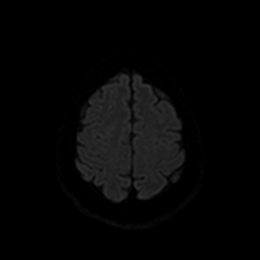
[im 85/96]
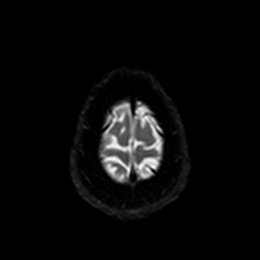
[im 96/96]
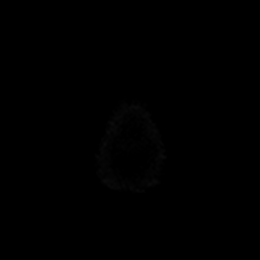

[Series 6: DWI · axial · 3.0mm · 0.88mm/px · z∈[-97,+43]mm · 5 of 48 slices shown (2 of 4)]
[im 1/48]
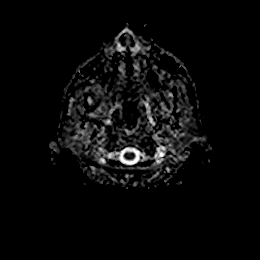
[im 12/48]
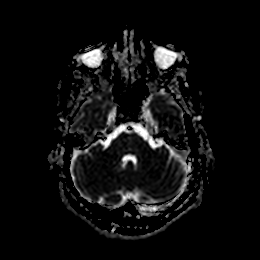
[im 24/48]
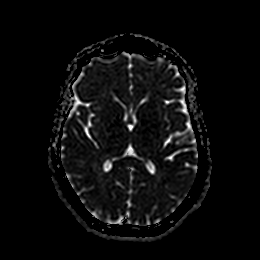
[im 36/48]
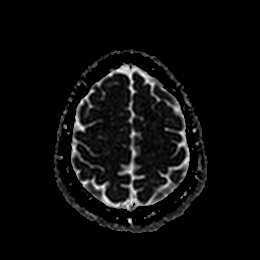
[im 48/48]
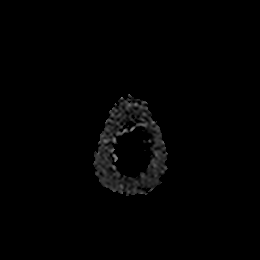

[Series 7: DWI · coronal · 4.0mm · 0.88mm/px · 6 of 68 slices shown (3 of 4)]
[im 1/68]
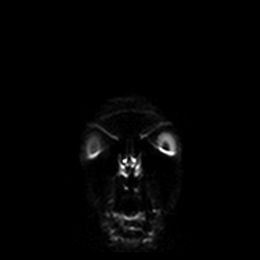
[im 14/68]
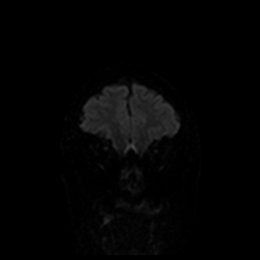
[im 27/68]
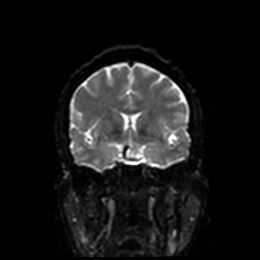
[im 41/68]
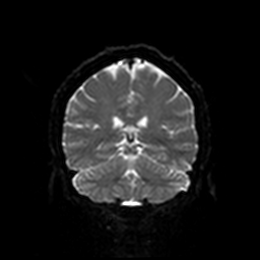
[im 54/68]
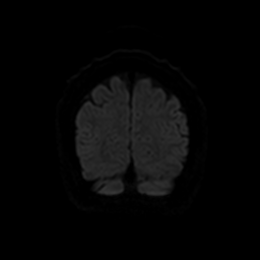
[im 68/68]
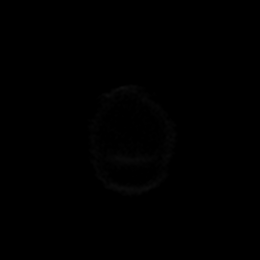

[Series 8: DWI · coronal · 4.0mm · 0.88mm/px · 3 of 34 slices shown (4 of 4)]
[im 1/34]
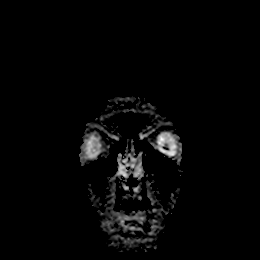
[im 17/34]
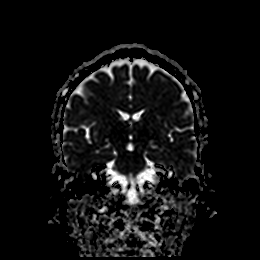
[im 34/34]
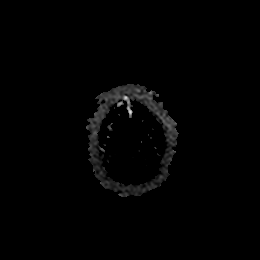

[Series 9: FLAIR · axial · 5.0mm · 0.45mm/px · z∈[-97,+46]mm · 2 of 25 slices shown]
[im 1/25]
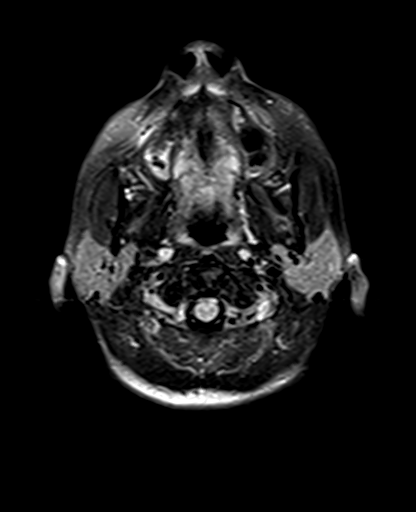
[im 25/25]
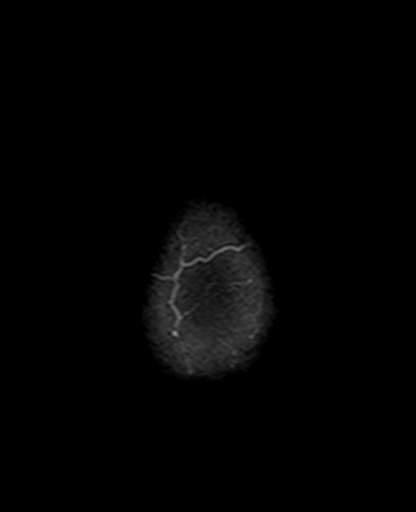

[Series 11: pha_images · axial · 3.0mm · 0.90mm/px · z∈[-101,+50]mm · 5 of 51 slices shown]
[im 1/51]
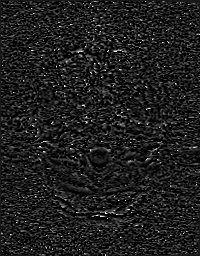
[im 13/51]
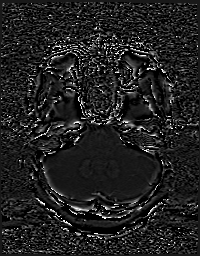
[im 26/51]
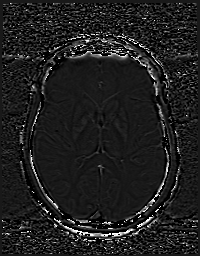
[im 38/51]
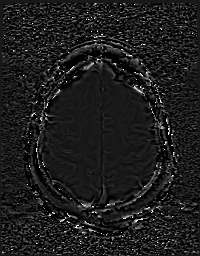
[im 51/51]
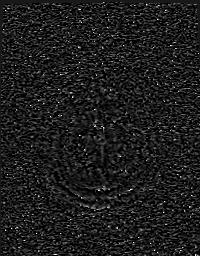

[Series 12: swi_images · axial · 3.0mm · 0.90mm/px · z∈[-101,+50]mm · 5 of 52 slices shown]
[im 1/52]
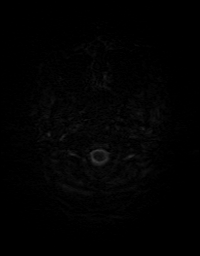
[im 13/52]
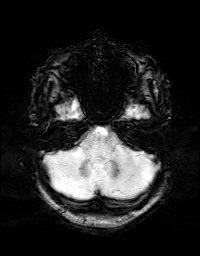
[im 26/52]
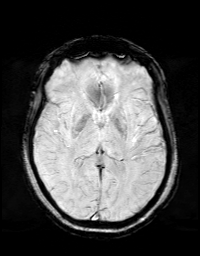
[im 39/52]
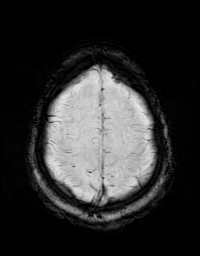
[im 52/52]
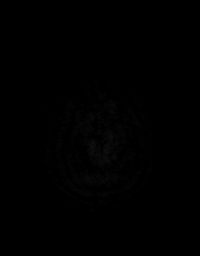

[Series 14: T1 · sagittal · 5.0mm · 0.90mm/px · 2 of 23 slices shown]
[im 1/23]
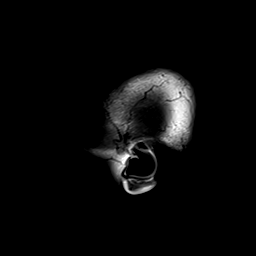
[im 23/23]
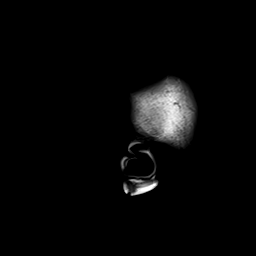

[Series 15: T2 · axial · 5.0mm · 0.72mm/px · z∈[-98,+44]mm · 2 of 25 slices shown (1 of 2)]
[im 1/25]
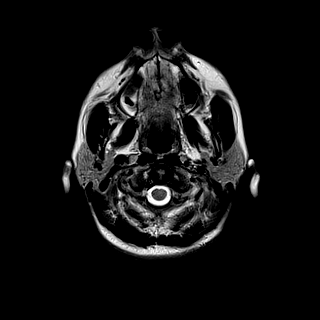
[im 25/25]
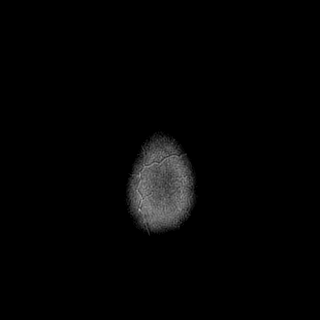

[Series 17: T2 · coronal · 5.0mm · 0.34mm/px · 3 of 29 slices shown (2 of 2)]
[im 1/29]
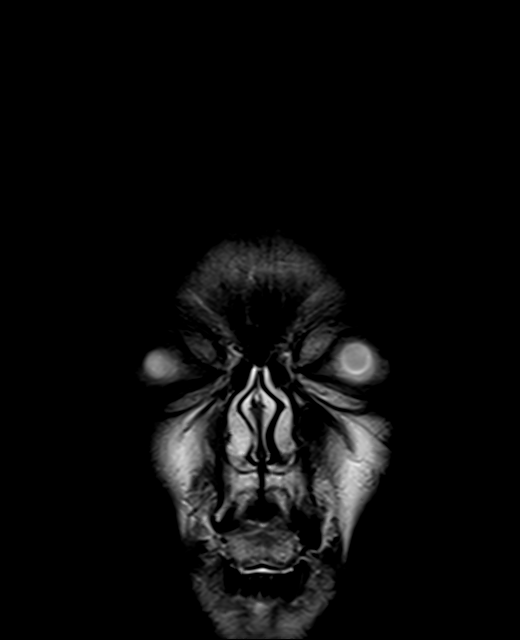
[im 15/29]
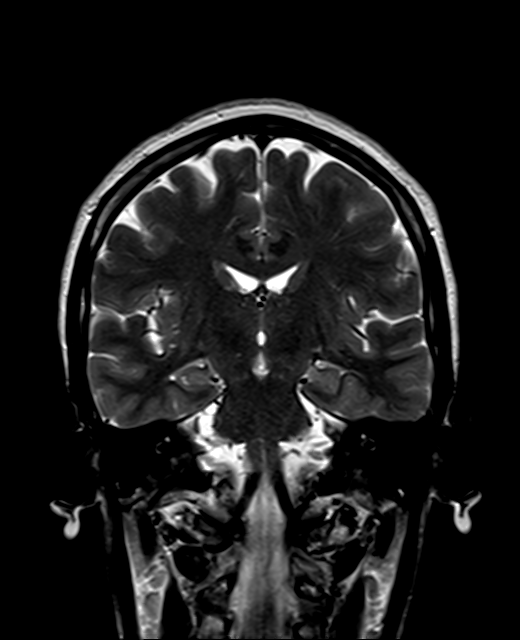
[im 29/29]
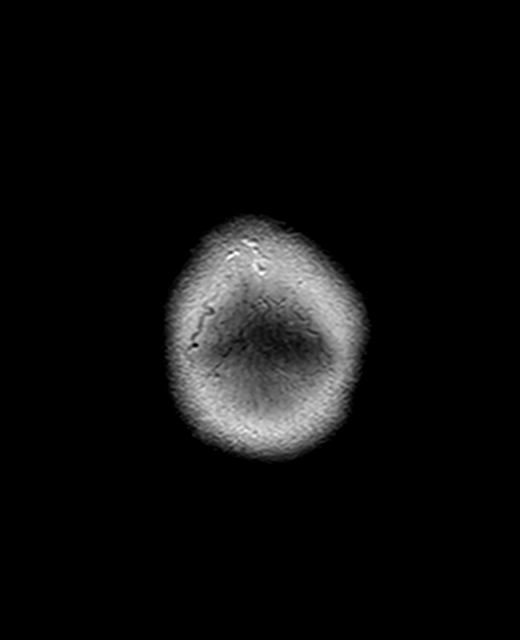

[43 of 48 positions shown; findings below may reference images not displayed]

FINDINGS: MRI HEAD FINDINGS

Brain: Diffusion imaging does not show any acute or subacute
infarction. The brainstem and cerebellum are normal. Cerebral
hemispheres are normal except for a few scattered punctate foci of
T2 and FLAIR signal in the frontal white matter, not likely
significant. No cortical or large vessel territory infarction. No
mass lesion, hemorrhage, hydrocephalus or extra-axial collection.

Vascular: Major vessels at the base of the brain show flow.

Skull and upper cervical spine: Negative

Sinuses/Orbits: Clear/normal

Other: None

MRA HEAD FINDINGS

Both internal carotid arteries are widely patent into the brain. No
siphon stenosis. The anterior and middle cerebral vessels are patent
without proximal stenosis, aneurysm or vascular malformation.

Both vertebral arteries are widely patent to the basilar. No basilar
stenosis. Posterior circulation branch vessels appear normal.

MRA NECK FINDINGS

Branching pattern of great vessels from the arch is normal. No
origin stenosis. Both common carotid arteries widely patent the
bifurcation. Both carotid bifurcations are normal. Both cervical
internal carotid arteries are normal.

Both vertebral artery origins are widely patent. Both vertebral
arteries appear normal through the neck to the foramen magnum.
IMPRESSION: Normal intracranial MR angiography. Normal MR angiography of the
neck vessels.

No acute brain finding. Few punctate foci of T2 and FLAIR signal
within the frontal white matter, not changed from the previous
study. These can be seen as incidental and insignificant findings or
that could represent the very earliest manifestation of small vessel
change.

## 2020-09-05 IMAGING — CT CT HEAD CODE STROKE
3 series · 14 of 45 positions shown, 16 images · non-contrast
Comparison: Brain MRI and head CT [DATE] and earlier.

CLINICAL DATA: Code stroke. 49-year-old female with right lower
extremity deficits.

EXAM:
CT HEAD WITHOUT CONTRAST
TECHNIQUE: Contiguous axial images were obtained from the base of the skull
through the vertex without intravenous contrast.

[Series 3: head 5.0 st · axial · 0.42mm/px · z∈[+1242,+1358]mm · 8 of 28 slices shown, 10 images]
[im 3/28  brain]
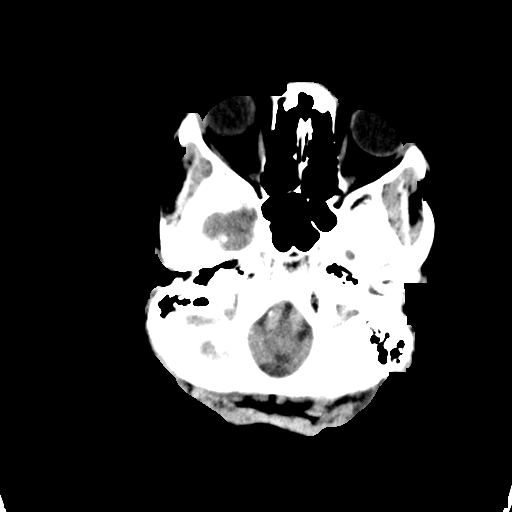
[im 3/28  bone]
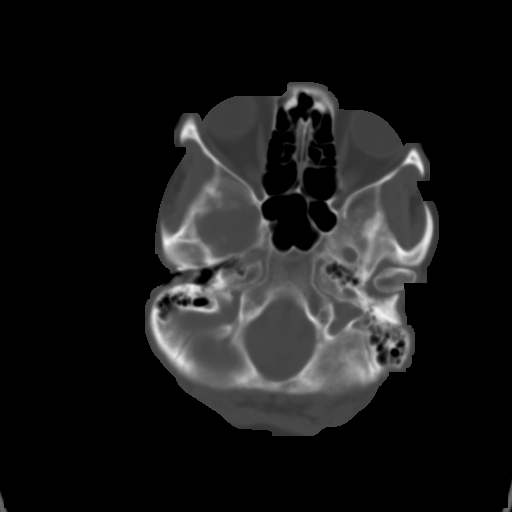
[im 6/28  brain]
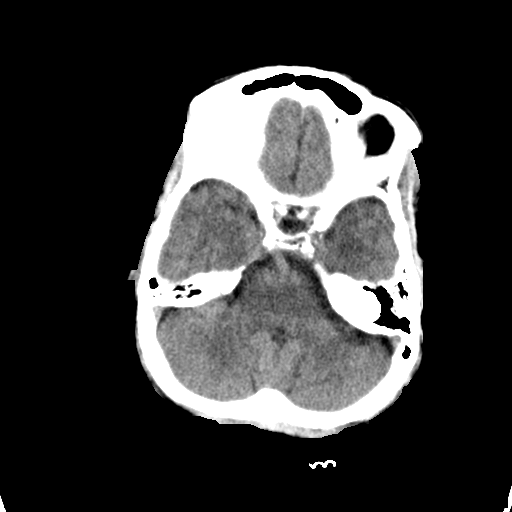
[im 10/28  brain]
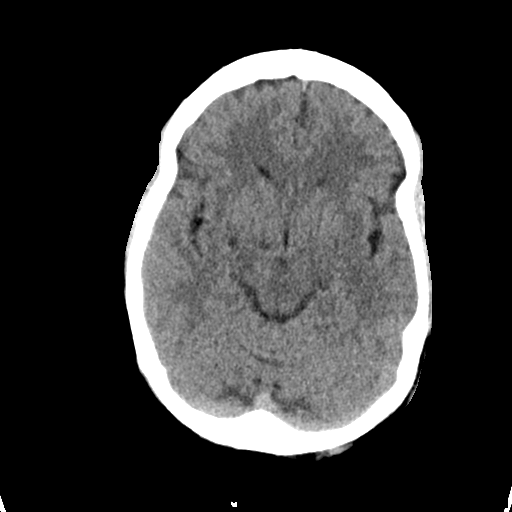
[im 13/28  brain]
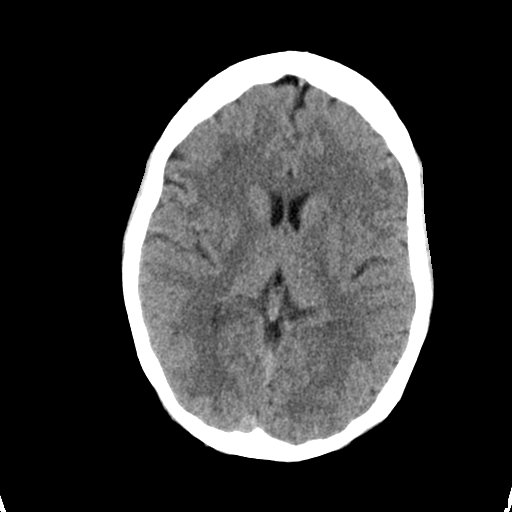
[im 16/28  brain]
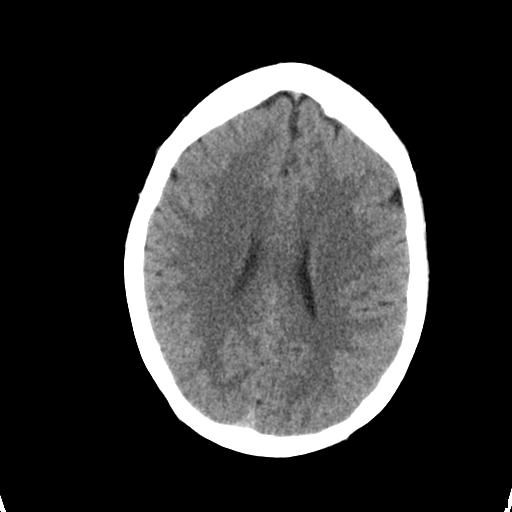
[im 16/28  bone]
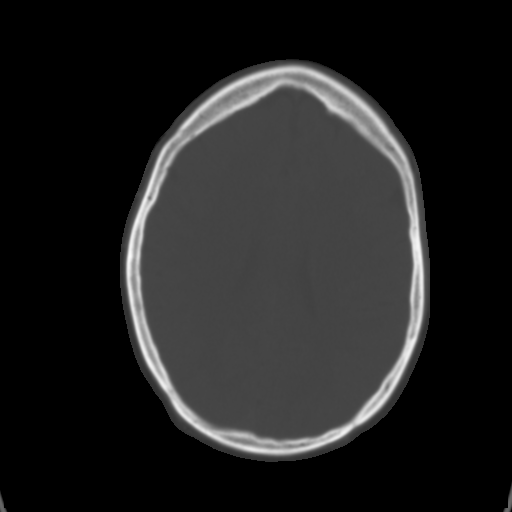
[im 19/28  brain]
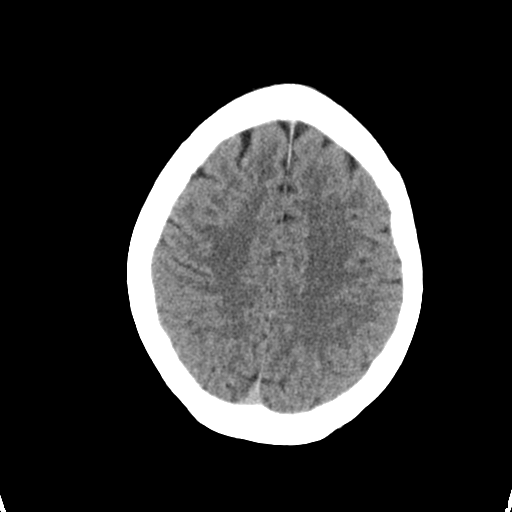
[im 23/28  brain]
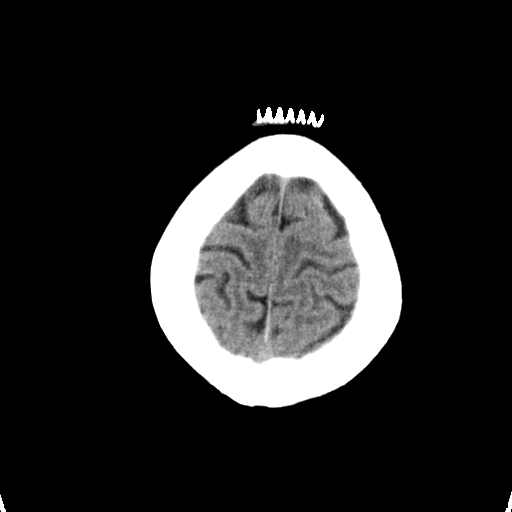
[im 26/28  brain]
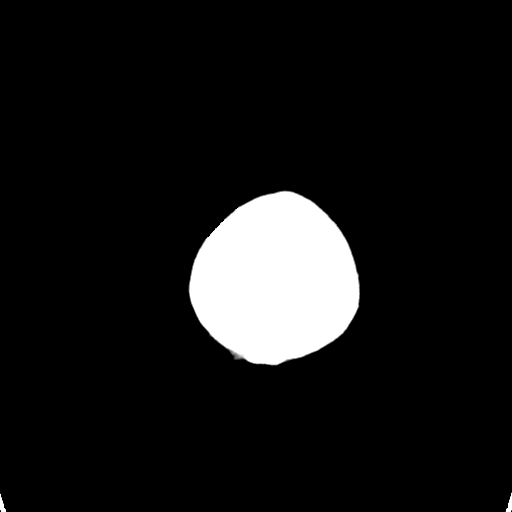

[Series 5: head 3.0 cor st · coronal · 0.29mm/px · 3 of 61 slices shown]
[im 21/61  brain]
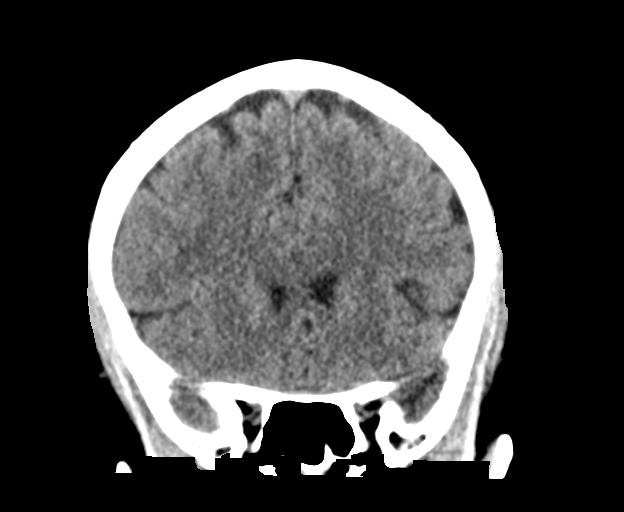
[im 27/61  brain]
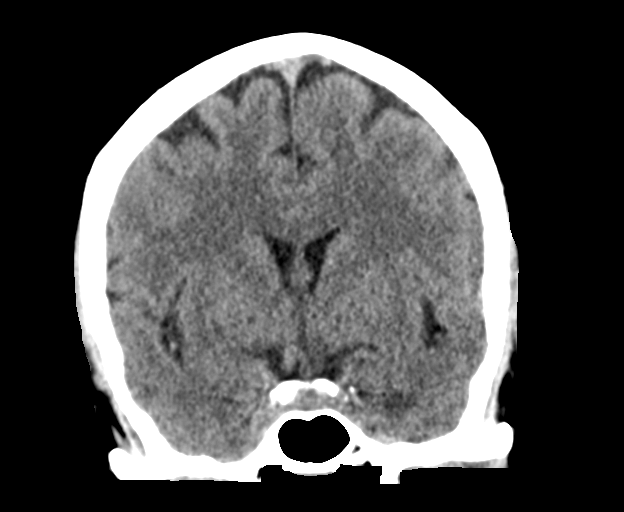
[im 34/61  brain]
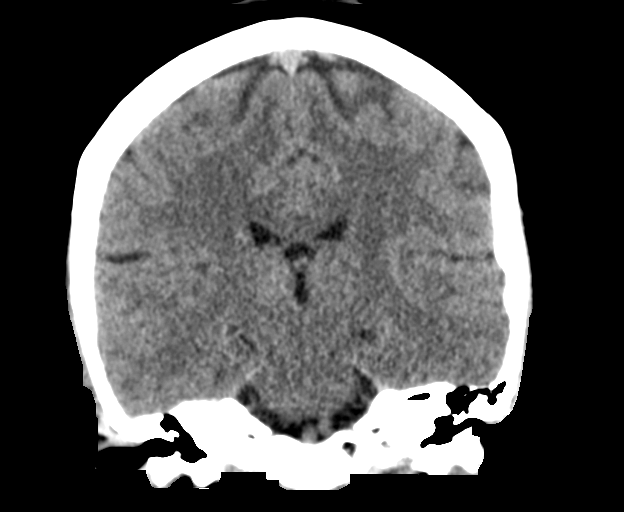

[Series 6: head 3.0 sag st · sagittal · 0.30mm/px · 3 of 48 slices shown]
[im 16/48  brain]
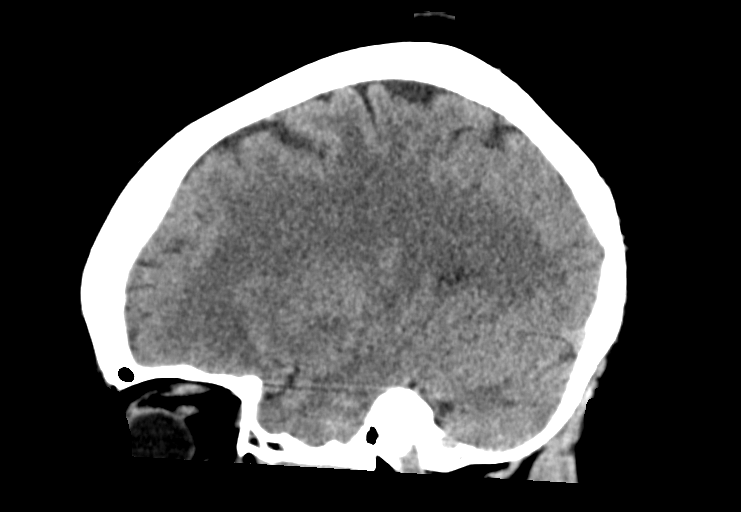
[im 24/48  brain]
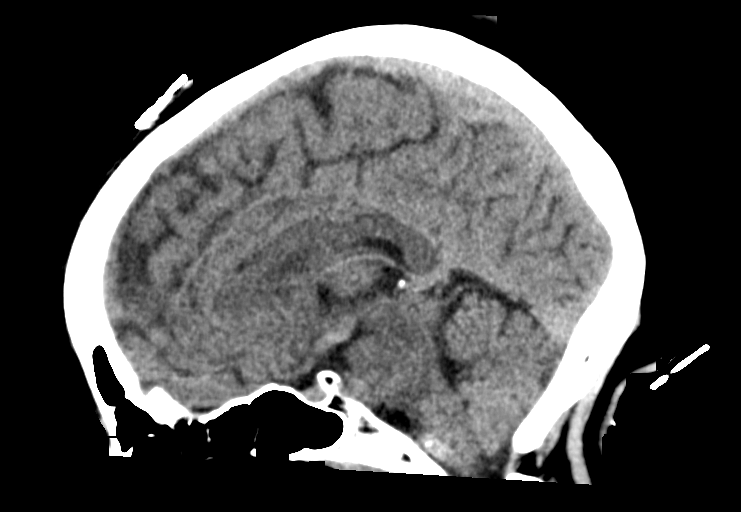
[im 32/48  brain]
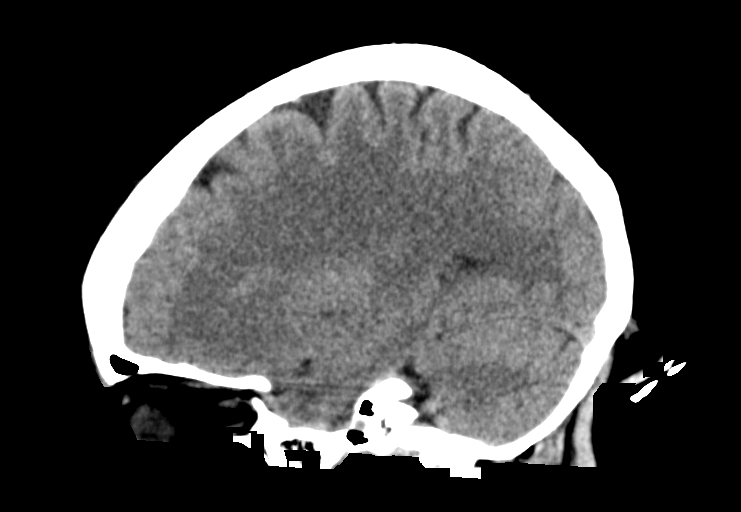

[14 of 45 positions shown; findings below may reference images not displayed]

FINDINGS: Brain: No midline shift, ventriculomegaly, mass effect, evidence of
mass lesion, intracranial hemorrhage or evidence of cortically based
acute infarction. Gray-white matter differentiation is within normal
limits throughout the brain.

Vascular: Mild Calcified atherosclerosis at the skull base. Tortuous
basilar artery redemonstrated. No suspicious intracranial vascular
hyperdensity.

Skull: Negative.

Sinuses/Orbits: Visualized paranasal sinuses and mastoids are stable
and well aerated.

Other: Stable, negative orbits and scalp.

ASPECTS ([place]berta Stroke Program Early CT Score)

Total score (0-10 with 10 being normal): 10
IMPRESSION: 1. Stable and normal noncontrast CT appearance of the brain, aside
from tortuous basilar artery. ASPECTS 10.

2. These results were communicated to Dr. KHAYRIYE at [DATE] KHAYRIYE
[DATE]by text page via the [employer] messaging system.

## 2020-09-05 IMAGING — MR MR MRA NECK WO/W CM
7 of 8 series · 42 of 48 positions shown · IV contrast (Gadavist)
Comparison: CT study same day.  MRI [DATE].

CLINICAL DATA: Right lower extremity deficits.

EXAM:
MRI HEAD WITHOUT  CONTRAST
MRA HEAD WITHOUT CONTRAST
MRA NECK WITHOUT AND WITH CONTRAST
TECHNIQUE: Multiplanar, multiecho pulse sequences of the brain and surrounding
structures were obtained without and with intravenous contrast.
Angiographic images of the Circle of Willis were obtained using MRA
technique without intravenous contrast. Angiographic images of the
neck were obtained using MRA technique without and with intravenous
contrast. Carotid stenosis measurements (when applicable) are
obtained utilizing NASCET criteria, using the distal internal
carotid diameter as the denominator.
CONTRAST:  5mL GADAVIST GADOBUTROL 1 MMOL/ML IV SOLN

[Series 7: tof_fl3d_tra_iso · axial · 0.6mm · 0.52mm/px · z∈[-179,-103]mm · 8 of 133 slices shown]
[im 1/133]
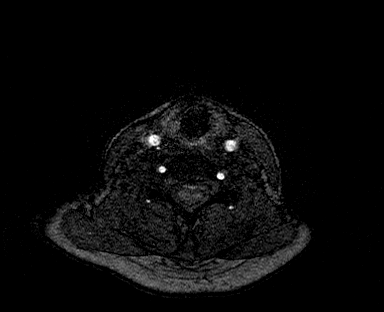
[im 19/133]
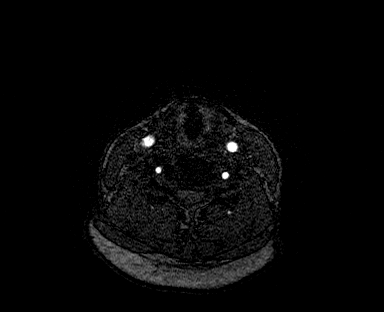
[im 38/133]
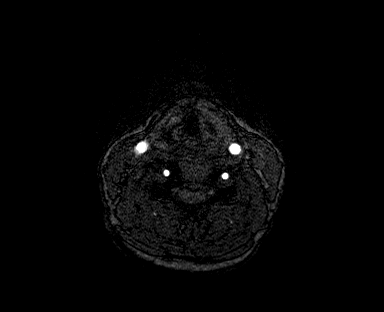
[im 57/133]
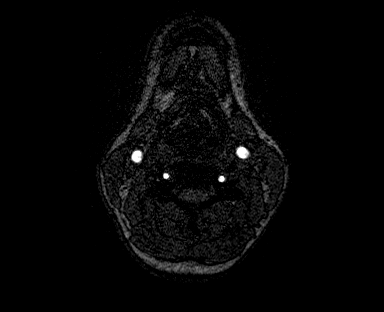
[im 76/133]
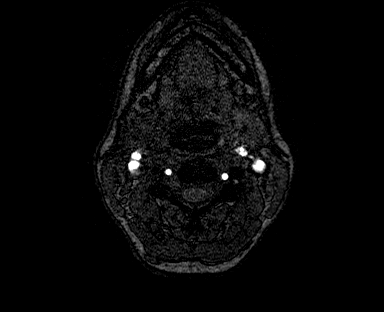
[im 95/133]
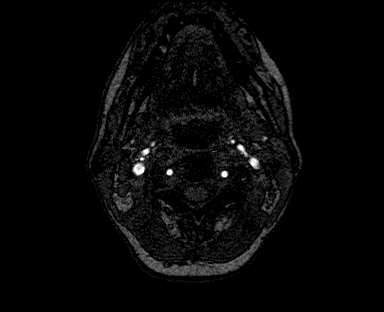
[im 114/133]
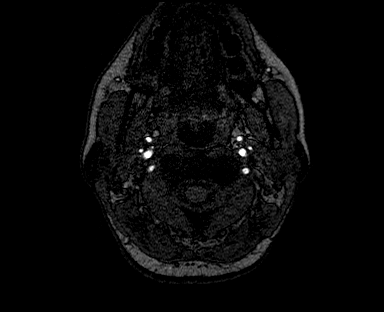
[im 133/133]
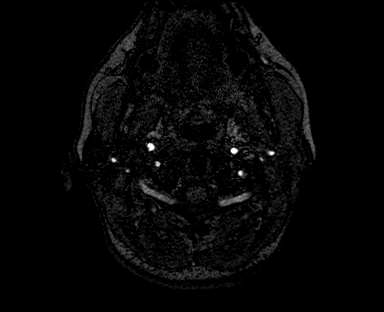

[Series 10: angio_fl3d_cor_pre_ttc=3.0s · coronal · 0.9mm · 0.85mm/px · 5 of 80 slices shown]
[im 1/80]
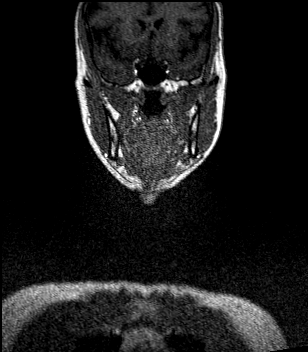
[im 20/80]
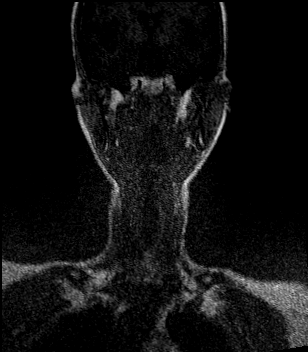
[im 40/80]
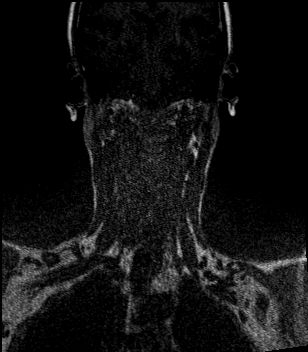
[im 60/80]
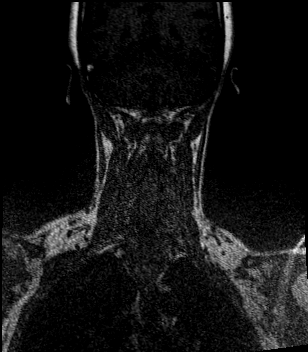
[im 80/80]
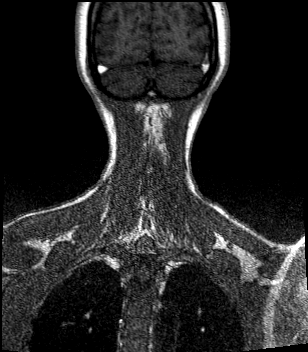

[Series 12: angio_fl3d_cor_post_ttc=3.0s · coronal · 0.9mm · 0.85mm/px · 6 of 80 slices shown (1 of 2)]
[im 1/80]
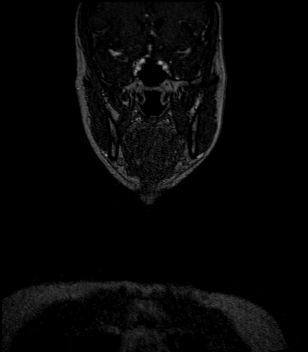
[im 16/80]
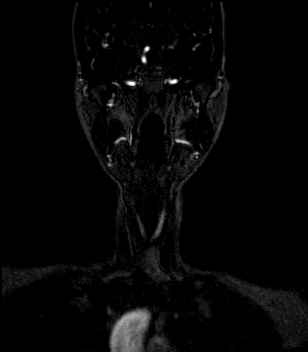
[im 32/80]
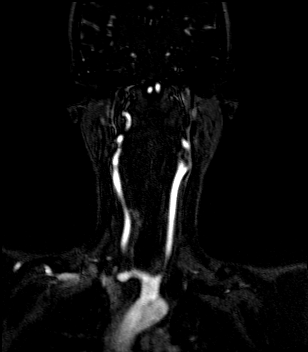
[im 48/80]
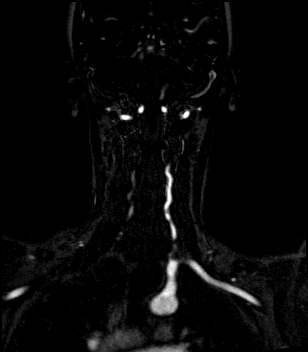
[im 64/80]
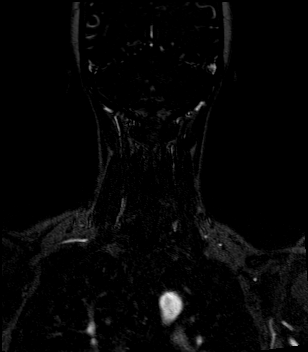
[im 80/80]
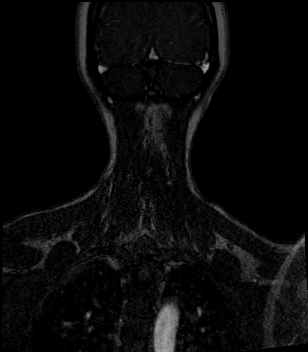

[Series 13: angio_fl3d_cor_post_ttc=3.0s_moco-adv · coronal · 0.9mm · 0.85mm/px · 6 of 79 slices shown (1 of 2)]
[im 1/79]
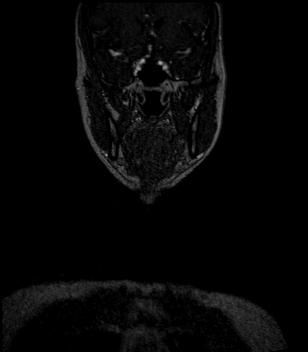
[im 16/79]
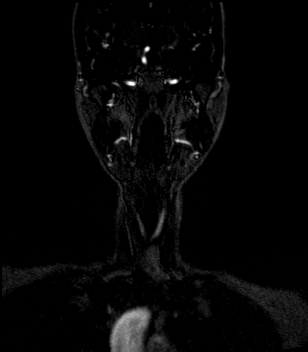
[im 32/79]
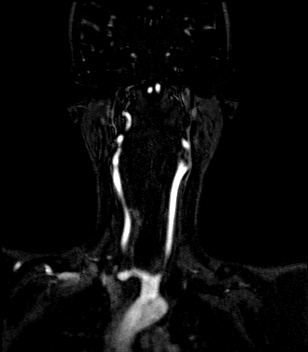
[im 47/79]
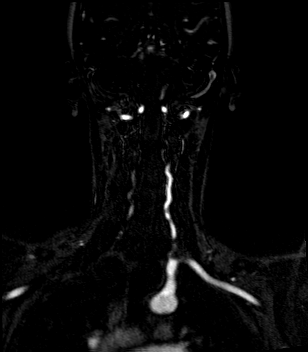
[im 63/79]
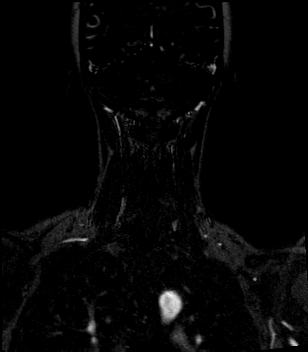
[im 79/79]
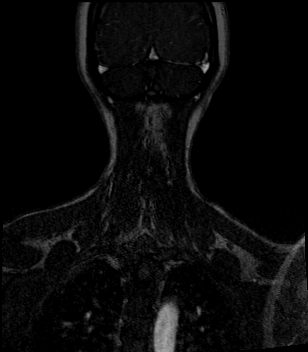

[Series 14: angio_fl3d_cor_post_ttc=3.0s_moco-adv_sub · coronal · 0.9mm · 0.85mm/px · 5 of 77 slices shown]
[im 1/77]
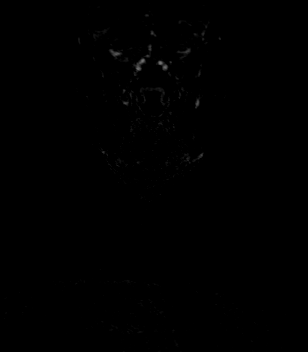
[im 20/77]
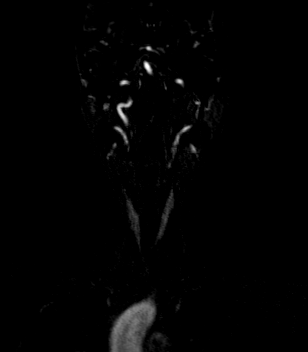
[im 39/77]
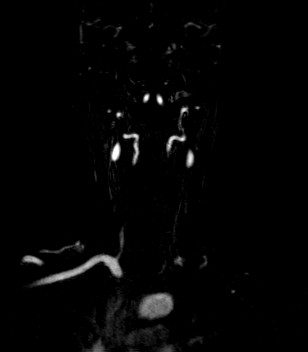
[im 58/77]
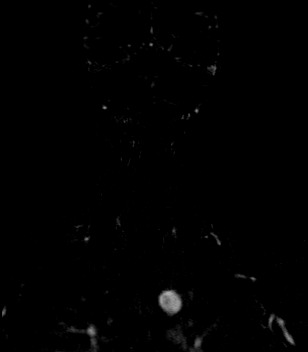
[im 77/77]
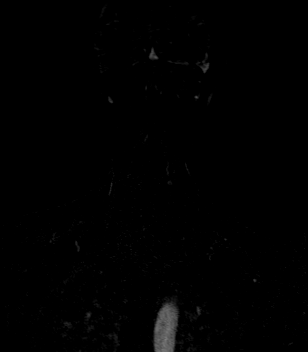

[Series 16: angio_fl3d_cor_post_ttc=3.0s · coronal · 0.9mm · 0.85mm/px · 6 of 80 slices shown (2 of 2)]
[im 1/80]
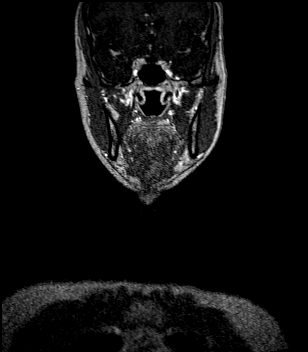
[im 16/80]
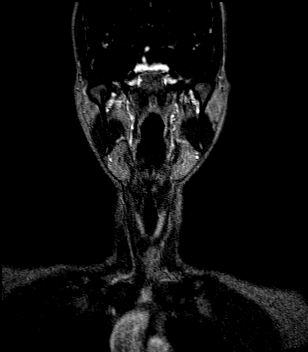
[im 32/80]
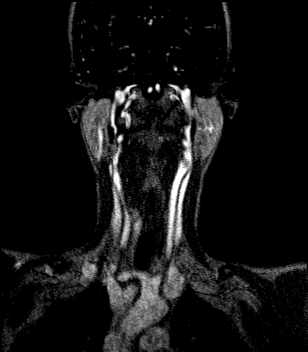
[im 48/80]
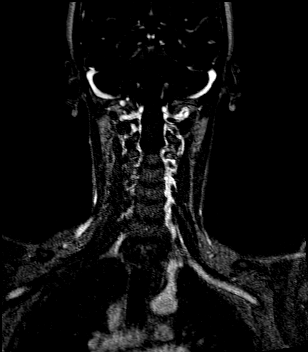
[im 64/80]
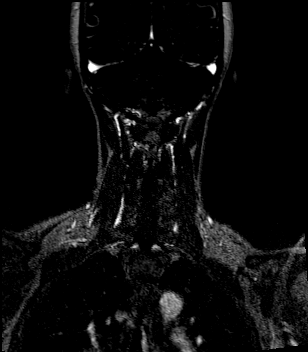
[im 80/80]
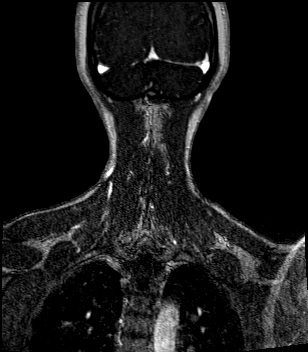

[Series 17: angio_fl3d_cor_post_ttc=3.0s_moco-adv · coronal · 0.9mm · 0.85mm/px · 6 of 80 slices shown (2 of 2)]
[im 1/80]
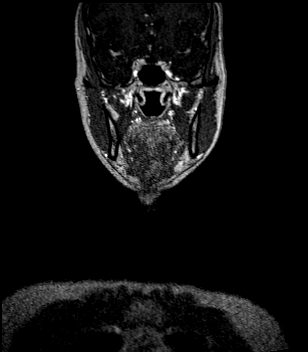
[im 16/80]
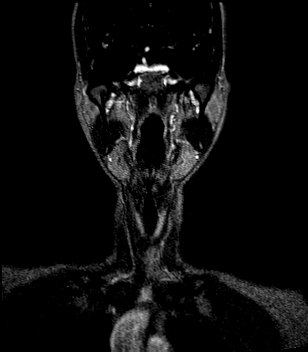
[im 32/80]
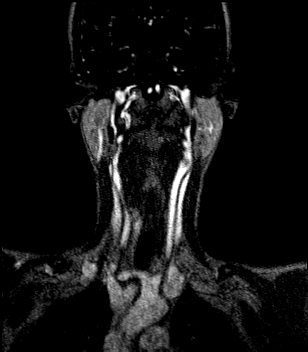
[im 48/80]
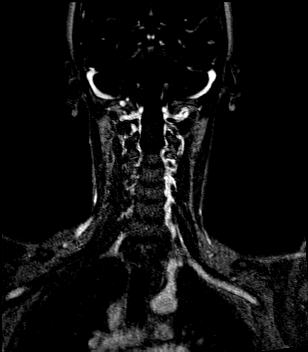
[im 64/80]
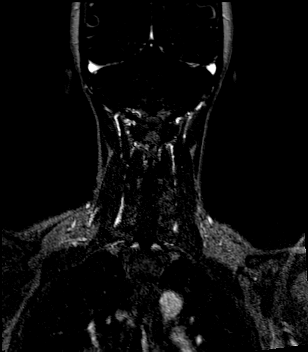
[im 80/80]
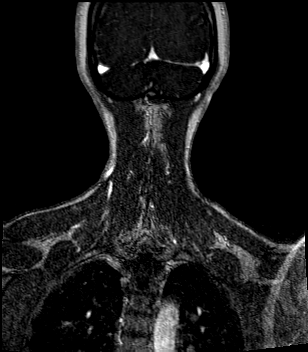

[42 of 48 positions shown; findings below may reference images not displayed]

FINDINGS: MRI HEAD FINDINGS

Brain: Diffusion imaging does not show any acute or subacute
infarction. The brainstem and cerebellum are normal. Cerebral
hemispheres are normal except for a few scattered punctate foci of
T2 and FLAIR signal in the frontal white matter, not likely
significant. No cortical or large vessel territory infarction. No
mass lesion, hemorrhage, hydrocephalus or extra-axial collection.

Vascular: Major vessels at the base of the brain show flow.

Skull and upper cervical spine: Negative

Sinuses/Orbits: Clear/normal

Other: None

MRA HEAD FINDINGS

Both internal carotid arteries are widely patent into the brain. No
siphon stenosis. The anterior and middle cerebral vessels are patent
without proximal stenosis, aneurysm or vascular malformation.

Both vertebral arteries are widely patent to the basilar. No basilar
stenosis. Posterior circulation branch vessels appear normal.

MRA NECK FINDINGS

Branching pattern of great vessels from the arch is normal. No
origin stenosis. Both common carotid arteries widely patent the
bifurcation. Both carotid bifurcations are normal. Both cervical
internal carotid arteries are normal.

Both vertebral artery origins are widely patent. Both vertebral
arteries appear normal through the neck to the foramen magnum.
IMPRESSION: Normal intracranial MR angiography. Normal MR angiography of the
neck vessels.

No acute brain finding. Few punctate foci of T2 and FLAIR signal
within the frontal white matter, not changed from the previous
study. These can be seen as incidental and insignificant findings or
that could represent the very earliest manifestation of small vessel
change.

## 2020-09-05 IMAGING — MR MR MRA HEAD W/O CM
1 series · 23 of 48 positions shown · IV contrast (gadavist)
Comparison: CT study same day.  MRI [DATE].

CLINICAL DATA: Right lower extremity deficits.

EXAM:
MRI HEAD WITHOUT  CONTRAST
MRA HEAD WITHOUT CONTRAST
MRA NECK WITHOUT AND WITH CONTRAST
TECHNIQUE: Multiplanar, multiecho pulse sequences of the brain and surrounding
structures were obtained without and with intravenous contrast.
Angiographic images of the Circle of Willis were obtained using MRA
technique without intravenous contrast. Angiographic images of the
neck were obtained using MRA technique without and with intravenous
contrast. Carotid stenosis measurements (when applicable) are
obtained utilizing NASCET criteria, using the distal internal
carotid diameter as the denominator.
CONTRAST:  5mL GADAVIST GADOBUTROL 1 MMOL/ML IV SOLN

[Series 1: 3d cow · axial · 0.5mm · 0.41mm/px · z∈[-100,-11]mm · 23 of 188 slices shown]
[im 1/188]
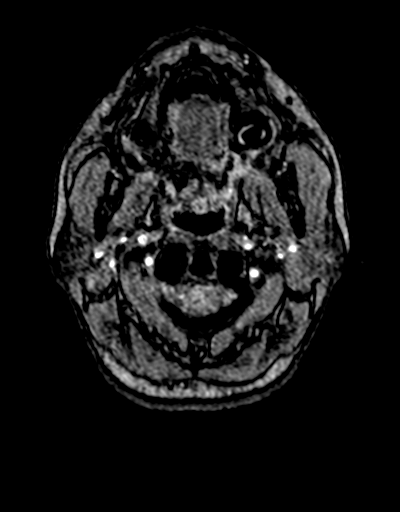
[im 4/188]
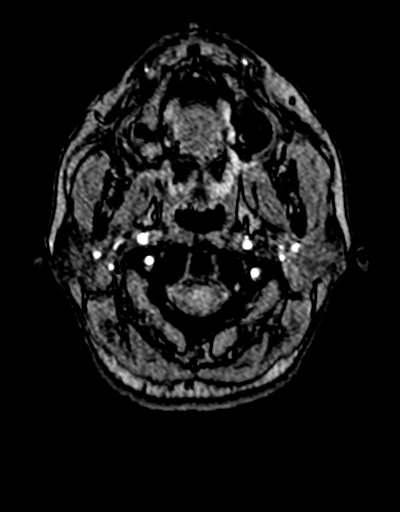
[im 8/188]
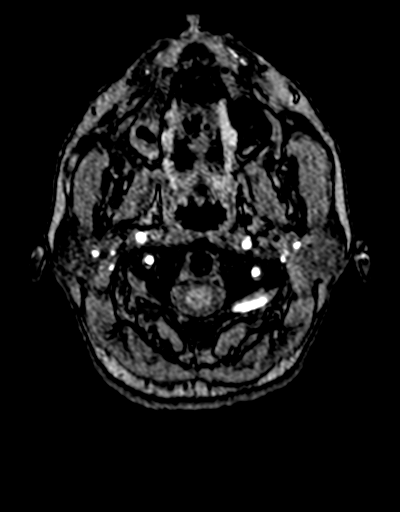
[im 12/188]
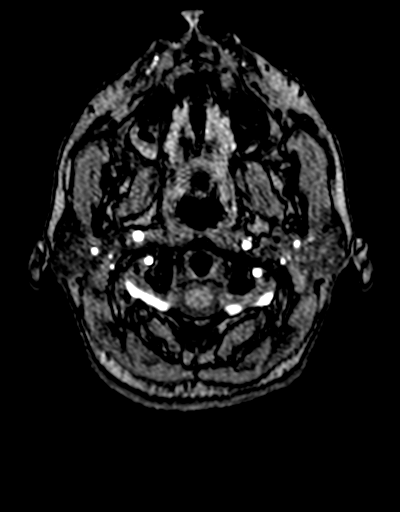
[im 16/188]
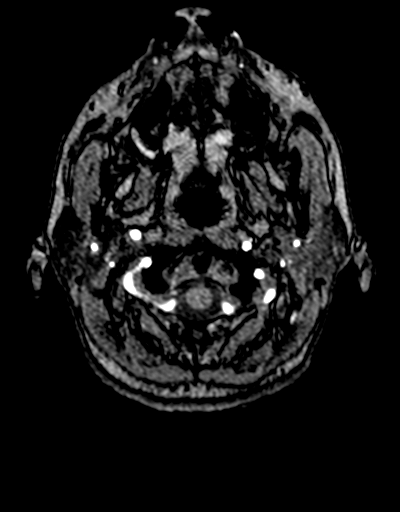
[im 20/188]
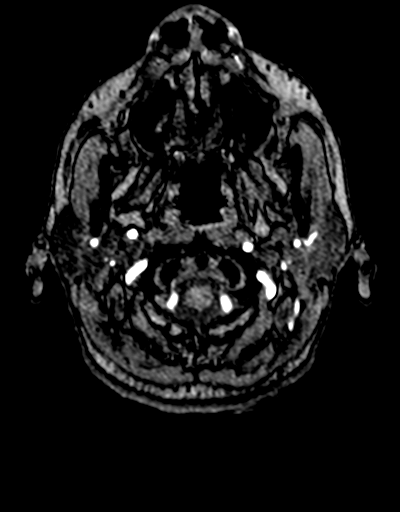
[im 24/188]
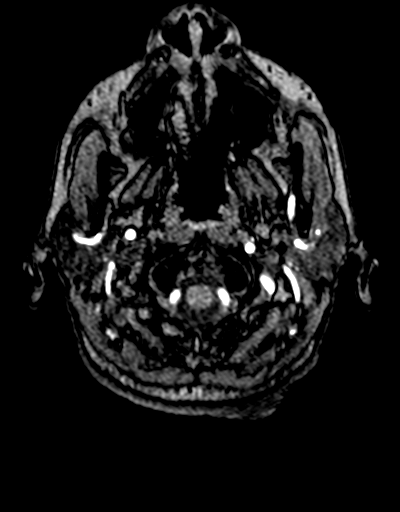
[im 28/188]
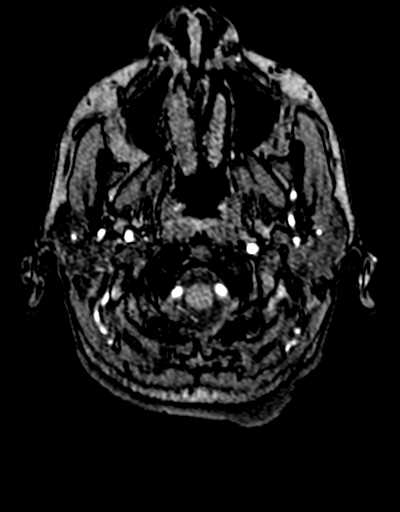
[im 32/188]
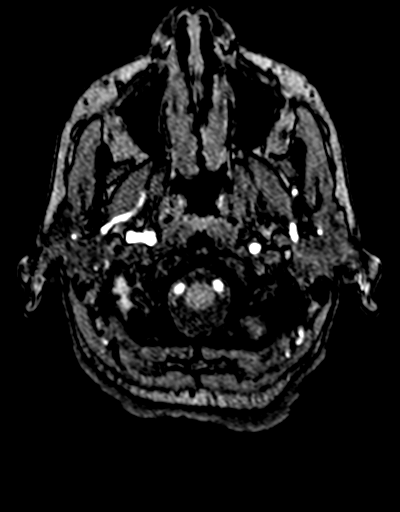
[im 36/188]
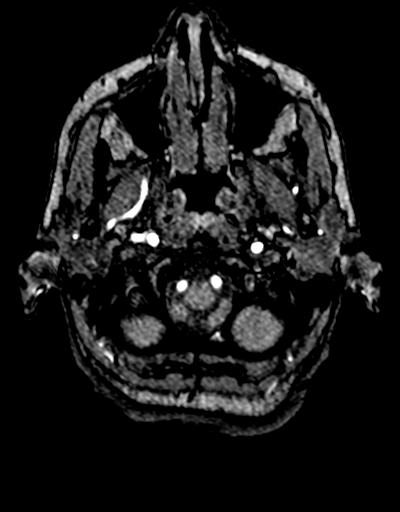
[im 40/188]
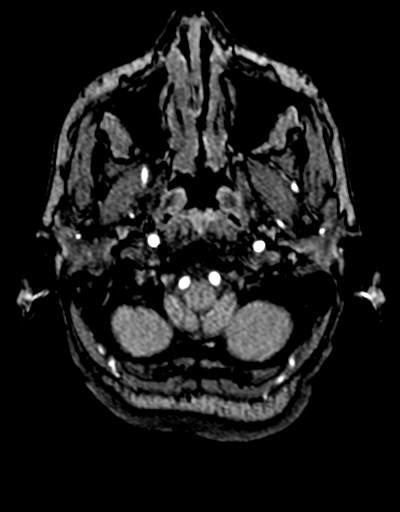
[im 44/188]
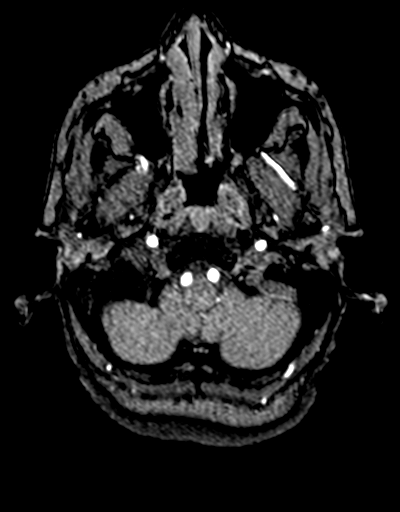
[im 48/188]
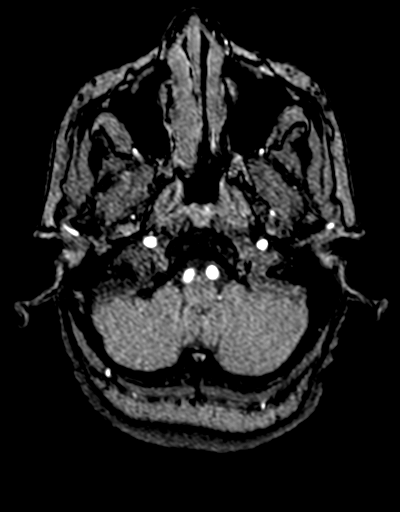
[im 52/188]
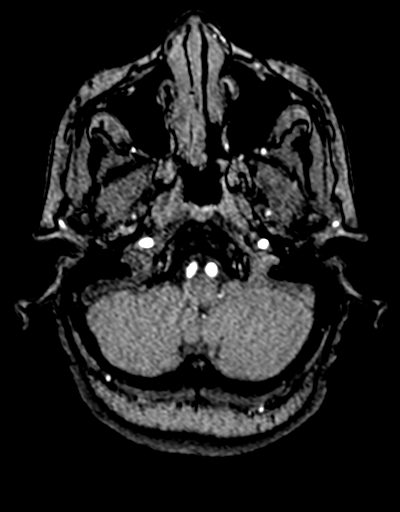
[im 56/188]
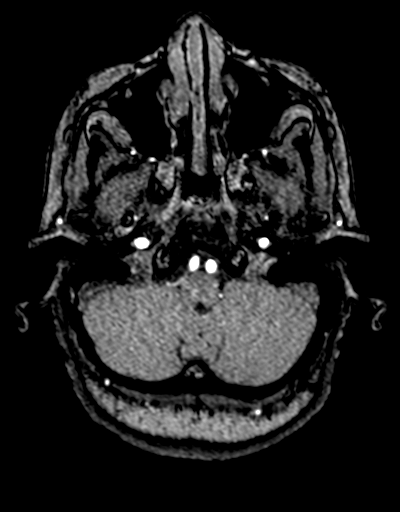
[im 60/188]
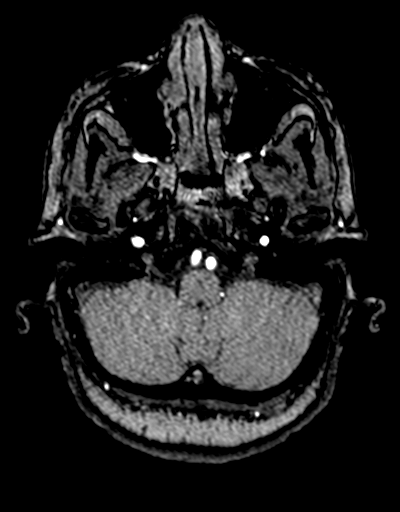
[im 84/188]
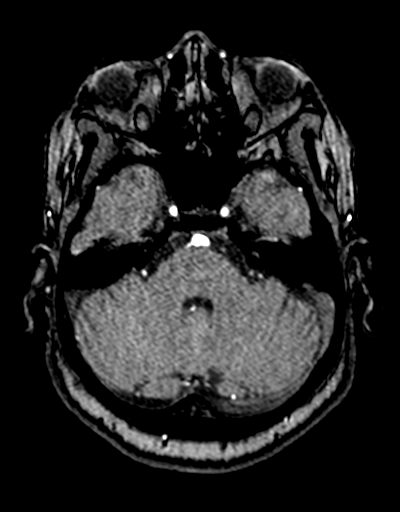
[im 96/188]
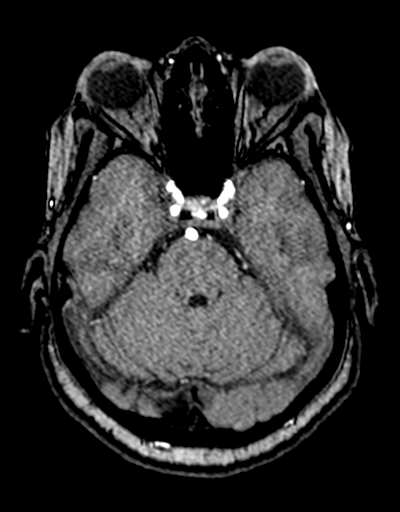
[im 108/188]
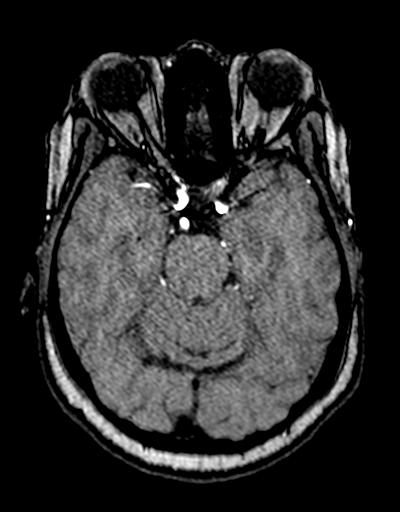
[im 132/188]
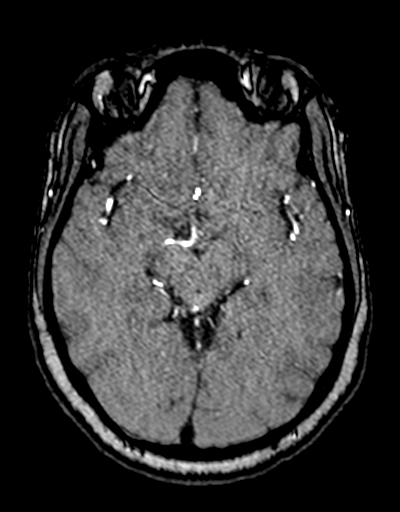
[im 156/188]
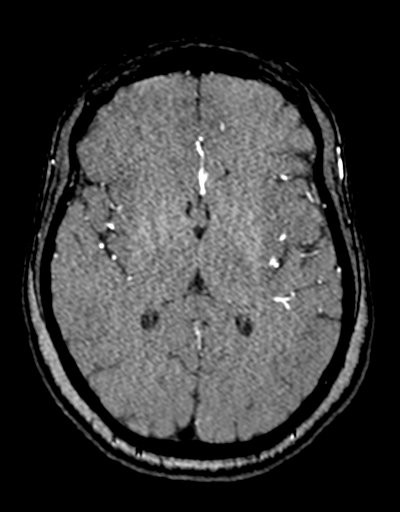
[im 160/188]
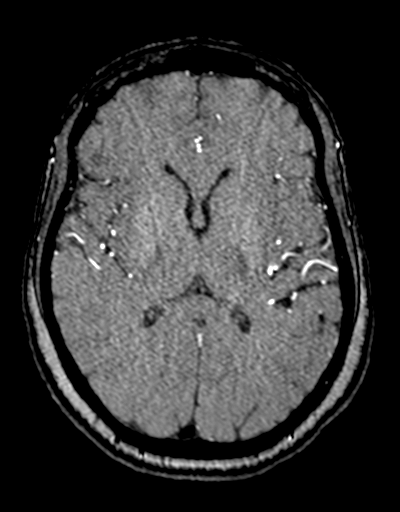
[im 180/188]
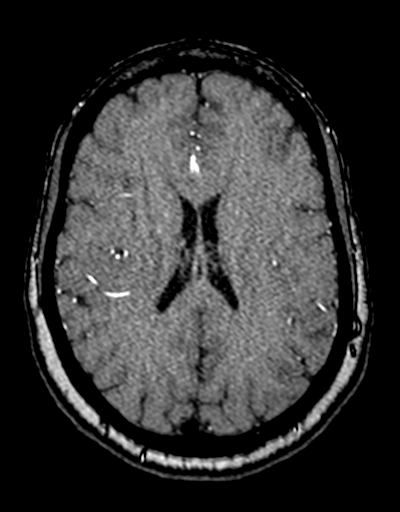

[23 of 48 positions shown; findings below may reference images not displayed]

FINDINGS: MRI HEAD FINDINGS

Brain: Diffusion imaging does not show any acute or subacute
infarction. The brainstem and cerebellum are normal. Cerebral
hemispheres are normal except for a few scattered punctate foci of
T2 and FLAIR signal in the frontal white matter, not likely
significant. No cortical or large vessel territory infarction. No
mass lesion, hemorrhage, hydrocephalus or extra-axial collection.

Vascular: Major vessels at the base of the brain show flow.

Skull and upper cervical spine: Negative

Sinuses/Orbits: Clear/normal

Other: None

MRA HEAD FINDINGS

Both internal carotid arteries are widely patent into the brain. No
siphon stenosis. The anterior and middle cerebral vessels are patent
without proximal stenosis, aneurysm or vascular malformation.

Both vertebral arteries are widely patent to the basilar. No basilar
stenosis. Posterior circulation branch vessels appear normal.

MRA NECK FINDINGS

Branching pattern of great vessels from the arch is normal. No
origin stenosis. Both common carotid arteries widely patent the
bifurcation. Both carotid bifurcations are normal. Both cervical
internal carotid arteries are normal.

Both vertebral artery origins are widely patent. Both vertebral
arteries appear normal through the neck to the foramen magnum.
IMPRESSION: Normal intracranial MR angiography. Normal MR angiography of the
neck vessels.

No acute brain finding. Few punctate foci of T2 and FLAIR signal
within the frontal white matter, not changed from the previous
study. These can be seen as incidental and insignificant findings or
that could represent the very earliest manifestation of small vessel
change.

## 2020-09-05 MED ORDER — LORAZEPAM 2 MG/ML IJ SOLN
1.0000 mg | Freq: Once | INTRAMUSCULAR | Status: AC
  Administered 2020-09-05: 1 mg via INTRAVENOUS
  Filled 2020-09-05: qty 1

## 2020-09-05 MED ORDER — GADOBUTROL 1 MMOL/ML IV SOLN
5.0000 mL | Freq: Once | INTRAVENOUS | Status: AC | PRN
  Administered 2020-09-05: 5 mL via INTRAVENOUS

## 2020-09-05 NOTE — ED Notes (Signed)
Patient transported to MRI 

## 2020-09-05 NOTE — ED Provider Notes (Signed)
[facility] EMERGENCY DEPARTMENT Provider Note   CSN: [id number] Arrival date & time: 09/05/20  0840  An emergency department physician performed an initial assessment on this suspected stroke patient at 0904.  History No chief complaint on file.   Leah Rose is a 50 y.o. female.  50yo F w/ PMH below including TIA, HTN, HLD, LVH who p/w right leg numbness.  Patient tells me that this morning while she was at work at 8 AM, she had a sudden onset of right leg tingling and numbness.  She kept working and then felt like she was dragging her leg.  She reports history of TIA.  She denies any other extremity weakness.  She states that she is compliant with medications at home although there is one medication that she needs refilled.  No recent illness.  The history is provided by the patient.       Past Medical History:  Diagnosis Date  . Chronic venous insufficiency 06/23/2017  . Essential hypertension 03/14/2006  . Hypertensive emergency 07/15/2020  . 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 Tobacco abuse 03/14/2006    Patient Active Problem List   Diagnosis Date Noted  . TIA (transient ischemic attack) 07/22/2020  . Diabetes (HCC) 07/22/2020  . Mild concentric left ventricular hypertrophy (LVH) 07/16/2020  . Hyperlipidemia 07/15/2020  . Hypokalemia 07/15/2020  . Chronic venous insufficiency 06/23/2017  . Healthcare maintenance 08/12/2016  . Tobacco abuse 03/14/2006  . 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 Essential hypertension 03/14/2006    History reviewed. No pertinent surgical history.   OB History   No obstetric history on file.     Family History  Problem Relation Age of Onset  . Seizures Mother   . Cirrhosis Mother   . Birth defects Sister   . Hypertension Sister   . Breast cancer Maternal Grandmother   . Thyroid disease Sister     Social History   Tobacco Use  . Smoking status: Current Every Day  Smoker    Packs/day: 1.00  . Smokeless tobacco: Never Used  . Tobacco comment: less sometimes.  Substance Use Topics  . Alcohol use: Yes    Alcohol/week: 14.0 standard drinks    Types: 14 Cans of beer per week  . Drug use: No    Home Medications Prior to Admission medications   Medication Sig Start Date End Date Taking? Authorizing Provider  aspirin 81 MG EC tablet Take 1 tablet (81 mg total) by mouth daily. 07/16/20 07/16/21 Yes Christian, Rylee, MD  hydrochlorothiazide (HYDRODIURIL) 12.5 MG tablet Take 1 tablet (12.5 mg total) by mouth daily. 08/18/20  Yes Earl Lagos, MD  rosuvastatin (CRESTOR) 20 MG tablet Take 1 tablet (20 mg total) by mouth daily. 08/18/20  Yes Earl Lagos, MD  olmesartan (BENICAR) 20 MG tablet Take 1 tablet (20 mg total) by mouth daily. 08/18/20 08/18/21  Earl Lagos, MD    Allergies    Penicillins and Sulfonamide derivatives  Review of Systems   Review of Systems All other systems reviewed and are negative except that which was mentioned in HPI  Physical Exam Updated Vital Signs BP (!) 134/97   Pulse (!) 104   Temp 98.3 F (36.8 C)   Resp (!) 25   Wt 56 kg   SpO2 100%   BMI 24.94 kg/m   Physical Exam Vitals and nursing note reviewed.  Constitutional:      General: She is not in acute distress.    Appearance: She  is well-developed.     Comments: Awake, alert  HENT:     Head: Normocephalic and atraumatic.  Eyes:     Extraocular Movements: Extraocular movements intact.     Conjunctiva/sclera: Conjunctivae normal.     Pupils: Pupils are equal, round, and reactive to light.  Cardiovascular:     Rate and Rhythm: Normal rate and regular rhythm.     Heart sounds: Normal heart sounds. No murmur heard.   Pulmonary:     Effort: Pulmonary effort is normal. No respiratory distress.     Breath sounds: Normal breath sounds.  Abdominal:     General: Bowel sounds are normal. There is no distension.     Palpations: Abdomen is soft.      Tenderness: There is no abdominal tenderness.  Musculoskeletal:     Cervical back: Neck supple.  Skin:    General: Skin is warm and dry.  Neurological:     Mental Status: She is alert and oriented to person, place, and time.     Cranial Nerves: No cranial nerve deficit.     Motor: No abnormal muscle tone.     Deep Tendon Reflexes: Reflexes are normal and symmetric.     Comments: Fluent speech, normal finger-to-nose testing, negative pronator drift, no clonus 5/5 strength BUE, LLE 4/5 strength RLE  Patient had difficulty understanding sensation testing, sensation appears grossly intact throughout  Psychiatric:     Comments: Anxious, bizarre affect     ED Results / Procedures / Treatments   Labs (all labs ordered are listed, but only abnormal results are displayed) Labs Reviewed  CBC - Abnormal; Notable for the following components:      Result Value   WBC 13.6 (*)    All other components within normal limits  DIFFERENTIAL - Abnormal; Notable for the following components:   Neutro Abs 9.7 (*)    Monocytes Absolute 1.1 (*)    Abs Immature Granulocytes 0.10 (*)    All other components within normal limits  COMPREHENSIVE METABOLIC PANEL - Abnormal; Notable for the following components:   CO2 21 (*)    Total Bilirubin 0.1 (*)    All other components within normal limits  I-STAT CHEM 8, ED - Abnormal; Notable for the following components:   Chloride 113 (*)    Calcium, Ion 1.03 (*)    TCO2 21 (*)    Hemoglobin 15.3 (*)    All other components within normal limits  RESP PANEL BY RT-PCR (FLU A&B, COVID) ARPGX2  ETHANOL  PROTIME-INR  APTT  RAPID URINE DRUG SCREEN, HOSP PERFORMED  URINALYSIS, ROUTINE W REFLEX MICROSCOPIC  I-STAT BETA HCG BLOOD, ED ([facility], [facility], [facility] ONLY)  CBG MONITORING, ED    EKG EKG Interpretation  Date/Time:  Saturday September 05 2020 10:02:23 EDT Ventricular Rate:  97 PR Interval:  120 QRS Duration: 72 QT Interval:  369 QTC Calculation: 469 R  Axis:   27 Text Interpretation: Sinus rhythm Probable left atrial enlargement Abnormal R-wave progression, early transition No significant change since last tracing Confirmed by Frederick Peers [phone]) on 09/05/2020 10:58:20 AM   Radiology MR ANGIO HEAD WO CONTRAST  Result Date: 09/05/2020 CLINICAL DATA:  Right lower extremity deficits. EXAM: MRI HEAD WITHOUT  CONTRAST MRA HEAD WITHOUT CONTRAST MRA NECK WITHOUT AND WITH CONTRAST TECHNIQUE: Multiplanar, multiecho pulse sequences of the brain and surrounding structures were obtained without and with intravenous contrast. Angiographic images of the Circle of Willis were obtained using MRA technique without intravenous contrast. Angiographic images of  the neck were obtained using MRA technique without and with intravenous contrast. Carotid stenosis measurements (when applicable) are obtained utilizing NASCET criteria, using the distal internal carotid diameter as the denominator. CONTRAST:  106mL GADAVIST GADOBUTROL 1 MMOL/ML IV SOLN COMPARISON:  CT study same day.  MRI 07/15/2020. FINDINGS: MRI HEAD FINDINGS Brain: Diffusion imaging does not show any acute or subacute infarction. The brainstem and cerebellum are normal. Cerebral hemispheres are normal except for a few scattered punctate foci of T2 and FLAIR signal in the frontal white matter, not likely significant. No cortical or large vessel territory infarction. No mass lesion, hemorrhage, hydrocephalus or extra-axial collection. Vascular: Major vessels at the base of the brain show flow. Skull and upper cervical spine: Negative Sinuses/Orbits: Clear/normal Other: None MRA HEAD FINDINGS Both internal carotid arteries are widely patent into the brain. No siphon stenosis. The anterior and middle cerebral vessels are patent without proximal stenosis, aneurysm or vascular malformation. Both vertebral arteries are widely patent to the basilar. No basilar stenosis. Posterior circulation branch vessels appear normal. MRA  NECK FINDINGS Branching pattern of great vessels from the arch is normal. No origin stenosis. Both common carotid arteries widely patent the bifurcation. Both carotid bifurcations are normal. Both cervical internal carotid arteries are normal. Both vertebral artery origins are widely patent. Both vertebral arteries appear normal through the neck to the foramen magnum. IMPRESSION: Normal intracranial MR angiography. Normal MR angiography of the neck vessels. No acute brain finding. Few punctate foci of T2 and FLAIR signal within the frontal white matter, not changed from the previous study. These can be seen as incidental and insignificant findings or that could represent the very earliest manifestation of small vessel change. Electronically Signed   By: Paulina Fusi M.D.   On: 09/05/2020 12:20   MR Angiogram Neck W or Wo Contrast  Result Date: 09/05/2020 CLINICAL DATA:  Right lower extremity deficits. EXAM: MRI HEAD WITHOUT  CONTRAST MRA HEAD WITHOUT CONTRAST MRA NECK WITHOUT AND WITH CONTRAST TECHNIQUE: Multiplanar, multiecho pulse sequences of the brain and surrounding structures were obtained without and with intravenous contrast. Angiographic images of the Circle of Willis were obtained using MRA technique without intravenous contrast. Angiographic images of the neck were obtained using MRA technique without and with intravenous contrast. Carotid stenosis measurements (when applicable) are obtained utilizing NASCET criteria, using the distal internal carotid diameter as the denominator. CONTRAST:  88mL GADAVIST GADOBUTROL 1 MMOL/ML IV SOLN COMPARISON:  CT study same day.  MRI 07/15/2020. FINDINGS: MRI HEAD FINDINGS Brain: Diffusion imaging does not show any acute or subacute infarction. The brainstem and cerebellum are normal. Cerebral hemispheres are normal except for a few scattered punctate foci of T2 and FLAIR signal in the frontal white matter, not likely significant. No cortical or large vessel territory  infarction. No mass lesion, hemorrhage, hydrocephalus or extra-axial collection. Vascular: Major vessels at the base of the brain show flow. Skull and upper cervical spine: Negative Sinuses/Orbits: Clear/normal Other: None MRA HEAD FINDINGS Both internal carotid arteries are widely patent into the brain. No siphon stenosis. The anterior and middle cerebral vessels are patent without proximal stenosis, aneurysm or vascular malformation. Both vertebral arteries are widely patent to the basilar. No basilar stenosis. Posterior circulation branch vessels appear normal. MRA NECK FINDINGS Branching pattern of great vessels from the arch is normal. No origin stenosis. Both common carotid arteries widely patent the bifurcation. Both carotid bifurcations are normal. Both cervical internal carotid arteries are normal. Both vertebral artery origins are widely patent.  Both vertebral arteries appear normal through the neck to the foramen magnum. IMPRESSION: Normal intracranial MR angiography. Normal MR angiography of the neck vessels. No acute brain finding. Few punctate foci of T2 and FLAIR signal within the frontal white matter, not changed from the previous study. These can be seen as incidental and insignificant findings or that could represent the very earliest manifestation of small vessel change. Electronically Signed   By: Paulina FusiMark  Shogry M.D.   On: 09/05/2020 12:20   MR Brain Wo Contrast (neuro protocol)  Result Date: 09/05/2020 CLINICAL DATA:  Right lower extremity deficits. EXAM: MRI HEAD WITHOUT  CONTRAST MRA HEAD WITHOUT CONTRAST MRA NECK WITHOUT AND WITH CONTRAST TECHNIQUE: Multiplanar, multiecho pulse sequences of the brain and surrounding structures were obtained without and with intravenous contrast. Angiographic images of the Circle of Willis were obtained using MRA technique without intravenous contrast. Angiographic images of the neck were obtained using MRA technique without and with intravenous contrast.  Carotid stenosis measurements (when applicable) are obtained utilizing NASCET criteria, using the distal internal carotid diameter as the denominator. CONTRAST:  5mL GADAVIST GADOBUTROL 1 MMOL/ML IV SOLN COMPARISON:  CT study same day.  MRI 07/15/2020. FINDINGS: MRI HEAD FINDINGS Brain: Diffusion imaging does not show any acute or subacute infarction. The brainstem and cerebellum are normal. Cerebral hemispheres are normal except for a few scattered punctate foci of T2 and FLAIR signal in the frontal white matter, not likely significant. No cortical or large vessel territory infarction. No mass lesion, hemorrhage, hydrocephalus or extra-axial collection. Vascular: Major vessels at the base of the brain show flow. Skull and upper cervical spine: Negative Sinuses/Orbits: Clear/normal Other: None MRA HEAD FINDINGS Both internal carotid arteries are widely patent into the brain. No siphon stenosis. The anterior and middle cerebral vessels are patent without proximal stenosis, aneurysm or vascular malformation. Both vertebral arteries are widely patent to the basilar. No basilar stenosis. Posterior circulation branch vessels appear normal. MRA NECK FINDINGS Branching pattern of great vessels from the arch is normal. No origin stenosis. Both common carotid arteries widely patent the bifurcation. Both carotid bifurcations are normal. Both cervical internal carotid arteries are normal. Both vertebral artery origins are widely patent. Both vertebral arteries appear normal through the neck to the foramen magnum. IMPRESSION: Normal intracranial MR angiography. Normal MR angiography of the neck vessels. No acute brain finding. Few punctate foci of T2 and FLAIR signal within the frontal white matter, not changed from the previous study. These can be seen as incidental and insignificant findings or that could represent the very earliest manifestation of small vessel change. Electronically Signed   By: Paulina FusiMark  Shogry M.D.   On:  09/05/2020 12:20   CT HEAD CODE STROKE WO CONTRAST  Result Date: 09/05/2020 CLINICAL DATA:  Code stroke. 50 year old female with right lower extremity deficits. EXAM: CT HEAD WITHOUT CONTRAST TECHNIQUE: Contiguous axial images were obtained from the base of the skull through the vertex without intravenous contrast. COMPARISON:  Brain MRI and head CT 07/15/2020 and earlier. FINDINGS: Brain: No midline shift, ventriculomegaly, mass effect, evidence of mass lesion, intracranial hemorrhage or evidence of cortically based acute infarction. Gray-white matter differentiation is within normal limits throughout the brain. Vascular: Mild Calcified atherosclerosis at the skull base. Tortuous basilar artery redemonstrated. No suspicious intracranial vascular hyperdensity. Skull: Negative. Sinuses/Orbits: Visualized paranasal sinuses and mastoids are stable and well aerated. Other: Stable, negative orbits and scalp. ASPECTS [facility]([place] Stroke Program Early CT Score) Total score (0-10 with 10 being normal): 10 IMPRESSION: 1.  Stable and normal noncontrast CT appearance of the brain, aside from tortuous basilar artery. ASPECTS 10. 2. These results were communicated to Dr. Tollie Eth at 9:30 amon 6/4/2022by text page via the [facility] messaging system. Electronically Signed   By: Odessa Fleming M.D.   On: 09/05/2020 09:31    Procedures Procedures   Medications Ordered in ED Medications  LORazepam (ATIVAN) injection 1 mg (1 mg Intravenous Given 09/05/20 1051)  gadobutrol (GADAVIST) 1 MMOL/ML injection 5 mL (5 mLs Intravenous Contrast Given 09/05/20 1155)    ED Course  I have reviewed the triage vital signs and the nursing notes.  Pertinent labs & imaging results that were available during my care of the patient were reviewed by me and considered in my medical decision making (see chart for details).    MDM Rules/Calculators/A&P                          Patient was alert, anxious on arrival, hypertensive at 159/111 initially.   She demonstrated right lower extremity weakness which she states started at 8 AM suddenly along with focal tingling/numbness.  Based on clear time of onset less than 4.5 hours, activated code stroke and patient immediately taken to CT scan and evaluated by neurology team.  She refused tPA after Dr. Tollie Eth discussion with her. He recommended MRI brain/ MRA H&N.  Screening lab work unremarkable.  MR imaging negative acute.  Neurology has recommended outpatient neurology referral for consideration of EMG and nerve conduction studies.  I have placed this referral and discussed plan with the patient.  She is alert and comfortable on reassessment.  I have extensively reviewed return precautions and she voiced understanding. Final Clinical Impression(s) / ED Diagnoses Final diagnoses:  Right leg numbness    Rx / DC Orders ED Discharge Orders         Ordered    Ambulatory referral to Neurology       Comments: An appointment is requested in approximately:2 weeks  EMG and nerve conduction study   09/05/20 1417           Duran Ohern, Ambrose [place], MD 09/05/20 1536

## 2020-09-05 NOTE — ED Notes (Addendum)
Daughter, Patterson Hammersmith [phone]), states she is going home to charge phone.

## 2020-09-05 NOTE — ED Triage Notes (Signed)
Patient arrived by [facility] complaining of not feeling right and complaining of intermittent right leg numbness since awakening this am. Patient states that she had trouble getting up this am due to the numbness. Moves all extremities on assessment

## 2020-09-05 NOTE — Consult Note (Signed)
NEUROLOGY CONSULTATION NOTE   Date of service: September 05, 2020 Patient Name: Leah Rose MRN:  [id number] DOB:  [birth year] Reason for consult: "Stroke code" Requesting Provider: Laurence Spates, MD _ _ _   _ __   _ __ _ _  __ __   _ __   __ _  History of Present Illness  Leah Rose is a 50 y.o. female with PMH significant for HTN, major depression, tobacco use, recent episode of R leg weakness thought to be TIA and workup non revealing and discharged on Dual antiplatlet for 21 days, then aspirin alone who presents with recurrence of R leg weakness.  She got up this morning and was feeling lethargic. Went to work and was in the break room before starting work. She reports feeling weird but unable to explain this any further. Reports feeling exhausted and tired. She reports dizziness when she got up. Felt her R leg was tingly and numb at first. She got up and felt like she was dragging her R leg. Felt the same as her episode back in April. 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 She was brought in by EMS.  She is a little overwhelmed on my initial evaluation. Not on any anticoagulation, no hx of strokes, no CNS malignancy, no CNS surgery or CNS infection. Reports that her R leg was working fine when she went to bed last night. She has made significant progress from April and was able to work and do everything like she was in the past.  Denies any leg pain, no saddle anesthesia, no numbness in her legs, no urinary or bowel incontinence, no constipation, no lhermittes sign. Does stand on her feet all day, feet sometimes swell up at the end of her shift. Endorses being diagnosed with a pinch nerve in her neck with some shoulder numbness. Mild off and on low back pain.  No prior history of seizures, mother was diagnosed with epilepsy. No significant head injury with LOC.  Endorses history of migraine but currently does not have a headache.  Endorses  depression, no access to guns, stopped medications 2/2 side effects a long time ago, denies any active or passive SI, never attempted suicide.  Smokes a pack every 2-3 days, drinks light beer about 1-2 24 oz cans in a week, no recreational substances.  LKW: 0800 on 09/05/20 MRS: 0 TPA: offered but she absolutely refused due to risks of bleeding. She feels that the risks outweigh any potential benefit. Thrombectomy: Not pursued due to low NIHSS NIHSS components Score: Comment  1a Level of Conscious 0[x]  1[]  2[]  3[]      1b LOC Questions 0[x]  1[]  2[]       1c LOC Commands 0[x]  1[]  2[]       2 Best Gaze 0[x]  1[]  2[]       3 Visual 0[x]  1[]  2[]  3[]      4 Facial Palsy 0[x]  1[]  2[]  3[]      5a Motor Arm - left 0[x]  1[]  2[]  3[]  4[]  UN[]    5b Motor Arm - Right 0[]  1[]  2[]  3[]  4[]  UN[]    6a Motor Leg - Left 0[x]  1[]  2[]  3[]  4[]  UN[]    6b Motor Leg - Right 0[]  1[x]  2[]  3[]  4[]  UN[]    7 Limb Ataxia 0[x]  1[]  2[]  3[]  UN[]     8 Sensory 0[x]  1[]  2[]  UN[]      9 Best Language 0[x]  1[]  2[]  3[]      10 Dysarthria 0[x]   1[]  2[]  UN[]      11 Extinct. and Inattention 0[x]  1[]  2[]       TOTAL: 1      ROS   Constitutional Denies weight loss, fever and chills.   HEENT Poor vision acuity but has not had her glasses changed in a lon time, no problems with hearing.  Respiratory Denies SOB and cough.  CV Denies palpitations and CP  GI Denies abdominal pain, nausea, vomiting and diarrhea.  GU Denies dysuria and urinary frequency.  MSK Denies myalgia and joint pain.  Skin Denies rash and pruritus.  Neurological Denies headache and syncope.  Psychiatric Denies recent changes in mood. Endorses anxiety and depression.   Past History   Past Medical History:  Diagnosis Date  . Chronic venous insufficiency 06/23/2017  . Essential hypertension 03/14/2006  . Hypertensive emergency 07/15/2020  . 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 Tobacco abuse 03/14/2006   History reviewed. No pertinent surgical  history. Family History  Problem Relation Age of Onset  . Seizures Mother   . Cirrhosis Mother   . Birth defects Sister   . Hypertension Sister   . Breast cancer Maternal Grandmother   . Thyroid disease Sister    Social History   Socioeconomic History  . Marital status: Single    Spouse name: Not on file  . Number of children: 1  . Years of education: 10512  . Highest education level: Not on file  Occupational History  . Occupation: [employer] Employee    Comment: [employer]  Tobacco Use  . Smoking status: Current Every Day Smoker    Packs/day: 1.00  . Smokeless tobacco: Never Used  . Tobacco comment: less sometimes.  Substance and Sexual Activity  . Alcohol use: Yes    Alcohol/week: 14.0 standard drinks    Types: 14 Cans of beer per week  . Drug use: No  . Sexual activity: Not on file  Other Topics Concern  . Not on file  Social History Narrative   Lives with her "old man" and a cat in [place]   Social Determinants of Health   Financial Resource Strain: Not on file  Food Insecurity: Not on file  Transportation Needs: Not on file  Physical Activity: Not on file  Stress: Not on file  Social Connections: Not on file   Allergies  Allergen Reactions  . Penicillins Itching  . Sulfonamide Derivatives Itching and Rash    Medications  (Not in a hospital admission)    Vitals   Vitals:   09/05/20 0849 09/05/20 0900  BP: (!) 159/111   Pulse: (!) 107   Resp: 14   Temp: 98.3 F (36.8 C)   SpO2: 100%   Weight:  56 kg     Body mass index is 24.94 kg/m.  Physical Exam   General: Laying comfortably in bed; in no acute distress. HENT: Normal oropharynx and mucosa. Normal external appearance of ears and nose. Neck: Supple, no pain or tenderness CV: No JVD. No peripheral edema. Tachycardic. Pulmonary: Symmetric Chest rise. Normal respiratory effort. Abdomen: Soft to touch, non-tender. Ext: No cyanosis, edema, or  deformity Skin: No rash. Normal palpation of skin.  Musculoskeletal: Normal digits and nails by inspection. No clubbing.  Neurologic Examination  Mental status/Cognition: Alert, oriented to self, place, month and year, good attention. Speech/language: Fluent, comprehension intact, object naming intact, repetition intact. Cranial nerves:   CN II Pupils equal and reactive to light, no VF deficits   CN  III,IV,VI EOM intact, no gaze preference or deviation, no nystagmus   CN V normal sensation in V1, V2, and V3 segments bilaterally   CN VII no asymmetry, no nasolabial fold flattening   CN VIII normal hearing to speech   CN IX & X normal palatal elevation, no uvular deviation   CN XI 5/5 head turn and 5/5 shoulder shrug bilaterally   CN XII midline tongue protrusion   Motor:  Muscle bulk: normal, tone normal, pronator drift none tremor none Mvmt Root Nerve  Muscle Right Left Comments  SA C5/6 Ax Deltoid 5 5   EF C5/6 Mc Biceps 5 5   EE C6/7/8 Rad Triceps 5 5   WF C6/7 Med FCR     WE C7/8 PIN ECU     F Ab C8/T1 U ADM/FDI 5 5   HF L1/2/3 Fem Illopsoas 4+ 5   KE L2/3/4 Fem Quad 5 5   DF L4/5 D Peron Tib Ant 5 5   PF S1/2 Tibial Grc/Sol 4+ 5    Reflexes:  Right Left Comments  Pectoralis      Biceps (C5/6) 1 1   Brachioradialis (C5/6) 2 2    Triceps (C6/7) 1 1    Patellar (L3/4) 1 1    Achilles (S1)      Hoffman      Plantar down down   Jaw jerk    Sensation:  Light touch Intact throughout   Pin prick    Temperature    Vibration   Proprioception    Coordination/Complex Motor:  - Finger to Nose intact BL - Heel to shin intact BL - Rapid alternating movement normal - Gait: Deferred.  Labs   CBC:  Recent Labs  Lab 09/05/20 0919  HGB 15.3*  HCT 45.0    Basic Metabolic Panel:  Lab Results  Component Value Date   NA 142 09/05/2020   K 4.0 09/05/2020   CO2 21 07/22/2020   GLUCOSE 82 09/05/2020   BUN 13 09/05/2020   CREATININE 0.60 09/05/2020   CALCIUM 9.5  07/22/2020   GFRNONAA >60 07/16/2020   GFRAA 120 02/19/2019   Lipid Panel:  Lab Results  Component Value Date   LDLCALC 139 (H) 07/15/2020   HgbA1c:  Lab Results  Component Value Date   HGBA1C 6.7 (H) 07/15/2020   Urine Drug Screen:     Component Value Date/Time   LABOPIA NONE DETECTED 07/16/2020 1437   COCAINSCRNUR NONE DETECTED 07/16/2020 1437   COCAINSCRNUR NEG 07/13/2006 2104   LABBENZ NONE DETECTED 07/16/2020 1437   LABBENZ NEG 07/13/2006 2104   AMPHETMU NONE DETECTED 07/16/2020 1437   THCU NONE DETECTED 07/16/2020 1437   LABBARB NONE DETECTED 07/16/2020 1437    Alcohol Level No results found for: ETH  CT Head without contrast: Personally reviewed and CTH was negative for a large hypodensity concerning for a large territory infarct or hyperdensity concerning for an ICH  MR Angio head and neck without contrast: pending  MRI Brain: Pending.  Impression   Leah Rose is a 50 y.o. female with PMH significant for HTN, major depression, tobacco use, recent episode of R leg weakness thought to be TIA and workup non revealing and discharged on Dual antiplatlet for 21 days, then aspirin alone who presents with recurrence of R leg weakness. Her neurologic examination is notable for mild R hip flexion weakness. Declined tPA, thrombectomy workup not pursued due to low NIHSS.  Differential is broad including radiculopathy but she denies any pain or  sensory symptoms, no concern for cord compression given absence of classic features and no hyperreflexia. Might be a TIA vs functional given there did appear to be some give away component to her presentation.  She just recently had full stroke workup with vessel imaging, TTE with no significant abnormality. She was started on statin for elevated LDL and her HbA1C was also elevated.  Counseled her on the importance of quitting smoking to reduce risk of strokes in the future.  Recommendations  - Will get an MRI brain and MR Angio  head and neck without contrast to begin with. If negative, no further inpatient neurologic workup and have her follow up with neurology outpatient for EMG with NCS for further evaluation of radiculopathy. - Recommend PT and OT eval. - Follow up with PCP for close monitoring and management of Hypertension, diabetes, further assistance with quitting smoking.   ______________________________________________________________________   Thank you for the opportunity to take part in the care of this patient. If you have any further questions, please contact the neurology consultation attending.  Signed,  Erick Blinks [facility] Pager Number [phone] _ _ _   _ __   _ __ _ _  __ __   _ __   __ _

## 2020-09-05 NOTE — ED Notes (Signed)
Patient returned from MRI. Telemetry leads, BP cuff, pulse ox reapplied. Patient states her anxiety is much improved since Ativan administration. When asked if her right leg feels better, patient replies "yes," however, will not lift leg to participate in modified NIH assessment. Patient resting comfortably on stretcher, daughter at bedside.

## 2020-09-05 NOTE — ED Notes (Signed)
[facility] called to activate code stroke 

## 2020-09-09 ENCOUNTER — Telehealth: Payer: Self-pay | Admitting: *Deleted

## 2020-09-09 NOTE — Telephone Encounter (Signed)
Patient transferred to Triage. States she is having memory issues and would like to be seen before scheduled appt on 7/19 with PCP. First available appt given with [facility] on 6/13 at 1015. Reviewed med list and patient taking all meds as prescribed. Patient also has new patient appt with [facility] on 6/28.

## 2020-09-09 NOTE — Telephone Encounter (Signed)
Thank you. I agree 

## 2020-09-14 ENCOUNTER — Ambulatory Visit: Payer: 59 | Admitting: Internal Medicine

## 2020-09-14 ENCOUNTER — Other Ambulatory Visit: Payer: Self-pay

## 2020-09-14 ENCOUNTER — Encounter: Payer: Self-pay | Admitting: Internal Medicine

## 2020-09-14 VITALS — BP 135/91 | HR 87 | Temp 98.8°F | Ht 59.0 in | Wt 95.6 lb

## 2020-09-14 DIAGNOSIS — G459 Transient cerebral ischemic attack, unspecified: Secondary | ICD-10-CM

## 2020-09-14 DIAGNOSIS — R413 Other amnesia: Secondary | ICD-10-CM

## 2020-09-14 DIAGNOSIS — F331 Major depressive disorder, recurrent, moderate: Secondary | ICD-10-CM

## 2020-09-14 DIAGNOSIS — F411 Generalized anxiety disorder: Secondary | ICD-10-CM

## 2020-09-14 MED ORDER — SERTRALINE HCL 25 MG PO TABS: 25.0000 mg | ORAL_TABLET | Freq: Every day | ORAL | 0 refills | Status: DC

## 2020-09-14 NOTE — Progress Notes (Signed)
   CC: memory issues, anxiety  HPI:  [name] is a 50 y.o. with a past medical history listed below presenting for evaluation of her memory loss and anxiety. For details of today's visit and the status of his chronic medical issues please refer to the assessment and plan.   Past Medical History:  Diagnosis Date   Chronic venous insufficiency 06/23/2017   Essential hypertension 03/14/2006   Hypertensive emergency 07/15/2020   Moderate recurrent major depression (HCC) 03/14/2006   Tobacco abuse 03/14/2006   Review of Systems:   Review of Systems  Constitutional:  Positive for malaise/fatigue. Negative for chills, fever and weight loss.  Neurological:  Positive for tingling and sensory change. Negative for dizziness, weakness and headaches.  Psychiatric/Behavioral:  Positive for depression and memory loss. The patient is nervous/anxious. The patient does not have insomnia.      Physical Exam:  Vitals:   09/14/20 1010  BP: (!) 135/91  Pulse: 87  Temp: 98.8 F (37.1 C)  TempSrc: Oral  SpO2: 100%  Weight: 95 lb 9.6 oz (43.4 kg)  Height: 4\' 11"  (1.499 m)    Physical Exam Constitutional:      General: She is not in acute distress.    Appearance: Normal appearance. She is not ill-appearing.  Cardiovascular:     Rate and Rhythm: Normal rate and regular rhythm.     Pulses: Normal pulses.     Heart sounds: Normal heart sounds. No murmur heard.   No friction rub. No gallop.  Pulmonary:     Effort: Pulmonary effort is normal. No respiratory distress.     Breath sounds: Normal breath sounds. No wheezing or rales.  Abdominal:     General: Abdomen is flat. Bowel sounds are normal. There is no distension.     Palpations: Abdomen is soft.     Tenderness: There is no abdominal tenderness.  Musculoskeletal:        General: No swelling or tenderness.     Right lower leg: No edema.     Left lower leg: No edema.  Skin:    General: Skin is warm and dry.  Neurological:      General: No focal deficit present.     Mental Status: She is alert and oriented to person, place, and time.  Psychiatric:        Mood and Affect: Mood normal.        Behavior: Behavior normal.        Thought Content: Thought content normal.        Judgment: Judgment normal.     Assessment & Plan:   See Encounters Tab for problem based charting.  Patient discussed with Dr. 

## 2020-09-14 NOTE — Assessment & Plan Note (Signed)
Patient continues to have depression and anxiety.  She states that her anxiety is her bigger concern.  Generalized in nature.  She states that she has racing thoughts throughout the day.  She also endorses fatigue and drowsiness throughout the day, which she attributes to her medications.  States that she started feeling tired after she was started on her blood pressure and cholesterol medications back in April.  Reassured her that these medications should not be causing such side effects.  She states that she has never been treated for her anxiety or depression.  She is interested in starting an SSRI today.  Plan: -Start Zoloft 25 mg daily - Follow-up in 4 weeks

## 2020-09-14 NOTE — Patient Instructions (Addendum)
Ms. Spada,   It was a pleasure seeing you today. Today we discussed the following:   Memory issues-I want to check some blood work today to make sure there is nothing reversible we can treat to help your memory.  Anxiety- For your anxiety and depression I am sending in a script for Zoloft 25 mg daily. I will follow up with you in 4 weeks to see how you are doing on this new medication.    Please call the internal medicine center clinic if you have any questions or concerns, we may be able to help and keep you from a long and expensive emergency room wait. Our clinic and after hours phone number is [phone], the best time to call is Monday through Friday 9 am to 4 pm but there is always someone available 24/7 if you have an emergency. If you need medication refills please notify your pharmacy one week in advance and they will send [place] a request.   Thank you for allowing [place] to be a part of your care!

## 2020-09-14 NOTE — Assessment & Plan Note (Addendum)
Patient reports memory loss since last Saturday.  She presented to the ED on 6/4 for right leg numbness and tingling and had a code stroke work-up.  Imaging was negative for any acute intracranial process.  She is unclear if they deemed this a TIA.  Per chart review it looks like patient declined administration of tPA.  She was discharged from the ED with plans to follow-up with neurology for nerve conduction studies at the end of this month.  She states that her short-term memory was initially affected last Saturday and has improved since however she is still having issues remembering things.  She denies any instances where she got lost or affected her ADLs.  However she is having difficult time remembering things like what they diagnosed her with in the ED last Saturday.  Is unable to tell me if they called this a mini stroke or not.  She is able to remember her medications.  [facility] assessment was 19 out of 30.  However, she was unable to participate in a lot of the assessment.  She just kept saying she could not do certain portions of the assessment or would give up trying.  Assessment/plan: Her memory loss could be affected from her previous TIA versus depression/anxiety versus an thyroid disease or vitamin B12 deficiency.  Will treat her anxiety and depression and also rule out underlying etiology.  -Check TSH and vitamin B12 -Start Zoloft 25 mg daily -Follow-up in 4 weeks

## 2020-09-15 LAB — TSH: TSH: 0.688 u[IU]/mL (ref 0.450–4.500)

## 2020-09-15 LAB — VITAMIN B12: Vitamin B-12: 436 pg/mL (ref 232–1245)

## 2020-09-15 NOTE — Progress Notes (Signed)
Internal Medicine Clinic Attending  Case discussed with Dr. Watson  At the time of the visit.  We reviewed the resident's history and exam and pertinent patient test results.  I agree with the assessment, diagnosis, and plan of care documented in the resident's note.  

## 2020-09-15 NOTE — Addendum Note (Signed)
Addended by: Burnell Blanks on: 09/15/2020 09:48 AM   Modules accepted: Level of Service

## 2020-09-29 ENCOUNTER — Ambulatory Visit: Payer: 59 | Admitting: Neurology

## 2020-09-29 ENCOUNTER — Telehealth: Payer: Self-pay | Admitting: Neurology

## 2020-09-29 ENCOUNTER — Encounter: Payer: Self-pay | Admitting: Neurology

## 2020-09-29 NOTE — Telephone Encounter (Signed)
Patient NO-SHOWED  new patient evaluation neurology.  She was referred for EMG nerve conduction study for limb pain.  If she calls back she can be rescheduled with any provider however please inform her that we have many patients awaiting and if she no-shows or cancels shortly before appointment she will be dismissed per office policy.(I will cc her pcp and referring doctor just fyi)

## 2020-10-07 ENCOUNTER — Other Ambulatory Visit: Payer: Self-pay

## 2020-10-07 MED ORDER — ROSUVASTATIN CALCIUM 20 MG PO TABS: 20.0000 mg | ORAL_TABLET | Freq: Every day | ORAL | 0 refills | Status: DC

## 2020-10-07 MED ORDER — HYDROCHLOROTHIAZIDE 12.5 MG PO TABS: 12.5000 mg | ORAL_TABLET | Freq: Every day | ORAL | 0 refills | Status: DC

## 2020-10-07 NOTE — Telephone Encounter (Signed)
RTC, VM obtained and message left to call triage nurse back.  Per chart review:  ASA sent on 07/16/20 #150 w/ 2 refills Benicar sent on 08/18/20 w/ note from pharmacy that patient has not picked up Zoloft sent 09/14/20 , 90 day supply (All sent to [facility] on [address])  HCTZ and Rosuvastatin refill request will be sent to MD for refills at same pharmacy. (NOV 10/2020)  SChaplin, RN,BSN

## 2020-10-07 NOTE — Telephone Encounter (Signed)
aspirin 81 MG EC tablet  hydrochlorothiazide (HYDRODIURIL) 12.5 MG tablet  olmesartan (BENICAR) 20 MG tablet  rosuvastatin (CRESTOR) 20 MG tablet  sertraline (ZOLOFT) 25 MG tablet, REFILL REQUEST. Pt do not know the name of the pharmacy she is using. Please call pt back.

## 2020-10-08 ENCOUNTER — Other Ambulatory Visit: Payer: Self-pay | Admitting: *Deleted

## 2020-10-08 MED ORDER — OLMESARTAN MEDOXOMIL 20 MG PO TABS: 20.0000 mg | ORAL_TABLET | Freq: Every day | ORAL | 3 refills | Status: DC

## 2020-10-08 MED ORDER — ASPIRIN 81 MG PO TBEC: 81.0000 mg | DELAYED_RELEASE_TABLET | Freq: Every day | ORAL | 3 refills | Status: AC

## 2020-10-20 ENCOUNTER — Encounter: Payer: 59 | Admitting: Internal Medicine

## 2020-11-12 ENCOUNTER — Other Ambulatory Visit: Payer: Self-pay | Admitting: Internal Medicine

## 2020-11-12 MED ORDER — HYDROCHLOROTHIAZIDE 12.5 MG PO TABS: 12.5000 mg | ORAL_TABLET | Freq: Every day | ORAL | 0 refills | Status: DC

## 2020-11-12 MED ORDER — ROSUVASTATIN CALCIUM 20 MG PO TABS: 20.0000 mg | ORAL_TABLET | Freq: Every day | ORAL | 0 refills | Status: DC

## 2020-11-12 NOTE — Telephone Encounter (Signed)
Pt already has 90 day supply on Benicar and Zoloft.

## 2020-11-12 NOTE — Telephone Encounter (Signed)
Refill Request- Patient would like a call back about receiving a 3 month supply rather than a monthly supply of the following medications:   hydrochlorothiazide (HYDRODIURIL) 12.5 MG tablet olmesartan (BENICAR) 20 MG tablet rosuvastatin (CRESTOR) 20 MG tablet sertraline (ZOLOFT) 25 MG tablet   [id number] - [place], [place] - [address] (Ph: [phone])

## 2020-12-08 ENCOUNTER — Ambulatory Visit (HOSPITAL_COMMUNITY)
Admission: EM | Admit: 2020-12-08 | Discharge: 2020-12-08 | Disposition: A | Discharge status: Home / Self Care | Payer: 59 | Attending: Student | Admitting: Student

## 2020-12-08 ENCOUNTER — Other Ambulatory Visit: Payer: Self-pay

## 2020-12-08 ENCOUNTER — Encounter (HOSPITAL_COMMUNITY): Payer: Self-pay

## 2020-12-08 DIAGNOSIS — B9689 Other specified bacterial agents as the cause of diseases classified elsewhere: Secondary | ICD-10-CM

## 2020-12-08 DIAGNOSIS — H109 Unspecified conjunctivitis: Secondary | ICD-10-CM

## 2020-12-08 MED ORDER — CIPROFLOXACIN HCL 0.3 % OP SOLN: 1.0000 [drp] | OPHTHALMIC | 0 refills | Status: AC

## 2020-12-08 NOTE — ED Provider Notes (Signed)
[facility]    CSN: [id number] Arrival date & time: 12/08/20  8127      History   Chief Complaint Chief Complaint  Patient presents with   eye redness    HPI Leah Rose is a 50 y.o. female presenting with R eye redness and irritation x4 days, now also with L eye irritation x1 day. Medical history noncontributory. Visine providing minimal relief. Crusting and excess watering burning and itching. Some blurred vision when eyes watering.  Denies photophobia, foreign body sensation, eye pain, eye pain with movement, injury to eye, vision changes, double vision, recent URI. Wears glasses not contacts.    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 No pertinent surgical history.  OB History   No obstetric history on file.      Home Medications    Prior to Admission medications   Medication Sig Start Date End Date Taking? Authorizing Provider  ciprofloxacin (CILOXAN) 0.3 % ophthalmic solution Place 1 drop into both eyes every 4 (four) hours while awake for 7 days. 1 drop, every 4 hours, while awake, for the next 7 days. 12/08/20 12/15/20 Yes Rhys Martini, PA-C  aspirin 81 MG EC tablet Take 1 tablet (81 mg total) by mouth daily. 10/08/20 10/08/21  Inez Catalina, MD  hydrochlorothiazide  (HYDRODIURIL) 12.5 MG tablet Take 1 tablet (12.5 mg total) by mouth daily. 11/12/20   Inez Catalina, MD  olmesartan (BENICAR) 20 MG tablet Take 1 tablet (20 mg total) by mouth daily. 10/08/20 10/08/21  Inez Catalina, MD  rosuvastatin (CRESTOR) 20 MG tablet Take 1 tablet (20 mg total) by mouth daily. 11/12/20   Inez Catalina, MD  sertraline (ZOLOFT) 25 MG tablet Take 1 tablet (25 mg total) by mouth daily. 09/14/20 12/13/20  Rehman, Callie Fielding, DO    Family History Family History  Problem Relation Age of Onset   Seizures Mother    Cirrhosis Mother    Birth defects Sister    Hypertension Sister    Breast cancer Maternal Grandmother    Thyroid disease Sister     Social History Social History   Tobacco Use   Smoking status: Every Day    Packs/day: 0.50    Types: Cigarettes   Smokeless tobacco: Never   Tobacco comments:    less sometimes.   Vaping Use   Vaping Use: Never used  Substance Use Topics   Alcohol use: Yes    Alcohol/week: 14.0 standard drinks    Types: 14 Cans of beer per week   Drug use: No     Allergies   Penicillins and Sulfonamide derivatives   Review of Systems Review of Systems  Eyes:  Positive for discharge, redness and itching. Negative for photophobia, pain and visual disturbance.  All other systems reviewed and are negative.   Physical Exam Triage Vital Signs ED Triage  Vitals  Enc Vitals Group     BP 12/08/20 0854 128/90     Pulse Rate 12/08/20 0854 96     Resp 12/08/20 0854 18     Temp 12/08/20 0854 98.9 F (37.2 C)     Temp Source 12/08/20 0854 Oral     SpO2 12/08/20 0854 98 %     Weight --      Height --      Head Circumference --      Peak Flow --      Pain Score 12/08/20 0851 0     Pain Loc --      Pain Edu? --      Excl. in GC? --    No data found.  Updated Vital Signs BP 128/90 (BP Location: Left Arm)   Pulse 96   Temp 98.9 F (37.2 C) (Oral)   Resp 18   SpO2 98%   Visual Acuity Right Eye Distance: 20/30 Left Eye  Distance: 20/40 Bilateral Distance: 20/40  Right Eye Near:   Left Eye Near:    Bilateral Near:     Physical Exam Vitals reviewed.  Constitutional:      Appearance: Normal appearance.  HENT:     Head: Normocephalic and atraumatic.     Right Ear: Tympanic membrane, ear canal and external ear normal. There is no impacted cerumen.     Left Ear: Tympanic membrane, ear canal and external ear normal. There is no impacted cerumen.     Nose: Nose normal. No congestion.     Mouth/Throat:     Pharynx: Oropharynx is clear. No posterior oropharyngeal erythema.  Eyes:     General: Lids are normal. Lids are everted, no foreign bodies appreciated. Vision grossly intact. Gaze aligned appropriately. No visual field deficit.       Right eye: Discharge present. No foreign body or hordeolum.        Left eye: No foreign body, discharge or hordeolum.     Extraocular Movements: Extraocular movements intact.     Right eye: Normal extraocular motion and no nystagmus.     Left eye: Normal extraocular motion and no nystagmus.     Conjunctiva/sclera:     Right eye: Right conjunctiva is injected. No chemosis, exudate or hemorrhage.    Left eye: Left conjunctiva is injected. No chemosis, exudate or hemorrhage.    Pupils: Pupils are equal, round, and reactive to light.     Visual Fields: Right eye visual fields normal and left eye visual fields normal.     Comments: R conjunctival injection, with scant discharge and watering. Minimal L conjunctival injection. PERRLA, EOMI without pain. No orbital tenderness. Visual acuity intact.   Cardiovascular:     Rate and Rhythm: Normal rate and regular rhythm.     Heart sounds: Normal heart sounds.  Pulmonary:     Effort: Pulmonary effort is normal.     Breath sounds: Normal breath sounds.  Neurological:     General: No focal deficit present.     Mental Status: She is alert.  Psychiatric:        Mood and Affect: Mood normal.        Behavior: Behavior normal.         Thought Content: Thought content normal.        Judgment: Judgment normal.     UC Treatments / Results  Labs (all labs ordered are listed, but only abnormal results are displayed) Labs Reviewed - No data to display  EKG   Radiology No results found.  Procedures Procedures (including critical care time)  Medications Ordered in UC Medications - No data to display  Initial Impression / Assessment and Plan / UC Course  I have reviewed the triage vital signs and the nursing notes.  Pertinent labs & imaging results that were available during my care of the patient were reviewed by me and considered in my medical decision making (see chart for details).     This patient is a very pleasant 50 y.o. year old female presenting with bacterial conjunctivitis. Visual acuity intact.   She does not wear contact lenses. She is penicillin and sulfa allergic. Will treat with ciprofloxacin drops as below.   ED return precautions discussed. Patient verbalizes understanding and agreement.    Final Clinical Impressions(s) / UC Diagnoses   Final diagnoses:  Bacterial conjunctivitis of both eyes     Discharge Instructions      -You have bacterial conjunctivitis (pinkeye). -Start the antibiotic drops, ciprofloxacin 1 drop into both eyes while awake every 4 hours for 7 days. -You can also try warm compresses or washing the face with gentle soap and water to remove crust in the morning. -Your symptoms should be a lot better in about 1 day, and you'll also no longer be contagious after 1 day on the antibiotic. -If your symptoms are getting worse instead of better, like pain, discharge, itching, vision changes-follow-up with your eye doctor as soon as possible. You could also follow-up with [place]. -Do not wear contact lenses until you have completed treatment (7 days!). Wear glasses only.      ED Prescriptions     Medication Sig Dispense Auth. Provider   ciprofloxacin (CILOXAN) 0.3 %  ophthalmic solution Place 1 drop into both eyes every 4 (four) hours while awake for 7 days. 1 drop, every 4 hours, while awake, for the next 7 days. 1.8 mL Rhys Martini, PA-C      PDMP not reviewed this encounter.   Rhys Martini, PA-C 12/08/20 1120

## 2020-12-08 NOTE — Discharge Instructions (Addendum)
-  You have bacterial conjunctivitis (pinkeye). -Start the antibiotic drops, ciprofloxacin 1 drop into both eyes while awake every 4 hours for 7 days. -You can also try warm compresses or washing the face with gentle soap and water to remove crust in the morning. -Your symptoms should be a lot better in about 1 day, and you'll also no longer be contagious after 1 day on the antibiotic. -If your symptoms are getting worse instead of better, like pain, discharge, itching, vision changes-follow-up with your eye doctor as soon as possible. You could also follow-up with [place]. -Do not wear contact lenses until you have completed treatment (7 days!). Wear glasses only.

## 2020-12-08 NOTE — ED Triage Notes (Signed)
Pt in with c/o right eye redness and irritation x 4 days  States she has used visine with no relief and her vision is slightly blurry

## 2021-05-28 ENCOUNTER — Other Ambulatory Visit: Payer: Self-pay | Admitting: Internal Medicine

## 2021-05-28 DIAGNOSIS — I10 Essential (primary) hypertension: Secondary | ICD-10-CM

## 2021-05-31 ENCOUNTER — Other Ambulatory Visit: Payer: Self-pay

## 2021-05-31 DIAGNOSIS — E782 Mixed hyperlipidemia: Secondary | ICD-10-CM

## 2021-05-31 MED ORDER — ROSUVASTATIN CALCIUM 20 MG PO TABS: 20.0000 mg | ORAL_TABLET | Freq: Every day | ORAL | 0 refills | Status: DC

## 2021-05-31 NOTE — Telephone Encounter (Signed)
Pt called to schedule an appt w/her PCP - no answer; left message to call the office.

## 2021-06-23 ENCOUNTER — Ambulatory Visit: Payer: 59 | Admitting: Internal Medicine

## 2021-06-23 ENCOUNTER — Encounter: Payer: Self-pay | Admitting: Internal Medicine

## 2021-06-23 ENCOUNTER — Other Ambulatory Visit: Payer: Self-pay

## 2021-06-23 VITALS — BP 164/101 | HR 83 | Temp 98.8°F | Ht 59.0 in | Wt 101.4 lb

## 2021-06-23 DIAGNOSIS — Z720 Tobacco use: Secondary | ICD-10-CM | POA: Diagnosis not present

## 2021-06-23 DIAGNOSIS — F411 Generalized anxiety disorder: Secondary | ICD-10-CM

## 2021-06-23 DIAGNOSIS — H9192 Unspecified hearing loss, left ear: Secondary | ICD-10-CM

## 2021-06-23 DIAGNOSIS — F331 Major depressive disorder, recurrent, moderate: Secondary | ICD-10-CM

## 2021-06-23 DIAGNOSIS — E119 Type 2 diabetes mellitus without complications: Secondary | ICD-10-CM

## 2021-06-23 DIAGNOSIS — G459 Transient cerebral ischemic attack, unspecified: Secondary | ICD-10-CM

## 2021-06-23 DIAGNOSIS — E782 Mixed hyperlipidemia: Secondary | ICD-10-CM

## 2021-06-23 DIAGNOSIS — M79641 Pain in right hand: Secondary | ICD-10-CM

## 2021-06-23 DIAGNOSIS — Z0000 Encounter for general adult medical examination without abnormal findings: Secondary | ICD-10-CM

## 2021-06-23 DIAGNOSIS — I10 Essential (primary) hypertension: Secondary | ICD-10-CM

## 2021-06-23 DIAGNOSIS — M79642 Pain in left hand: Secondary | ICD-10-CM

## 2021-06-23 DIAGNOSIS — R630 Anorexia: Secondary | ICD-10-CM

## 2021-06-23 DIAGNOSIS — Z23 Encounter for immunization: Secondary | ICD-10-CM | POA: Diagnosis not present

## 2021-06-23 LAB — GLUCOSE, CAPILLARY: Glucose-Capillary: 88 mg/dL (ref 70–99)

## 2021-06-23 LAB — POCT GLYCOSYLATED HEMOGLOBIN (HGB A1C): Hemoglobin A1C: 5.7 % — AB (ref 4.0–5.6)

## 2021-06-23 MED ORDER — SERTRALINE HCL 25 MG PO TABS: 25.0000 mg | ORAL_TABLET | Freq: Every day | ORAL | 0 refills | Status: DC

## 2021-06-23 NOTE — Assessment & Plan Note (Signed)
-   This problem is chronic and stable ?-Patient is compliant with rosuvastatin 20 mg daily ?-We will recheck a lipid profile today ?

## 2021-06-23 NOTE — Assessment & Plan Note (Signed)
Lab Results  ?Component Value Date  ? HGBA1C 5.7 (A) 06/23/2021  ? HGBA1C 6.7 (H) 07/15/2020  ?  ? ?Assessment: ?Diabetes control:  Well-controlled ?Progress toward A1C goal:   At goal ?Comments: Patient is not on diabetic medications at this time ? ?Plan: ?Medications:  Not on diabetic medications currently ?Home glucose monitoring: ?Frequency:   ?Timing:   ?Instruction/counseling given: discussed foot care ?Educational resources provided:   ?Product manager tools provided:   ?Other plans: We will check BMP today.  We will obtain eye exam results from her ophthalmologist ? ? ?

## 2021-06-23 NOTE — Assessment & Plan Note (Signed)
BP Readings from Last 3 Encounters:  ?06/23/21 (!) 164/101  ?12/08/20 128/90  ?09/14/20 (!) 135/91  ? ? ?Lab Results  ?Component Value Date  ? NA 142 09/05/2020  ? K 4.0 09/05/2020  ? CREATININE 0.60 09/05/2020  ? ? ?Assessment: ?Blood pressure control:  Uncontrolled ?Progress toward BP goal:   Deteriorated ?Comments: Patient states that she is compliant with HCTZ 12.5 mg daily as well as olmesartan 20 mg daily ? ?Plan: ?Medications:  continue current medications ?Educational resources provided:   ?Product manager tools provided:   ?Other plans: We will attempt to control address her anxiety and bilateral hand pain and have her follow-up in 4 weeks and recheck her blood pressure.  We will check BMP today ?

## 2021-06-23 NOTE — Patient Instructions (Addendum)
-  It was a pleasure seeing you today ?-We will attempt to obtain your records.  Pap smear and mammogram from her gynecologist ?-We will check some blood work on you today including your cholesterol level, kidney function and thyroid level ?-I will refer you to sports medicine for your bilateral hand pain ?-We will give you a flu shot today ?-We will discuss referring you for colonoscopy at your next visit ?-I we will start you on sertraline today for your anxiety.  This medication takes about 2 to 3 weeks to start becoming effective.  Please continue this medication for at least 4 weeks.  Follow-up with me in 4 weeks so we can assess the efficacy of this medication ?-I will refer you to Dr. Sallyanne Kuster for anxiety is well ?-Your diabetes is well controlled.  Keep up the good work ?-Your blood pressure is still higher than normal.  We will follow-up with you in 4 weeks and if still elevated would consider changing your medications ?-Please call me if you have any questions or concerns or if you need any refills ?

## 2021-06-23 NOTE — Assessment & Plan Note (Signed)
-   Patient admitted hearing loss in her left ear and states that this is happened for secondary to wax buildup ?-On exam, was unable to visualize the tympanic membrane both ears ?-Will attempt to flush out her earwax in the clinic ?-No further work-up at this time ?

## 2021-06-23 NOTE — Assessment & Plan Note (Signed)
-   Patient complains of chronic bilateral hand pain which she states is worsening ?-She also complains of her fingers locking up and inability to make a fist ?-On exam, there is no swelling of any of her PIP or DIP joints or wrists ?-She is able to straighten her fingers but is unable to make a fist ?-I am uncertain of the etiology of her bilateral hand pain ?-We will check a TSH and refer patient to sports medicine  for follow-up ?

## 2021-06-23 NOTE — Assessment & Plan Note (Signed)
-   Patient was hospitalized for TIA in 2022 ?-She was supposed to follow-up with neurology but missed her appointment secondary to transportation issues ?-I spoke to our nurse tech who will attempt to arrange transportation for the patient to her appointments through her insurance ?-Referral to neurology placed again ?-We will continue with aspirin for now ?-No further work-up at this time ?

## 2021-06-23 NOTE — Assessment & Plan Note (Signed)
-   Flu shot given today ?-Patient states that she gets her mammogram and Pap smear with her gynecologist.  We will attempt obtain these records ?-Patient is also due for colonoscopy.  We will discuss this with her at her next visit ?-No further work-up at this time ?

## 2021-06-23 NOTE — Progress Notes (Signed)
? ?  Subjective:  ? ? Patient ID: Leah Rose, female    DOB: [birth year], 51 y.o.   MRN: [id number] ? ?Diabetes ? ?Hand Pain  ?Associated symptoms include numbness.  ? ?I have seen and examined this patient.  Patient is here for routine follow-up of her hypertension and diabetes. ? ?Patient does complain of worsening bilateral hand pain and difficulty making a fist.  Patient states that her fingers lock up and this involves all her fingers of both hands.  Patient also complains of decreased appetite and states that she does not eat much during the day.  She is also been having issues with transport and missed her last neurology appointment.  Patient also complains of decreased hearing in her left ear.  She denies any other complaints and states that she is compliant with her medications but is not taking Zoloft and does not remember being prescribed this medication. ? ?Review of Systems  ?Constitutional:  Positive for appetite change.  ?     Complains of longstanding decreased appetite  ?HENT:  Positive for hearing loss.   ?     Complains of left-sided hearing loss.  States that this is happened before secondary to wax buildup  ?Respiratory: Negative.    ?Cardiovascular: Negative.   ?Musculoskeletal:  Positive for arthralgias.  ?     Complains of pain in bilateral hands including palms as well as fingers and difficulty making a fist and also complains of fingers locking up  ?Skin: Negative.   ?Neurological:  Positive for numbness.  ?     Patient is complaining of numbness in bilateral hands but is unable to specifically narrow it down to a particular area in her hand  ?Psychiatric/Behavioral: Negative.    ? ?   ?Objective:  ? Physical Exam ?Constitutional:   ?   Appearance: Normal appearance.  ?HENT:  ?   Head: Normocephalic and atraumatic.  ?   Mouth/Throat:  ?   Pharynx: Oropharynx is clear. No oropharyngeal exudate.  ?Cardiovascular:  ?   Rate and Rhythm: Normal rate and regular rhythm.  ?   Heart sounds: Normal  heart sounds.  ?Pulmonary:  ?   Effort: Pulmonary effort is normal. No respiratory distress.  ?   Breath sounds: Normal breath sounds. No wheezing.  ?Abdominal:  ?   General: Bowel sounds are normal. There is no distension.  ?   Palpations: Abdomen is soft.  ?   Tenderness: There is no abdominal tenderness.  ?Musculoskeletal:     ?   General: No swelling or tenderness.  ?   Cervical back: Neck supple.  ?   Comments: No swelling noted in any of her PIP or DIP joints, no tenderness to palpation in any joints in her hand.  Patient does have difficulty making a fist in both hands  ?Lymphadenopathy:  ?   Cervical: No cervical adenopathy.  ?Neurological:  ?   Mental Status: She is alert and oriented to person, place, and time.  ?Psychiatric:  ?   Comments: Patient appears anxious  ? ? ? ? ? ?   ?Assessment & Plan:  ?Please see problem based charting for assessment and plan: ? ?

## 2021-06-23 NOTE — Assessment & Plan Note (Signed)
-   Patient continues to have depression and anxiety ?-Her PHQ-9 score is elevated to 19 today ?-Patient was prescribed Zoloft at her last visit but this was never picked up ?-I have restarted her Zoloft today.  Explained to the patient that it would take approximately 2 to 3 weeks before we can determine if this is effective or not for her ?-We will follow-up with the patient in 4 weeks and recheck her PHQ-9 score and assess her symptoms ?-Referral was also placed to Dr. Sallyanne Kuster given her uncontrolled depression anxiety ?

## 2021-06-23 NOTE — Assessment & Plan Note (Signed)
-   Patient is still smoking 2 to 3 cigarettes a day but this is much improved from her prior of 2 packs/day ?-Smoking cessation was advised especially given her recent TIA and history of hypertension and diabetes ?-We will follow-up with this at her next appointment ?

## 2021-06-23 NOTE — Assessment & Plan Note (Signed)
-   This problem is chronic and stable ?-Patient complains of chronically decreased appetite and states that she does not eat much during the day ?-Patient's weight has remained stable at 101 pounds (last weight was 96 pounds) ?-Patient's BMI is 20 ?-I suspect that her loss of appetite may be secondary to her underlying depression ?-We will check a TSH ?-We will follow-up with her in 4 weeks to see if her appetite has improved on medications for her depression and follow-up with integrated behavioral health ?

## 2021-06-24 LAB — SPECIMEN STATUS

## 2021-06-25 ENCOUNTER — Telehealth: Payer: Self-pay | Admitting: Internal Medicine

## 2021-06-25 ENCOUNTER — Telehealth: Payer: Self-pay

## 2021-06-25 LAB — BMP8+ANION GAP
Anion Gap: 16 mmol/L (ref 10.0–18.0)
BUN/Creatinine Ratio: 14 (ref 9–23)
BUN: 8 mg/dL (ref 6–24)
CO2: 19 mmol/L — ABNORMAL LOW (ref 20–29)
Calcium: 9.3 mg/dL (ref 8.7–10.2)
Chloride: 107 mmol/L — ABNORMAL HIGH (ref 96–106)
Creatinine, Ser: 0.57 mg/dL (ref 0.57–1.00)
Glucose: 83 mg/dL (ref 70–99)
Potassium: 4.1 mmol/L (ref 3.5–5.2)
Sodium: 142 mmol/L (ref 134–144)
eGFR: 110 mL/min/{1.73_m2} (ref >59)

## 2021-06-25 LAB — LIPID PANEL
Chol/HDL Ratio: 3.1 ratio (ref 0.0–4.4)
Cholesterol, Total: 148 mg/dL (ref 100–199)
HDL: 47 mg/dL (ref >39)
LDL Chol Calc (NIH): 86 mg/dL (ref 0–99)
Triglycerides: 76 mg/dL (ref 0–149)
VLDL Cholesterol Cal: 15 mg/dL (ref 5–40)

## 2021-06-25 LAB — TSH: TSH: 0.349 u[IU]/mL — ABNORMAL LOW (ref 0.450–4.500)

## 2021-06-25 NOTE — Telephone Encounter (Signed)
Patient called in for lab results. Attempted to call her with results as below, however, no answer. Left message on VM requesting return call.  ?  ? ?

## 2021-06-25 NOTE — Telephone Encounter (Signed)
Please see earlier phone note from today. ?

## 2021-06-25 NOTE — Telephone Encounter (Signed)
Requesting lab results, please call pt back.  

## 2021-06-25 NOTE — Telephone Encounter (Signed)
Attempted to call patient to go over her blood work with her.  Patient's BMP was within normal limits except for a mildly increased chloride level.  Patient's LDL was 86 with a HDL of 47.  We will continue rosuvastatin 20 mg daily.  Patient's TSH was noted to be mildly low.  We will need to follow-up repeat TSH and free T4 to see if she is truly hyperthyroid.  We will have patient follow-up for lab appointment next week.  I left a voicemail for the patient to call the clinic so that we can go over these results. ?

## 2021-06-28 ENCOUNTER — Telehealth: Payer: Self-pay

## 2021-06-28 ENCOUNTER — Telehealth: Payer: Self-pay | Admitting: Internal Medicine

## 2021-06-28 NOTE — Telephone Encounter (Signed)
I called the patient to discuss the results of her blood work with her. Patient's BMP was within normal limits except for a mildly increased chloride level.  Patient's LDL was 86 with a HDL of 47.  We will continue rosuvastatin 20 mg daily.  Patient's TSH was noted to be mildly low.  We will need to follow-up repeat TSH and free T4 to see if she is truly hyperthyroid.  We will have patient follow-up for an appointment for this.She also complained of persistent hand pain and has not made an appointment with the sports medicine clinic. She has been called twice by the clinic to set up an appointment but they have been unable to reach her. Patient also complains of wax in her ear that is affecting her hearing. She had wax removal done here last week which initially helped. Will have patient follow up next week for additional wax removal and lab work.   ?

## 2021-06-28 NOTE — Telephone Encounter (Signed)
Pt requesting to speak with Dr. Heide Spark for lab results. Pt is on break right now until 11:30, and then she has another break @ 1:30. Requesting the Dr. Heide Spark to call her at this time.  ?

## 2021-07-06 ENCOUNTER — Encounter: Payer: 59 | Admitting: Internal Medicine

## 2021-07-06 ENCOUNTER — Encounter: Payer: Self-pay | Admitting: Internal Medicine

## 2021-07-06 ENCOUNTER — Other Ambulatory Visit (INDEPENDENT_AMBULATORY_CARE_PROVIDER_SITE_OTHER): Payer: 59

## 2021-07-06 DIAGNOSIS — R7989 Other specified abnormal findings of blood chemistry: Secondary | ICD-10-CM

## 2021-07-06 NOTE — Progress Notes (Signed)
Patient arrived for lab visit. Reviewed Dr. Charlean Sanfilippo note from 3/27. Orders placed for TSH, free T4, and T3.  ?

## 2021-07-07 ENCOUNTER — Telehealth: Payer: Self-pay

## 2021-07-07 LAB — T3: T3, Total: 84 ng/dL (ref 71–180)

## 2021-07-07 LAB — T4, FREE: Free T4: 0.81 ng/dL — ABNORMAL LOW (ref 0.82–1.77)

## 2021-07-07 LAB — TSH: TSH: 0.426 u[IU]/mL — ABNORMAL LOW (ref 0.450–4.500)

## 2021-07-07 NOTE — Telephone Encounter (Signed)
Pt is requesting a call back ... she is wanting to know if her lab results are back as of yet  ?

## 2021-07-07 NOTE — Telephone Encounter (Signed)
Lab results are not back yet - called pt but no answer. ?

## 2021-07-08 ENCOUNTER — Telehealth: Payer: Self-pay | Admitting: Internal Medicine

## 2021-07-08 NOTE — Telephone Encounter (Signed)
I called the patient to discuss results of her blood work with her.  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  Patient was unavailable by phone.  I left a voicemail saying that I was calling about her thyroid results and that I will call again on Monday since I am not here over the long weekend. ?

## 2021-07-14 ENCOUNTER — Telehealth: Payer: Self-pay

## 2021-07-14 NOTE — Telephone Encounter (Signed)
Requesting lab results. Please call pt @ 9am or 1pm, that's when she takes her break from work.  ?

## 2021-07-15 ENCOUNTER — Telehealth: Payer: Self-pay | Admitting: Internal Medicine

## 2021-07-15 NOTE — Telephone Encounter (Signed)
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

## 2021-07-15 NOTE — Progress Notes (Signed)
Sounds good, will do. Thank you!

## 2021-07-15 NOTE — Telephone Encounter (Signed)
I called the patient to discuss the results of her lab work with her.  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  Etiology behind this remains uncertain at this time.  Patient will need further work-up to look for the etiology including MRI of the brain looking for pituitary adenoma as well as blood work to look for hypo-/hypersecretion of pituitary hormones.  Patient was anxious when we discussed this with her but explained to the patient that we will work this up and attempt to determine the etiology for this and treat the cause.  Patient expressed understanding and agreement with plan.  She is an appointment on April 19 and will keep this appointment.  I also discussed the case with Dr. Mcarthur Rossetti who is seen the patient on April 19 who is in agreement with plan. ?

## 2021-07-21 ENCOUNTER — Ambulatory Visit: Payer: Self-pay

## 2021-07-21 ENCOUNTER — Telehealth: Payer: Self-pay | Admitting: *Deleted

## 2021-07-21 ENCOUNTER — Ambulatory Visit: Payer: 59 | Admitting: Internal Medicine

## 2021-07-21 ENCOUNTER — Encounter: Payer: Self-pay | Admitting: Internal Medicine

## 2021-07-21 ENCOUNTER — Telehealth: Payer: Self-pay

## 2021-07-21 VITALS — BP 161/96 | HR 83 | Temp 98.2°F | Ht 59.0 in | Wt 101.1 lb

## 2021-07-21 DIAGNOSIS — M79642 Pain in left hand: Secondary | ICD-10-CM

## 2021-07-21 DIAGNOSIS — E038 Other specified hypothyroidism: Secondary | ICD-10-CM

## 2021-07-21 DIAGNOSIS — M79641 Pain in right hand: Secondary | ICD-10-CM | POA: Diagnosis not present

## 2021-07-21 DIAGNOSIS — R413 Other amnesia: Secondary | ICD-10-CM | POA: Diagnosis not present

## 2021-07-21 DIAGNOSIS — I10 Essential (primary) hypertension: Secondary | ICD-10-CM | POA: Diagnosis not present

## 2021-07-21 DIAGNOSIS — F331 Major depressive disorder, recurrent, moderate: Secondary | ICD-10-CM

## 2021-07-21 MED ORDER — DULOXETINE HCL 30 MG PO CPEP: ORAL_CAPSULE | ORAL | 0 refills | Status: DC

## 2021-07-21 MED ORDER — OLMESARTAN MEDOXOMIL 40 MG PO TABS: 40.0000 mg | ORAL_TABLET | Freq: Every day | ORAL | 3 refills | Status: DC

## 2021-07-21 NOTE — Chronic Care Management (AMB) (Signed)
?  Care Management  ? ?Note ? ?07/21/2021 ?Name: Leah Rose MRN: [id number] DOB: December 03, 1970 ? ?Leah Rose is a 51 y.o. year old female who is a primary care patient of Earl Lagos, MD. I reached out to Allena Napoleon by phone today offer care coordination services.  ? ?Leah Rose was given information about care management services today including:  ?Care management services include personalized support from designated clinical staff supervised by her physician, including individualized plan of care and coordination with other care providers ?24/7 contact phone numbers for assistance for urgent and routine care needs. ?The patient may stop care management services at any time by phone call to the office staff. ? ?Patient agreed to services and verbal consent obtained.  ? ?Follow up plan: ?Telephone appointment with care management team member scheduled for:07/26/21 ? ?Leah Rose  ?Care Guide, Embedded Care Coordination ?[facility]  ?Direct Dial: [phone] ? ?

## 2021-07-21 NOTE — Telephone Encounter (Signed)
Patient received a bus pass during today's visit. ?

## 2021-07-21 NOTE — Assessment & Plan Note (Signed)
BP Readings from Last 3 Encounters:  ?07/21/21 (!) 161/96  ?06/23/21 (!) 164/101  ?12/08/20 128/90  ? ?Patient is presenting today for follow up of her blood pressure. She is currently on omlesartan 20mg  daily and HCTZ 12.5mg  daily. She reports medication compliance. She checks her blood pressure at home daily and reports systolic BP's in . Initially, patient's BP 145/99, elevated to 161/96 on recheck at end of visit. She reports associated headaches when her blood pressure is high and she is anxious. Denies any chest pain, palpitations, vision changes, lightheadedness/dizziness, or weakness.  ? ?Plan:  ?Increase olmesartan to 40mg  daily and continue HCTZ 12.5mg  daily ?Advised to continue home BP checks ?Follow up in 4 weeks for BP check and BMP with increased ARB dosing ?Once on stable regimen, will consider combination pill  ?

## 2021-07-21 NOTE — Assessment & Plan Note (Signed)
Patient endorses that this is persistent. She has not been able to follow up with sports medicine clinic due to lack of transportation.  ? ?Plan: ?CCM referral to assist with transportation issues and other socioeconomic factors affecting patient's health ?Advised to follow up with sports medicine  ?

## 2021-07-21 NOTE — Chronic Care Management (AMB) (Signed)
? ?  07/21/2021 ? ?Leah Rose ?[birth year] ?[id number] ? ?Dr. Marva Panda requested this RNCM see patient in clinic to provide some Ensure and food pantry supplies .  Met with patient and gave her a new weekly medication separator, some Ensure and canned food.  Patient's real concern was her having terrible hand pain and the need to get to her hand specialist, but she is having transportation issues to that appointment. She takes the bus to [facility] appointments but for some reason the bus does not go to the hand specialist.  Reviewed patient insurance card to see if she may have transportation included but she has Pharmacist, community.  Message sent to care guide to schedule with Milus Height, MSW to see if she can assist the patient with her financial needs and her transportation issues.  Dr. Dareen Piano has put care coordination referral into system. ?Johnney Killian, RN, BSN, CCM ?Care Management Coordinator ?[facility] ?Phone: [phone]/LEZ: [phone]  ? ?

## 2021-07-21 NOTE — Assessment & Plan Note (Signed)
Patient continues to have poorly controlled depression and anxiety symptoms. She was prescribed zoloft at her prior visit; however, reports that she stopped taking this because it did not make her feel good. Reports worsening anxiety on zoloft. Patient initially with flat affect but became tearful towards the second half of the encounter. She does not have a support system. Denies any self-harm ideation at this time.  ? ?Plan: ?Will switch to duloxetine - 30mg  daily for 7 days followed by 60mg  daily  ?Follow up appointment with Dr on 4/26 ?

## 2021-07-21 NOTE — Progress Notes (Signed)
? ?  CC: hypertension follow up, central hypothyroidism, depression/anxiety ? ?HPI: ? ?[name] is a 51 y.o. female with PMHx as stated below presenting for hypertension follow up and central hypothyroidism. She also reports worsening depression/anxiety symptoms in setting of not taking sertraline. Please see problem based charting for complete assessment and plan. ? ?Past Medical History:  ?Diagnosis Date  ? Chronic venous insufficiency 06/23/2017  ? Essential hypertension 03/14/2006  ? Hypertensive emergency 07/15/2020  ? Hypokalemia 07/15/2020  ? Moderate recurrent major depression ([place]) 03/14/2006  ? Tobacco abuse 03/14/2006  ? ?Review of Systems:  Negative except as stated in HPI. ? ?Physical Exam: ? ?Vitals:  ? 07/21/21 0837  ?BP: (!) 145/99  ?Pulse: 86  ?Temp: 98.2 ?F (36.8 ?C)  ?TempSrc: Oral  ?SpO2: 100%  ?Weight: 101 lb 1.6 oz (45.9 kg)  ?Height: 4\' 11"  (1.499 m)  ? ?Physical Exam  ?Constitutional: Appears well-developed and well-nourished. No distress.  ?Cardiovascular: Normal rate, regular rhythm, S1 and S2 present, no murmurs, rubs, gallops.  Distal pulses intact ?Respiratory: Effort is normal.  Lungs are clear to auscultation bilaterally. ?Musculoskeletal: Normal bulk and tone.  No peripheral edema noted. ?Neurological: Is alert and oriented x4, no apparent focal deficits noted. ?Skin: Warm and dry.  No rash, erythema, lesions noted. ?Psychiatric: Tearful mood, flat affect.  ? ?Assessment & Plan:  ? ?See Encounters Tab for problem based charting. ? ?Patient discussed with Dr. Dareen Piano ? ?

## 2021-07-21 NOTE — Patient Instructions (Addendum)
Leah Rose, ? ?It was a pleasure seeing you in clinic. Today we discussed:  ? ? ?Depression/anxiety: ?At this time, discontinue sertraline. ?START duloxetine 30mg  daily for ONE WEEK followed by 60mg  daily after that.  ?You have an appointment with Dr Theodis Shove on 4/26 for talk therapy ?We will follow up with this at your next visit in 4 weeks.  ? ?Blood pressure: ?At this time, please INCREASE your olmesartan to 40mg  daily. Continue hydrochlorothiazide 12.5mg  daily. Continue to monitor your blood pressure at home daily.  ?Follow up with this at your next visit in 4 weeks.  ? ?Thyroid:  ?We are doing some further lab work and MRI ordered to evaluate for possible pituitary mass. I will call you with the results.  ? ?If you have any questions or concerns, please call our clinic at [phone] between 9am-5pm and after hours call [phone] and ask for the internal medicine resident on call. If you feel you are having a medical emergency please call 911.  ? ?Thank you, we look forward to helping you remain healthy! ? ? ? ?

## 2021-07-21 NOTE — Assessment & Plan Note (Signed)
Patient's thyroid labs consistent with central hypothyroidism. She notes ongoing fatigue with depression/anxiety but denies any other symptoms at this time.  ? ?Plan: ?ACTH, AM Cortisol, FSH/LH, IGF-1 levels ?MRI Brain wo Contrast  ?

## 2021-07-22 LAB — INSULIN-LIKE GROWTH FACTOR: Insulin-Like GF-1: 154 ng/mL (ref 65–216)

## 2021-07-22 LAB — LUTEINIZING HORMONE: LH: 31.8 m[IU]/mL

## 2021-07-22 LAB — CORTISOL: Cortisol: 7.7 ug/dL (ref 6.2–19.4)

## 2021-07-22 LAB — PROLACTIN: Prolactin: 12.2 ng/mL (ref 4.8–23.3)

## 2021-07-22 LAB — ACTH: ACTH: 19.6 pg/mL (ref 7.2–63.3)

## 2021-07-22 LAB — FOLLICLE STIMULATING HORMONE: FSH: 89.2 m[IU]/mL

## 2021-07-22 NOTE — Progress Notes (Signed)
Internal Medicine Clinic Attending ° °Case discussed with Dr. Aslam  At the time of the visit.  We reviewed the resident’s history and exam and pertinent patient test results.  I agree with the assessment, diagnosis, and plan of care documented in the resident’s note.  °

## 2021-07-26 ENCOUNTER — Telehealth: Payer: Self-pay | Admitting: Licensed Clinical Social Worker

## 2021-07-26 ENCOUNTER — Telehealth: Payer: 59 | Admitting: Licensed Clinical Social Worker

## 2021-07-26 NOTE — Telephone Encounter (Signed)
?  Care Management  ? ?Follow Up Note ? ? ?07/26/2021 ?Name: Leah Rose MRN: [id number] DOB: 10-18-1970 ? ? ?Referred by: Earl Lagos, MD ?Reason for referral : No chief complaint on file. ? ? ?An unsuccessful telephone outreach was attempted today. The patient was referred to the case management team for assistance with care management and care coordination.  ? ?Contacted patient twice. Only able to leave VM the 2nd time. Will reschedule patient. ? ? ?Christen Butter, BSW, MSW, LCSW-A  ?Social Worker ?[facility] Care Management  ?[phone] ?  ?

## 2021-07-28 ENCOUNTER — Telehealth: Payer: Self-pay

## 2021-07-28 ENCOUNTER — Telehealth: Payer: Self-pay | Admitting: Behavioral Health

## 2021-07-28 ENCOUNTER — Institutional Professional Consult (permissible substitution): Payer: 59 | Admitting: Behavioral Health

## 2021-07-28 NOTE — Telephone Encounter (Signed)
Called and discussed lab results with patient. Advised to follow up with scheduling MRI Brain. She expresses understanding and will call to schedule

## 2021-07-28 NOTE — Telephone Encounter (Signed)
Patient calling again for lab results. She is not on MyChart and is unable to access her VM. She is requesting a letter be sent to her explaining results. ?

## 2021-07-28 NOTE — Telephone Encounter (Signed)
Lft 3 msgs for Pt today re: [facility] telehealth Intro appt. Requested Pt RC to [facility] & r/s @ her convenience. It is noted Pt does not get off work until 1:30pm. ? ?Dr. Monna Fam ?

## 2021-07-28 NOTE — Telephone Encounter (Signed)
Requesting lab results, please call pt @ 1:30. She is at work and this will be her next break.  ?

## 2021-07-29 ENCOUNTER — Telehealth: Payer: Self-pay | Admitting: *Deleted

## 2021-07-29 NOTE — Chronic Care Management (AMB) (Signed)
?  Care Management  ? ?Note ? ?07/29/2021 ?Name: [name] MRN: [id number] DOB: 07-21-1970 ? ?Leah Rose is a 51 y.o. year old female who is a primary care patient of Aldine Contes, MD and is actively engaged with the care management team. I reached out to Jolayne Haines by phone today to assist with re-scheduling an initial visit with the BSW ? ?Follow up plan: ?Unsuccessful telephone outreach attempt made. A HIPAA compliant phone message was left for the patient providing contact information and requesting a return call.  ?The care management team will reach out to the patient again over the next 7 days.  ?If patient returns call to provider office, please advise to call [place] at [phone]. ? ?Laverda Sorenson  ?Care Guide, Embedded Care Coordination ?[facility]  Care Management  ?Direct Dial: [phone] ? ?

## 2021-08-05 NOTE — Chronic Care Management (AMB) (Signed)
?  Care Coordination ?Note ? ?08/05/2021 ?Name: Jennalise Kersten MRN: [id number] DOB: 25-Apr-1970 ? ?Alianys Labella is a 51 y.o. year old female who is a primary care patient of Aldine Contes, MD and is actively engaged with the care management team. I reached out to Jolayne Haines by phone today to assist with re-scheduling an initial visit with the BSW ? ?Follow up plan: ?Telephone appointment with care management team member scheduled for:5/11/232 ? ?Laverda Sorenson  ?Care Guide, Embedded Care Coordination ?[place]  Care Management  ?Direct Dial: [phone] ? ?

## 2021-08-12 ENCOUNTER — Ambulatory Visit: Payer: 59 | Admitting: Licensed Clinical Social Worker

## 2021-08-12 NOTE — Chronic Care Management (AMB) (Signed)
  Care Management   Social Work Visit Note  08/12/2021 Name: Leah Rose MRN: [id number] DOB: 1970/10/06  Leah Rose is a 51 y.o. year old female who sees Earl Lagos, MD for primary care. The care management team was consulted for assistance with care management and care coordination needs related to  Initial Visit    Patient was given the following information about care management and care coordination services today, agreed to services, and gave verbal consent: 1.care management/care coordination services include personalized support from designated clinical staff supervised by their physician, including individualized plan of care and coordination with other care providers 2. 24/7 contact phone numbers for assistance for urgent and routine care needs. 3. The patient may stop care management/care coordination services at any time by phone call to the office staff.  Engaged with patient by telephone for initial visit in response to provider referral for social work chronic care management and care coordination services.  Assessment: Review of patient history, allergies, and health status during evaluation of patient need for care management/care coordination services.    Interventions:  Patient interviewed and appropriate assessments performed Collaborated with clinical team regarding patient needs  Patient stated she is suffering from depression. SW informed patient of appointment with Dr. Monna Fam on 05/23. Patient advised she just began medication. Patient needed assistance with ; transportation, food and housing. Patient will contact [employer] to inquire on medical transportation. SW screened patient for FNS eligibility and patient is ineligible due to over income. SW referred patient to food banks, pantries and informed patient of [facility] food pantry. Patient is currently residing in a hotel and will continue to search for housing.  SDOH (Social Determinants of Health) assessments  performed: Yes     Plan:  Patient will follow up with Dr. Monna Fam.No additional follow up needed from SW at this time.   Ander Gaster, MSW  Social Worker [facility] Care Management  [phone]

## 2021-08-18 ENCOUNTER — Encounter: Payer: Self-pay | Admitting: Internal Medicine

## 2021-08-18 ENCOUNTER — Encounter: Payer: 59 | Admitting: Internal Medicine

## 2021-08-18 NOTE — Assessment & Plan Note (Deleted)
On prior office visit patient endorsed symptoms of hypothyroidism including ongoing fatigue, depression/anxiety.  The following labs were obtained: ACTH, a.m. cortisol, prolactin, FSH/LH, IGF-I levels and an MRI brain without contrast.  All hormones were within normal limits and consistent with postmenopausal FSH/LH levels.  TSH low at 0.42 and free T4 low at 0.81 (reference range 0.82 to-1.77). Patient has not obtained MRI of the brain.  Per chart review MRI of the brain was obtained June 2022 which revealed normal intracranial MR angiography, no acute brain findings.

## 2021-08-18 NOTE — Assessment & Plan Note (Deleted)
On prior office visit, patient discontinued Zoloft (previously prescribed) due to feelings of worsening anxiety.  Patient was started on duloxetine 30 mg daily which was titrated up to 60 mg daily.  She had an upcoming appointment with Dr. Sallyanne Kuster 07/28/2021.  Dr. Sallyanne Kuster reached out to patient for this appointment however phone calls went to voicemail.  Patient worked with chronic care management for assistance with transportation/food/housing.

## 2021-08-18 NOTE — Assessment & Plan Note (Deleted)
Home medications include olmesartan 40 mg daily and hydrochlorothiazide 12.5 mg daily.  During prior office visit patient blood pressure was not at goal and her on losartan was recently increased from 20 mg to 40 mg daily.  P: - Repeat BMP

## 2021-08-24 ENCOUNTER — Telehealth: Payer: Self-pay | Admitting: Behavioral Health

## 2021-08-24 ENCOUNTER — Ambulatory Visit: Payer: 59 | Admitting: Behavioral Health

## 2021-08-24 NOTE — Telephone Encounter (Signed)
Unable to reach Pt today after several attempts. Instructed Pt to [facility] to [facility] & r/s @ her convenience since this is the second unsuccessful attempt to reach her.  Dr. Monna Fam

## 2021-08-25 NOTE — Patient Instructions (Signed)
Visit Information  Instructions:   Patient was given the following information about care management and care coordination services today, agreed to services, and gave verbal consent: 1.care management/care coordination services include personalized support from designated clinical staff supervised by their physician, including individualized plan of care and coordination with other care providers 2. 24/7 contact phone numbers for assistance for urgent and routine care needs. 3. The patient may stop care management/care coordination services at any time by phone call to the office staff.  Patient verbalizes understanding of instructions and care plan provided today and agrees to view in MyChart. Active MyChart status and patient understanding of how to access instructions and care plan via MyChart confirmed with patient.     No further follow up required: .  Maansi Wike, BSW , MSW Social Worker [facility]  [phone]      

## 2021-09-03 ENCOUNTER — Ambulatory Visit (HOSPITAL_COMMUNITY): Admission: RE | Admit: 2021-09-03 | Payer: 59 | Source: Ambulatory Visit

## 2021-09-22 ENCOUNTER — Other Ambulatory Visit: Payer: Self-pay | Admitting: Internal Medicine

## 2021-09-22 DIAGNOSIS — E038 Other specified hypothyroidism: Secondary | ICD-10-CM

## 2021-10-23 ENCOUNTER — Ambulatory Visit (HOSPITAL_COMMUNITY): Admission: RE | Admit: 2021-10-23 | Payer: 59 | Source: Ambulatory Visit

## 2021-11-19 ENCOUNTER — Emergency Department (HOSPITAL_COMMUNITY)
Admission: EM | Admit: 2021-11-19 | Discharge: 2021-11-19 | Disposition: A | Discharge status: Home / Self Care | Payer: 59 | Source: Home / Self Care | Attending: Emergency Medicine | Admitting: Emergency Medicine

## 2021-11-19 ENCOUNTER — Encounter (HOSPITAL_COMMUNITY): Payer: Self-pay | Admitting: Emergency Medicine

## 2021-11-19 ENCOUNTER — Emergency Department (HOSPITAL_COMMUNITY): Payer: 59

## 2021-11-19 DIAGNOSIS — N3000 Acute cystitis without hematuria: Secondary | ICD-10-CM | POA: Diagnosis not present

## 2021-11-19 DIAGNOSIS — Z79899 Other long term (current) drug therapy: Secondary | ICD-10-CM | POA: Insufficient documentation

## 2021-11-19 DIAGNOSIS — N390 Urinary tract infection, site not specified: Secondary | ICD-10-CM | POA: Insufficient documentation

## 2021-11-19 DIAGNOSIS — M545 Low back pain, unspecified: Secondary | ICD-10-CM | POA: Insufficient documentation

## 2021-11-19 DIAGNOSIS — R109 Unspecified abdominal pain: Secondary | ICD-10-CM | POA: Insufficient documentation

## 2021-11-19 DIAGNOSIS — A4151 Sepsis due to Escherichia coli [E. coli]: Secondary | ICD-10-CM | POA: Diagnosis not present

## 2021-11-19 LAB — COMPREHENSIVE METABOLIC PANEL
ALT: 12 U/L (ref 0–44)
AST: 16 U/L (ref 15–41)
Albumin: 3.7 g/dL (ref 3.5–5.0)
Alkaline Phosphatase: 69 U/L (ref 38–126)
Anion gap: 6 (ref 5–15)
BUN: 10 mg/dL (ref 6–20)
CO2: 23 mmol/L (ref 22–32)
Calcium: 8.9 mg/dL (ref 8.9–10.3)
Chloride: 111 mmol/L (ref 98–111)
Creatinine, Ser: 0.57 mg/dL (ref 0.44–1.00)
GFR, Estimated: >60 mL/min (ref >60)
Glucose, Bld: 120 mg/dL — ABNORMAL HIGH (ref 70–99)
Potassium: 3.3 mmol/L — ABNORMAL LOW (ref 3.5–5.1)
Sodium: 140 mmol/L (ref 135–145)
Total Bilirubin: 0.6 mg/dL (ref 0.3–1.2)
Total Protein: 7.7 g/dL (ref 6.5–8.1)

## 2021-11-19 LAB — URINALYSIS, ROUTINE W REFLEX MICROSCOPIC
Bilirubin Urine: NEGATIVE
Glucose, UA: NEGATIVE mg/dL
Ketones, ur: NEGATIVE mg/dL
Nitrite: POSITIVE — AB
Protein, ur: NEGATIVE mg/dL
Specific Gravity, Urine: 1.015 (ref 1.005–1.030)
pH: 5 (ref 5.0–8.0)

## 2021-11-19 LAB — I-STAT BETA HCG BLOOD, ED (MC, WL, AP ONLY): I-stat hCG, quantitative: <5 m[IU]/mL (ref <5)

## 2021-11-19 LAB — CBC WITH DIFFERENTIAL/PLATELET
Abs Immature Granulocytes: 0.12 10*3/uL — ABNORMAL HIGH (ref 0.00–0.07)
Basophils Absolute: 0 10*3/uL (ref 0.0–0.1)
Basophils Relative: 0 %
Eosinophils Absolute: 0 10*3/uL (ref 0.0–0.5)
Eosinophils Relative: 0 %
HCT: 39.7 % (ref 36.0–46.0)
Hemoglobin: 12.7 g/dL (ref 12.0–15.0)
Immature Granulocytes: 1 %
Lymphocytes Relative: 12 %
Lymphs Abs: 1.6 10*3/uL (ref 0.7–4.0)
MCH: 26.3 pg (ref 26.0–34.0)
MCHC: 32 g/dL (ref 30.0–36.0)
MCV: 82.4 fL (ref 80.0–100.0)
Monocytes Absolute: 1.2 10*3/uL — ABNORMAL HIGH (ref 0.1–1.0)
Monocytes Relative: 8 %
Neutro Abs: 10.9 10*3/uL — ABNORMAL HIGH (ref 1.7–7.7)
Neutrophils Relative %: 79 %
Platelets: 273 10*3/uL (ref 150–400)
RBC: 4.82 MIL/uL (ref 3.87–5.11)
RDW: 15.2 % (ref 11.5–15.5)
WBC: 13.9 10*3/uL — ABNORMAL HIGH (ref 4.0–10.5)
nRBC: 0 % (ref 0.0–0.2)

## 2021-11-19 LAB — RAPID URINE DRUG SCREEN, HOSP PERFORMED
Amphetamines: NOT DETECTED
Barbiturates: NOT DETECTED
Benzodiazepines: NOT DETECTED
Cocaine: NOT DETECTED
Opiates: NOT DETECTED
Tetrahydrocannabinol: NOT DETECTED

## 2021-11-19 LAB — LACTIC ACID, PLASMA: Lactic Acid, Venous: 1.3 mmol/L (ref 0.5–1.9)

## 2021-11-19 LAB — LIPASE, BLOOD: Lipase: 24 U/L (ref 11–51)

## 2021-11-19 MED ORDER — SODIUM CHLORIDE 0.9 % IV BOLUS
1000.0000 mL | Freq: Once | INTRAVENOUS | Status: AC
  Administered 2021-11-19: 1000 mL via INTRAVENOUS

## 2021-11-19 MED ORDER — ONDANSETRON HCL 4 MG/2ML IJ SOLN
4.0000 mg | Freq: Once | INTRAMUSCULAR | Status: AC
  Administered 2021-11-19: 4 mg via INTRAVENOUS
  Filled 2021-11-19: qty 2

## 2021-11-19 MED ORDER — KETOROLAC TROMETHAMINE 30 MG/ML IJ SOLN
15.0000 mg | Freq: Once | INTRAMUSCULAR | Status: AC
  Administered 2021-11-19: 15 mg via INTRAVENOUS
  Filled 2021-11-19: qty 1

## 2021-11-19 MED ORDER — CEPHALEXIN 250 MG PO CAPS
250.0000 mg | ORAL_CAPSULE | Freq: Once | ORAL | Status: AC
Start: 2021-11-19 — End: 2021-11-19
  Administered 2021-11-19: 250 mg via ORAL
  Filled 2021-11-19: qty 1

## 2021-11-19 MED ORDER — HYDROCODONE-ACETAMINOPHEN 5-325 MG PO TABS
1.0000 | ORAL_TABLET | Freq: Once | ORAL | Status: AC
  Administered 2021-11-19: 1 via ORAL
  Filled 2021-11-19: qty 1

## 2021-11-19 MED ORDER — HYDROCODONE-ACETAMINOPHEN 5-325 MG PO TABS: 1.0000 | ORAL_TABLET | Freq: Four times a day (QID) | ORAL | 0 refills | Status: DC | PRN

## 2021-11-19 MED ORDER — CEPHALEXIN 500 MG PO CAPS: 500.0000 mg | ORAL_CAPSULE | Freq: Four times a day (QID) | ORAL | 0 refills | Status: DC

## 2021-11-19 MED ORDER — ONDANSETRON HCL 4 MG PO TABS
4.0000 mg | ORAL_TABLET | Freq: Four times a day (QID) | ORAL | 0 refills | Status: DC
Start: 2021-11-19 — End: 2021-11-21

## 2021-11-19 MED ORDER — DIAZEPAM 5 MG/ML IJ SOLN
2.5000 mg | Freq: Once | INTRAMUSCULAR | Status: AC
  Administered 2021-11-19: 2.5 mg via INTRAVENOUS
  Filled 2021-11-19: qty 2

## 2021-11-19 MED ORDER — POTASSIUM CHLORIDE CRYS ER 20 MEQ PO TBCR
40.0000 meq | EXTENDED_RELEASE_TABLET | Freq: Once | ORAL | Status: AC
  Administered 2021-11-19: 40 meq via ORAL
  Filled 2021-11-19: qty 2

## 2021-11-19 NOTE — ED Notes (Signed)
Patient transported to CT 

## 2021-11-19 NOTE — Discharge Instructions (Signed)
Return for any problem.  ?

## 2021-11-19 NOTE — ED Triage Notes (Signed)
Pt arrives via EMS from home with left sided back pain since midnight. Denies urinary symptoms or n/v/d.

## 2021-11-19 NOTE — ED Notes (Signed)
Pt and daughter verbalized understanding of discharge paperwork and prescriptions. Pt reports she cannot take bus in her condition. Pt wheeled to waiting room with daughter and waiting on father to pick her up.

## 2021-11-19 NOTE — ED Provider Notes (Signed)
[facility]-EMERGENCY DEPT Provider Note   CSN: [id number] Arrival date & time: 11/19/21  0749     History  Chief Complaint  Patient presents with   Back Pain    Leah Rose is a 51 y.o. female.  51 year old female with prior medical history as detailed below presents for evaluation.  Patient complains of significant left-sided flank pain that began yesterday afternoon.  Patient reports that she did not take anything at home for pain.  Patient's pain is worsened over the course of the last several hours and she decided to come to the ED for evaluation.  She denies associated nausea vomiting or diarrhea.  She denies urinary symptoms such as dysuria, increased urinary frequency, or other complaint.  She denies bowel movement change.  She denies fever.  She denies prior history of similar symptoms.  She reports that the pain is constant but is worse with movement of her back or twisting.  The history is provided by the patient and medical records.  Back Pain Location:  Lumbar spine Quality:  Aching and stabbing Radiates to:  Does not radiate Pain severity:  Moderate Onset quality:  Sudden Duration:  1 day Timing:  Constant Progression:  Worsening      Home Medications Prior to Admission medications   Medication Sig Start Date End Date Taking? Authorizing Provider  DULoxetine (CYMBALTA) 30 MG capsule Take 1 capsule (30 mg total) by mouth daily for 7 days, THEN 2 capsules (60 mg total) daily. 07/21/21 08/27/21  Eliezer Bottom, MD  hydrochlorothiazide (HYDRODIURIL) 12.5 MG tablet Take 1 tablet by mouth once daily 05/31/21   Earl Lagos, MD  olmesartan (BENICAR) 40 MG tablet Take 1 tablet (40 mg total) by mouth daily. 07/21/21 07/21/22  Eliezer Bottom, MD  rosuvastatin (CRESTOR) 20 MG tablet Take 1 tablet (20 mg total) by mouth daily. 05/31/21   Earl Lagos, MD      Allergies    Penicillins and Sulfonamide derivatives    Review of Systems   Review of  Systems  Musculoskeletal:  Positive for back pain.  All other systems reviewed and are negative.   Physical Exam Updated Vital Signs BP (!) 138/92 (BP Location: Right Arm)   Pulse 89   Temp 98.7 F (37.1 C) (Oral)   Resp 18   Ht 4\' 11"  (1.499 m)   Wt 45.8 kg   SpO2 99%   BMI 20.40 kg/m  Physical Exam Vitals and nursing note reviewed.  Constitutional:      General: She is not in acute distress.    Appearance: Normal appearance. She is well-developed.  HENT:     Head: Normocephalic and atraumatic.  Eyes:     Conjunctiva/sclera: Conjunctivae normal.     Pupils: Pupils are equal, round, and reactive to light.  Cardiovascular:     Rate and Rhythm: Normal rate and regular rhythm.     Heart sounds: Normal heart sounds.  Pulmonary:     Effort: Pulmonary effort is normal. No respiratory distress.     Breath sounds: Normal breath sounds.  Abdominal:     General: There is no distension.     Palpations: Abdomen is soft.     Tenderness: There is no abdominal tenderness.  Genitourinary:    Comments: Mild left CVA tenderness Musculoskeletal:        General: No deformity. Normal range of motion.     Cervical back: Normal range of motion and neck supple.  Skin:    General: Skin is warm  and dry.  Neurological:     General: No focal deficit present.     Mental Status: She is alert and oriented to person, place, and time.     ED Results / Procedures / Treatments   Labs (all labs ordered are listed, but only abnormal results are displayed) Labs Reviewed  COMPREHENSIVE METABOLIC PANEL - Abnormal; Notable for the following components:      Result Value   Potassium 3.3 (*)    Glucose, Bld 120 (*)    All other components within normal limits  CBC WITH DIFFERENTIAL/PLATELET - Abnormal; Notable for the following components:   WBC 13.9 (*)    Neutro Abs 10.9 (*)    Monocytes Absolute 1.2 (*)    Abs Immature Granulocytes 0.12 (*)    All other components within normal limits   URINALYSIS, ROUTINE W REFLEX MICROSCOPIC - Abnormal; Notable for the following components:   Hgb urine dipstick SMALL (*)    Nitrite POSITIVE (*)    Leukocytes,Ua TRACE (*)    Bacteria, UA MANY (*)    All other components within normal limits  URINE CULTURE  LACTIC ACID, PLASMA  RAPID URINE DRUG SCREEN, HOSP PERFORMED  LIPASE, BLOOD  I-STAT BETA HCG BLOOD, ED ([facility], [facility], [facility] ONLY)    EKG None  Radiology No results found.  Procedures Procedures    Medications Ordered in ED Medications  ketorolac (TORADOL) 30 MG/ML injection 15 mg (has no administration in time range)  sodium chloride 0.9 % bolus 1,000 mL (has no administration in time range)  ondansetron (ZOFRAN) injection 4 mg (has no administration in time range)    ED Course/ Medical Decision Making/ A&P                           Medical Decision Making Amount and/or Complexity of Data Reviewed Labs: ordered. Radiology: ordered.  Risk Prescription drug management.    Medical Screen Complete  This patient presented to the ED with complaint of back pain.  This complaint involves an extensive number of treatment options. The initial differential diagnosis includes, but is not limited to, muscular back pain, renal colic, other intra-abdominal pathology, infection, etc.  This presentation is:  Acute, Self-Limited, Previously Undiagnosed, Uncertain Prognosis, Complicated, Systemic Symptoms, and Threat to Life/Bodily Function  Patient is presenting with complaint of left-sided back pain.  Patient's describe symptoms are most consistent with likely muscular strain.  However, patient with evidence of possible early urinary tract infection on UA.  We will treat with antibiotics.  Patient's pain will be managed with prescribed pain medications and antinausea medication.  Work-up on the whole otherwise without significant abnormality.  Patient is improved and reassured after ED evaluation.  She does understand need for  close outpatient follow-up.  Strict return precautions given and understood.  Additional history obtained:  External records from outside sources obtained and reviewed including prior ED visits and prior Inpatient records.    Lab Tests:  I ordered and personally interpreted labs.  The pertinent results include: CBC, CMP, lipase, lactic acid, UA, urine tox   Imaging Studies ordered:  I ordered imaging studies including CT abdomen pelvis I independently visualized and interpreted obtained imaging which showed NAD I agree with the radiologist interpretation.   Cardiac Monitoring:  The patient was maintained on a cardiac monitor.  I personally viewed and interpreted the cardiac monitor which showed an underlying rhythm of: NSR   Medicines ordered:  I ordered medication including  Zofran, Toradol, Valium, IV fluids for pain, dehydration Reevaluation of the patient after these medicines showed that the patient: improved    Problem List / ED Course:  Back pain, possible UTI   Reevaluation:  After the interventions noted above, I reevaluated the patient and found that they have: improved   Disposition:  After consideration of the diagnostic results and the patients response to treatment, I feel that the patent would benefit from close outpatient follow-up.          Final Clinical Impression(s) / ED Diagnoses Final diagnoses:  Acute left-sided low back pain without sciatica  Urinary tract infection without hematuria, site unspecified    Rx / DC Orders ED Discharge Orders          Ordered    cephALEXin (KEFLEX) 500 MG capsule  4 times daily        11/19/21 1152    HYDROcodone-acetaminophen (NORCO/VICODIN) 5-325 MG tablet  Every 6 hours PRN        11/19/21 1152    ondansetron (ZOFRAN) 4 MG tablet  Every 6 hours        11/19/21 1152              Wynetta Fines, MD 11/19/21 1155

## 2021-11-20 ENCOUNTER — Encounter (HOSPITAL_COMMUNITY): Payer: Self-pay

## 2021-11-20 ENCOUNTER — Inpatient Hospital Stay (HOSPITAL_COMMUNITY)
Admission: EM | Admit: 2021-11-20 | Discharge: 2021-11-29 | DRG: 872 | Disposition: A | Discharge status: Home Health | Payer: 59 | Attending: Internal Medicine | Admitting: Internal Medicine

## 2021-11-20 ENCOUNTER — Other Ambulatory Visit: Payer: Self-pay

## 2021-11-20 ENCOUNTER — Emergency Department (HOSPITAL_COMMUNITY): Payer: 59

## 2021-11-20 DIAGNOSIS — F329 Major depressive disorder, single episode, unspecified: Secondary | ICD-10-CM | POA: Diagnosis present

## 2021-11-20 DIAGNOSIS — F32A Depression, unspecified: Secondary | ICD-10-CM

## 2021-11-20 DIAGNOSIS — A4151 Sepsis due to Escherichia coli [E. coli]: Principal | ICD-10-CM | POA: Diagnosis present

## 2021-11-20 DIAGNOSIS — G8929 Other chronic pain: Secondary | ICD-10-CM | POA: Diagnosis present

## 2021-11-20 DIAGNOSIS — I10 Essential (primary) hypertension: Secondary | ICD-10-CM | POA: Diagnosis present

## 2021-11-20 DIAGNOSIS — B9562 Methicillin resistant Staphylococcus aureus infection as the cause of diseases classified elsewhere: Secondary | ICD-10-CM | POA: Diagnosis present

## 2021-11-20 DIAGNOSIS — N390 Urinary tract infection, site not specified: Secondary | ICD-10-CM | POA: Diagnosis not present

## 2021-11-20 DIAGNOSIS — Z8349 Family history of other endocrine, nutritional and metabolic diseases: Secondary | ICD-10-CM

## 2021-11-20 DIAGNOSIS — E785 Hyperlipidemia, unspecified: Secondary | ICD-10-CM | POA: Diagnosis present

## 2021-11-20 DIAGNOSIS — F17210 Nicotine dependence, cigarettes, uncomplicated: Secondary | ICD-10-CM | POA: Diagnosis present

## 2021-11-20 DIAGNOSIS — Z8673 Personal history of transient ischemic attack (TIA), and cerebral infarction without residual deficits: Secondary | ICD-10-CM

## 2021-11-20 DIAGNOSIS — M545 Low back pain, unspecified: Secondary | ICD-10-CM | POA: Diagnosis present

## 2021-11-20 DIAGNOSIS — E860 Dehydration: Secondary | ICD-10-CM | POA: Diagnosis present

## 2021-11-20 DIAGNOSIS — L02423 Furuncle of right upper limb: Secondary | ICD-10-CM | POA: Diagnosis present

## 2021-11-20 DIAGNOSIS — Z882 Allergy status to sulfonamides status: Secondary | ICD-10-CM

## 2021-11-20 DIAGNOSIS — T361X5A Adverse effect of cephalosporins and other beta-lactam antibiotics, initial encounter: Secondary | ICD-10-CM | POA: Diagnosis present

## 2021-11-20 DIAGNOSIS — N3000 Acute cystitis without hematuria: Secondary | ICD-10-CM

## 2021-11-20 DIAGNOSIS — L209 Atopic dermatitis, unspecified: Secondary | ICD-10-CM | POA: Diagnosis present

## 2021-11-20 DIAGNOSIS — A419 Sepsis, unspecified organism: Secondary | ICD-10-CM

## 2021-11-20 DIAGNOSIS — E871 Hypo-osmolality and hyponatremia: Secondary | ICD-10-CM | POA: Diagnosis not present

## 2021-11-20 DIAGNOSIS — Z880 Allergy status to penicillin: Secondary | ICD-10-CM

## 2021-11-20 DIAGNOSIS — Z888 Allergy status to other drugs, medicaments and biological substances status: Secondary | ICD-10-CM

## 2021-11-20 DIAGNOSIS — Z79899 Other long term (current) drug therapy: Secondary | ICD-10-CM

## 2021-11-20 DIAGNOSIS — T7840XA Allergy, unspecified, initial encounter: Principal | ICD-10-CM

## 2021-11-20 DIAGNOSIS — Z8249 Family history of ischemic heart disease and other diseases of the circulatory system: Secondary | ICD-10-CM

## 2021-11-20 DIAGNOSIS — M62838 Other muscle spasm: Secondary | ICD-10-CM | POA: Diagnosis present

## 2021-11-20 DIAGNOSIS — M533 Sacrococcygeal disorders, not elsewhere classified: Secondary | ICD-10-CM | POA: Diagnosis present

## 2021-11-20 DIAGNOSIS — R8271 Bacteriuria: Secondary | ICD-10-CM | POA: Diagnosis present

## 2021-11-20 DIAGNOSIS — I959 Hypotension, unspecified: Secondary | ICD-10-CM | POA: Diagnosis present

## 2021-11-20 DIAGNOSIS — K007 Teething syndrome: Secondary | ICD-10-CM | POA: Diagnosis present

## 2021-11-20 DIAGNOSIS — Z20822 Contact with and (suspected) exposure to covid-19: Secondary | ICD-10-CM | POA: Diagnosis present

## 2021-11-20 DIAGNOSIS — I872 Venous insufficiency (chronic) (peripheral): Secondary | ICD-10-CM | POA: Diagnosis present

## 2021-11-20 DIAGNOSIS — E119 Type 2 diabetes mellitus without complications: Secondary | ICD-10-CM | POA: Diagnosis present

## 2021-11-20 DIAGNOSIS — N12 Tubulo-interstitial nephritis, not specified as acute or chronic: Secondary | ICD-10-CM | POA: Diagnosis present

## 2021-11-20 DIAGNOSIS — R7881 Bacteremia: Secondary | ICD-10-CM

## 2021-11-20 DIAGNOSIS — Z1623 Resistance to quinolones and fluoroquinolones: Secondary | ICD-10-CM | POA: Diagnosis present

## 2021-11-20 LAB — CBC WITH DIFFERENTIAL/PLATELET
Abs Immature Granulocytes: 0 10*3/uL (ref 0.00–0.07)
Basophils Absolute: 0.2 10*3/uL — ABNORMAL HIGH (ref 0.0–0.1)
Basophils Relative: 2 %
Eosinophils Absolute: 0 10*3/uL (ref 0.0–0.5)
Eosinophils Relative: 0 %
HCT: 42.7 % (ref 36.0–46.0)
Hemoglobin: 13.8 g/dL (ref 12.0–15.0)
Lymphocytes Relative: 16 %
Lymphs Abs: 1.5 10*3/uL (ref 0.7–4.0)
MCH: 26.3 pg (ref 26.0–34.0)
MCHC: 32.3 g/dL (ref 30.0–36.0)
MCV: 81.5 fL (ref 80.0–100.0)
Monocytes Absolute: 0.2 10*3/uL (ref 0.1–1.0)
Monocytes Relative: 2 %
Neutro Abs: 7.5 10*3/uL (ref 1.7–7.7)
Neutrophils Relative %: 80 %
Platelets: 280 10*3/uL (ref 150–400)
RBC: 5.24 MIL/uL — ABNORMAL HIGH (ref 3.87–5.11)
RDW: 15.1 % (ref 11.5–15.5)
WBC: 9.4 10*3/uL (ref 4.0–10.5)
nRBC: 0 % (ref 0.0–0.2)
nRBC: 1 /100 WBC — ABNORMAL HIGH

## 2021-11-20 LAB — I-STAT BETA HCG BLOOD, ED (MC, WL, AP ONLY): I-stat hCG, quantitative: <5 m[IU]/mL (ref <5)

## 2021-11-20 LAB — COMPREHENSIVE METABOLIC PANEL
ALT: 17 U/L (ref 0–44)
AST: 25 U/L (ref 15–41)
Albumin: 2.4 g/dL — ABNORMAL LOW (ref 3.5–5.0)
Alkaline Phosphatase: 45 U/L (ref 38–126)
Anion gap: 7 (ref 5–15)
BUN: 11 mg/dL (ref 6–20)
CO2: 19 mmol/L — ABNORMAL LOW (ref 22–32)
Calcium: 8 mg/dL — ABNORMAL LOW (ref 8.9–10.3)
Chloride: 110 mmol/L (ref 98–111)
Creatinine, Ser: 0.96 mg/dL (ref 0.44–1.00)
GFR, Estimated: >60 mL/min (ref >60)
Glucose, Bld: 161 mg/dL — ABNORMAL HIGH (ref 70–99)
Potassium: 4 mmol/L (ref 3.5–5.1)
Sodium: 136 mmol/L (ref 135–145)
Total Bilirubin: 0.6 mg/dL (ref 0.3–1.2)
Total Protein: 5.4 g/dL — ABNORMAL LOW (ref 6.5–8.1)

## 2021-11-20 LAB — URINALYSIS, ROUTINE W REFLEX MICROSCOPIC
Bilirubin Urine: NEGATIVE
Glucose, UA: NEGATIVE mg/dL
Ketones, ur: NEGATIVE mg/dL
Nitrite: POSITIVE — AB
Protein, ur: NEGATIVE mg/dL
Specific Gravity, Urine: 1.015 (ref 1.005–1.030)
pH: 6 (ref 5.0–8.0)

## 2021-11-20 LAB — RESP PANEL BY RT-PCR (FLU A&B, COVID) ARPGX2
Influenza A by PCR: NEGATIVE
Influenza B by PCR: NEGATIVE
SARS Coronavirus 2 by RT PCR: NEGATIVE

## 2021-11-20 LAB — APTT: aPTT: 28 seconds (ref 24–36)

## 2021-11-20 LAB — URINALYSIS, MICROSCOPIC (REFLEX): RBC / HPF: NONE SEEN RBC/hpf (ref 0–5)

## 2021-11-20 LAB — PROTIME-INR
INR: 1.3 — ABNORMAL HIGH (ref 0.8–1.2)
Prothrombin Time: 16 seconds — ABNORMAL HIGH (ref 11.4–15.2)

## 2021-11-20 LAB — LACTIC ACID, PLASMA
Lactic Acid, Venous: 2.6 mmol/L (ref 0.5–1.9)
Lactic Acid, Venous: 1.7 mmol/L (ref 0.5–1.9)

## 2021-11-20 MED ORDER — SODIUM CHLORIDE 0.9 % IV BOLUS
1000.0000 mL | Freq: Once | INTRAVENOUS | Status: AC
  Administered 2021-11-20: 1000 mL via INTRAVENOUS

## 2021-11-20 MED ORDER — DEXAMETHASONE SODIUM PHOSPHATE 10 MG/ML IJ SOLN
10.0000 mg | Freq: Once | INTRAMUSCULAR | Status: DC
Start: 2021-11-20 — End: 2021-11-20
  Filled 2021-11-20: qty 1

## 2021-11-20 MED ORDER — ACETAMINOPHEN 325 MG PO TABS
650.0000 mg | ORAL_TABLET | Freq: Four times a day (QID) | ORAL | Status: DC | PRN
  Administered 2021-11-21 – 2021-11-29 (×15): 650 mg via ORAL
  Filled 2021-11-20 (×15): qty 2

## 2021-11-20 MED ORDER — HYDROCHLOROTHIAZIDE 12.5 MG PO TABS
12.5000 mg | ORAL_TABLET | Freq: Every day | ORAL | Status: DC
  Administered 2021-11-21 – 2021-11-25 (×5): 12.5 mg via ORAL
  Filled 2021-11-20 (×5): qty 1

## 2021-11-20 MED ORDER — FAMOTIDINE IN NACL 20-0.9 MG/50ML-% IV SOLN
20.0000 mg | Freq: Once | INTRAVENOUS | Status: DC
  Filled 2021-11-20: qty 50

## 2021-11-20 MED ORDER — SODIUM CHLORIDE 0.9 % IV SOLN: 2.0000 g | INTRAVENOUS | Status: DC

## 2021-11-20 MED ORDER — ENOXAPARIN SODIUM 30 MG/0.3ML IJ SOSY
30.0000 mg | PREFILLED_SYRINGE | INTRAMUSCULAR | Status: DC
  Administered 2021-11-21 – 2021-11-29 (×8): 30 mg via SUBCUTANEOUS
  Filled 2021-11-20 (×8): qty 0.3

## 2021-11-20 MED ORDER — IRBESARTAN 300 MG PO TABS
300.0000 mg | ORAL_TABLET | Freq: Every day | ORAL | Status: DC
  Administered 2021-11-21 – 2021-11-29 (×8): 300 mg via ORAL
  Filled 2021-11-20 (×9): qty 1

## 2021-11-20 MED ORDER — CIPROFLOXACIN IN D5W 400 MG/200ML IV SOLN
400.0000 mg | Freq: Once | INTRAVENOUS | Status: AC
  Administered 2021-11-20: 400 mg via INTRAVENOUS
  Filled 2021-11-20: qty 200

## 2021-11-20 MED ORDER — DULOXETINE HCL 30 MG PO CPEP
30.0000 mg | ORAL_CAPSULE | Freq: Every day | ORAL | Status: DC
  Administered 2021-11-21 – 2021-11-29 (×8): 30 mg via ORAL
  Filled 2021-11-20 (×8): qty 1

## 2021-11-20 MED ORDER — ROSUVASTATIN CALCIUM 20 MG PO TABS
20.0000 mg | ORAL_TABLET | Freq: Every day | ORAL | Status: DC
  Administered 2021-11-21 – 2021-11-29 (×8): 20 mg via ORAL
  Filled 2021-11-20 (×8): qty 1

## 2021-11-20 MED ORDER — KETOROLAC TROMETHAMINE 15 MG/ML IJ SOLN
15.0000 mg | Freq: Once | INTRAMUSCULAR | Status: AC
Start: 2021-11-20 — End: 2021-11-20
  Administered 2021-11-20: 15 mg via INTRAMUSCULAR
  Filled 2021-11-20: qty 1

## 2021-11-20 MED ORDER — CIPROFLOXACIN IN D5W 400 MG/200ML IV SOLN
400.0000 mg | Freq: Two times a day (BID) | INTRAVENOUS | Status: DC
  Administered 2021-11-21 – 2021-11-23 (×5): 400 mg via INTRAVENOUS
  Filled 2021-11-20 (×7): qty 200

## 2021-11-20 MED ORDER — LACTATED RINGERS IV SOLN: INTRAVENOUS | Status: AC

## 2021-11-20 MED ORDER — DIPHENHYDRAMINE HCL 50 MG/ML IJ SOLN
25.0000 mg | Freq: Once | INTRAMUSCULAR | Status: DC
  Filled 2021-11-20: qty 1

## 2021-11-20 MED ORDER — DEXAMETHASONE SODIUM PHOSPHATE 10 MG/ML IJ SOLN
10.0000 mg | Freq: Once | INTRAMUSCULAR | Status: AC
Start: 2021-11-20 — End: 2021-11-20
  Administered 2021-11-20: 10 mg via INTRAMUSCULAR

## 2021-11-20 MED ORDER — FAMOTIDINE 20 MG PO TABS
20.0000 mg | ORAL_TABLET | ORAL | Status: AC
Start: 2021-11-20 — End: 2021-11-20
  Administered 2021-11-20: 20 mg via ORAL
  Filled 2021-11-20: qty 1

## 2021-11-20 MED ORDER — ONDANSETRON HCL 4 MG/2ML IJ SOLN: 4.0000 mg | Freq: Four times a day (QID) | INTRAMUSCULAR | Status: DC | PRN

## 2021-11-20 MED ORDER — MORPHINE SULFATE (PF) 4 MG/ML IV SOLN
4.0000 mg | Freq: Once | INTRAVENOUS | Status: AC
  Administered 2021-11-20: 4 mg via INTRAVENOUS
  Filled 2021-11-20: qty 1

## 2021-11-20 MED ORDER — ACETAMINOPHEN 650 MG RE SUPP: 650.0000 mg | Freq: Four times a day (QID) | RECTAL | Status: DC | PRN

## 2021-11-20 MED ORDER — ONDANSETRON HCL 4 MG PO TABS: 4.0000 mg | ORAL_TABLET | Freq: Four times a day (QID) | ORAL | Status: DC | PRN

## 2021-11-20 MED ORDER — DIPHENHYDRAMINE HCL 25 MG PO CAPS
25.0000 mg | ORAL_CAPSULE | ORAL | Status: AC
  Administered 2021-11-20: 25 mg via ORAL
  Filled 2021-11-20: qty 1

## 2021-11-20 MED ORDER — LACTATED RINGERS IV BOLUS (SEPSIS)
500.0000 mL | Freq: Once | INTRAVENOUS | Status: AC
  Administered 2021-11-20: 500 mL via INTRAVENOUS

## 2021-11-20 MED ORDER — OXYCODONE HCL 5 MG PO TABS
5.0000 mg | ORAL_TABLET | Freq: Four times a day (QID) | ORAL | Status: DC | PRN
  Administered 2021-11-21 – 2021-11-29 (×26): 5 mg via ORAL
  Filled 2021-11-20 (×26): qty 1

## 2021-11-20 MED ORDER — KETOROLAC TROMETHAMINE 15 MG/ML IJ SOLN
15.0000 mg | Freq: Once | INTRAMUSCULAR | Status: DC
  Filled 2021-11-20: qty 1

## 2021-11-20 NOTE — ED Notes (Signed)
RN unable to get repeat lactic acid, phlebotomy to attempt. Laveda Norman PA aware

## 2021-11-20 NOTE — Progress Notes (Signed)
Pharmacy Antibiotic Note  Leah Rose is a 51 y.o. female admitted on 11/20/2021 with UTI.  Pharmacy has been consulted for Cipro dosing. Patient with documented allergy to penicillin. Tried oral keflex yesterday and patient reacted with hives and itching.  Plan: Cipro 400mg  IV q12 hours Vancomycin 1000mg  IV x1 then 750mg  IV q24 hours (524 eAUC )     Temp (24hrs), Avg:99.7 F (37.6 C), Min:98.2 F (36.8 C), Max:101.6 F (38.7 C)  Recent Labs  Lab 11/19/21 0900 11/20/21 1510 11/20/21 1705 11/20/21 1744 11/20/21 2109  WBC 13.9* 9.4  --   --   --   CREATININE 0.57  --  0.96  --   --   LATICACIDVEN 1.3  --   --  2.6* 1.7    Estimated Creatinine Clearance: 46.8 mL/min (by C-G formula based on SCr of 0.96 mg/dL).    Allergies  Allergen Reactions   Cephalexin Hives, Itching and Swelling   Penicillins Itching   Sulfonamide Derivatives Itching and Rash    Antimicrobials this admission: Cipro 8/19>> Vancomycin 8/20>>  Microbiology results: pending  Thank you for allowing pharmacy to be a part of this patient's care.  11/22/21 Leah Rose 11/20/2021 11:19 PM

## 2021-11-20 NOTE — ED Triage Notes (Signed)
Patient arrived by [facility] with complaint of possible allergic reaction after being diagnosed with UTI yesterday and starting keflex, zofran and norco. Awoke during night with itching and hives and again this am. Patient received EPI prior to arrival. EMS reports BP 80s. Patient with no hives noted on arrival, alert and oriented

## 2021-11-20 NOTE — H&P (Signed)
Date: 11/20/2021               Patient Name:  Leah Rose MRN: [id number]  DOB: 1971-03-27 Age / Sex: 51 y.o., female   PCP: Earl Lagos, MD         Medical Service: Internal Medicine Teaching Service         Attending Physician: Dr. Gust Rung, DO    First Contact: Dr. Aundria Rud Pager: [phone]  Second Contact: Dr. Marijo Conception Pager: [phone]       After Hours (After 5p/  First Contact Pager: [phone]  weekends / holidays): Second Contact Pager: [phone]   Chief Complaint: Allergic Reaction  History of Present Illness: [name] is a 51 year old female with a past medical history of tobacco use, hypertension, depression, TIA, and chronic venous insufficiency who presents for urosepsis complicated by allergic reaction to cephalexin. Yesterday 8-18 morning, she sought medical attention at the [facility] ED for severe left-sided flank pain where she was diagnosed with a UTI and discharged with cephalexin and ondansetron. Despite these medications, her flank pain persisted after she returned home. She also reports significant loss of appetite, facial swelling, itchiness involving her arms and legs, and a rash on the back of her left leg. She denies experiencing any concurrent urinary frequency, dysuria, nausea, fever, and shortness of breath.  She reports significantly reduced appetite throughout this period, avoiding food and drink. The intensity of her flank pain worsened early this morning 8-19, which prompted her to return to the ED. Upon arrival, she was afebrile and tachycardic with BP 95/69. She states that she hardly ate or drank anything over the last day. Dexamethasone, diphenhydramine, famotidine, ketorolac, and morphine were administered. She responded well to intravenous fluids given per sepsis protocol with BP returning to 120s-140s systolic and her temperature peaked at 101.6 measured rectally.    Meds:  No current facility-administered medications on file prior to  encounter.   Current Outpatient Medications on File Prior to Encounter  Medication Sig Dispense Refill   cephALEXin (KEFLEX) 500 MG capsule Take 1 capsule (500 mg total) by mouth 4 (four) times daily. 28 capsule 0   DULoxetine (CYMBALTA) 30 MG capsule Take 1 capsule (30 mg total) by mouth daily for 7 days, THEN 2 capsules (60 mg total) daily. 67 capsule 0   hydrochlorothiazide (HYDRODIURIL) 12.5 MG tablet Take 1 tablet by mouth once daily 90 tablet 0   HYDROcodone-acetaminophen (NORCO/VICODIN) 5-325 MG tablet Take 1 tablet by mouth every 6 (six) hours as needed. 8 tablet 0   olmesartan (BENICAR) 40 MG tablet Take 1 tablet (40 mg total) by mouth daily. 90 tablet 3   ondansetron (ZOFRAN) 4 MG tablet Take 1 tablet (4 mg total) by mouth every 6 (six) hours. 12 tablet 0   rosuvastatin (CRESTOR) 20 MG tablet Take 1 tablet (20 mg total) by mouth daily. 90 tablet 0    No outpatient medications have been marked as taking for the 11/20/21 encounter [facility] Encounter).     Allergies: Allergies as of 11/20/2021 - Review Complete 11/20/2021  Allergen Reaction Noted   Penicillins Itching    Sulfonamide derivatives Itching and Rash    Past Medical History:  Diagnosis Date   Chronic venous insufficiency 06/23/2017   Essential hypertension 03/14/2006   Hypertensive emergency 07/15/2020   Hypokalemia 07/15/2020   Moderate recurrent major depression (HCC) 03/14/2006   Tobacco abuse 03/14/2006    Family History:  Family History  Problem Relation Age of Onset  Seizures Mother    Cirrhosis Mother    Birth defects Sister    Hypertension Sister    Breast cancer Maternal Grandmother    Thyroid disease Sister      Social History:  Social History   Socioeconomic History   Marital status: Single    Spouse name: Not on file   Number of children: 1   Years of education: 12   Highest education level: Not on file  Occupational History   Occupation: Chief Operating Officer    Comment:  Production manager [employer]  Tobacco Use   Smoking status: Every Day    Packs/day: 0.50    Types: Cigarettes   Smokeless tobacco: Never   Tobacco comments:    less sometimes.   Vaping Use   Vaping Use: Never used  Substance and Sexual Activity   Alcohol use: Yes    Alcohol/week: 14.0 standard drinks of alcohol    Types: 14 Cans of beer per week   Drug use: No   Sexual activity: Not on file  Other Topics Concern   Not on file  Social History Narrative   Lives with her "old man" and a cat in [place]   Social Determinants of Health   Financial Resource Strain: Not on file  Food Insecurity: Food Insecurity Present (08/25/2021)   Hunger Vital Sign    Worried About Running Out of Food in the Last Year: Sometimes true    Ran Out of Food in the Last Year: Sometimes true  Transportation Needs: Unmet Transportation Needs (08/25/2021)   PRAPARE - Administrator, Civil Service (Medical): No    Lack of Transportation (Non-Medical): Yes  Physical Activity: Not on file  Stress: Not on file  Social Connections: Not on file  Intimate Partner Violence: Not on file     Review of Systems: A complete ROS was negative except as per HPI.  Physical Exam: Blood pressure (!) 144/90, pulse (!) 102, temperature (!) 101.6 F (38.7 C), temperature source Rectal, resp. rate (!) 23, SpO2 98 %. General:   awake and alert, lying in hospital bed, cooperative, not in acute distress Skin:   warm and dry, no rashes or urticaria Lungs:   normal respiratory effort, breathing unlabored, symmetrical chest rise, no crackles or wheezing Cardiac:   tachycardic with regular rhythm, normal S1 and S2, extremity pulses intact bilaterally, no pitting edema Abdomen:   slightly distended, normoactive bowel sounds present in all four quadrants, no guarding or tenderness to palpation, costovertebral tenderness present on left Psychiatric:   mood and affect normal, intelligible speech  EKG:  personally reviewed my interpretation is sinus tachycardia  CXR: personally reviewed my interpretation is mild cardiomegaly, otherwise negative, no acute signs of infection or pulmonary edema   Assessment & Plan by Problem: Principal Problem:   Sepsis secondary to UTI (HCC)   Sepsis secondary to UTI Patient presented 8-19 meeting sepsis criteria given leukocytosis, tachycardia, tachypnea, hypotension, fever, and elevated lactate. Urinalysis showed positive nitrites and many bacteria concerning for a UTI. She developed an allergic reaction to cephalexin, so some of these symptoms could have been the result of sepsis. She responded well to diphenhydramine and dexamethazone. It appears that the allergic reaction has now resolved, but given persistence of abnormal vitals and labs, sepsis secondary to UTI is likely. Her hypotension has resolved with IV fluids, will continue IV antibiotics and fluids until she tolerates per os. We will manage pain with Tylenol and oxycodone pro re  nata. > Start 5-day course of ciprofloxacin per pharmacy, transition to oral when able > Repeat lactate, has cleared > Oxycodone and Tylenol for pain control > Continue IV fluids, encourage PO intake once able   Allergic Reaction to cephalexin - prescribed cephalexin for UTI, developed urticaria and facial swelling - hypotensive upon arrival to ED, responded well to IV fluids - given dexamethazone, ketorolac, and diphenhydramine in the ED - significant symptomatic improvement - exam negative for urticaria, facial swelling, shortness of breath - allergic to penicillin, sulfonamides, and cephalexin > Continue to monitor for respiratory decline, SOB, urticaria   Hypertension Hyperlipidemia TIA - TIA in 2022 suspected secondary to uncontrolled hypertension > Continue aspirin > Continue olmesartan and hydrochlorothiazide > Continue rosuvastatin   Diabetes-II - controlled with diet - last A1C 5.7 in 06-2021 >  hold off on getting A1C at this time   Chronic Venous Insufficiency - managed conservatively with leg elevation and compression stockings > Continue to monitor, currently stable   Depression - major depressive disorder managed pharmacologically > Continue home duloxetine    Dispo: Admit patient to Observation with expected length of stay less than 2 midnights.  Signed: Crissie Sickles, MD 11/20/2021, 10:33 PM  Pager: [phone] After 5pm on weekdays and 1pm on weekends: On Call pager: [phone]

## 2021-11-20 NOTE — ED Notes (Signed)
RN unable to obtain 2nd set of blood cultures, Kommor MD aware

## 2021-11-20 NOTE — ED Provider Notes (Signed)
[facility] EMERGENCY DEPARTMENT Provider Note   CSN: [id number] Arrival date & time: 11/20/21  1219     History No chief complaint on file.   Leah Rose is a 51 y.o. female with medical history of tobacco abuse, hypertension, major depression, chronic venous insufficiency.  Patient presents to ED for evaluation of supposed allergic reaction.  Patient reports that yesterday she was in the hospital, was diagnosed with UTI and started on Keflex.  The patient states that while in the hospital she received IV antibiotics however unable to see a record of this on chart review.  The patient states that she was discharged home on Keflex, Norco, Zofran.  Patient reports that when she got home yesterday she took 2 doses of Keflex and then began having generalized itching and hives to her upper extremities, trunk.  The patient reports that she also had what she felt like was swelling in her face.  The patient denies any trouble swallowing, throat closure, nausea, vomiting, abdominal pain, diarrhea.  The patient states that she has allergies to sulfa, penicillin antibiotics.  Patient reports that when she called EMS, they arrived provided her with 0.3 mg of epinephrine.  HPI     Home Medications Prior to Admission medications   Medication Sig Start Date End Date Taking? Authorizing Provider  cephALEXin (KEFLEX) 500 MG capsule Take 1 capsule (500 mg total) by mouth 4 (four) times daily. 11/19/21   Wynetta Fines, MD  DULoxetine (CYMBALTA) 30 MG capsule Take 1 capsule (30 mg total) by mouth daily for 7 days, THEN 2 capsules (60 mg total) daily. 07/21/21 08/27/21  Eliezer Bottom, MD  hydrochlorothiazide (HYDRODIURIL) 12.5 MG tablet Take 1 tablet by mouth once daily 05/31/21   Earl Lagos, MD  HYDROcodone-acetaminophen (NORCO/VICODIN) 5-325 MG tablet Take 1 tablet by mouth every 6 (six) hours as needed. 11/19/21   Wynetta Fines, MD  olmesartan (BENICAR) 40 MG tablet Take 1 tablet  (40 mg total) by mouth daily. 07/21/21 07/21/22  Eliezer Bottom, MD  ondansetron (ZOFRAN) 4 MG tablet Take 1 tablet (4 mg total) by mouth every 6 (six) hours. 11/19/21   Wynetta Fines, MD  rosuvastatin (CRESTOR) 20 MG tablet Take 1 tablet (20 mg total) by mouth daily. 05/31/21   Earl Lagos, MD      Allergies    Penicillins and Sulfonamide derivatives    Review of Systems   Review of Systems  Constitutional:  Positive for diaphoresis. Negative for fever.  HENT:  Positive for facial swelling and trouble swallowing.   Respiratory:  Negative for shortness of breath.   Cardiovascular:  Negative for chest pain.  Gastrointestinal:  Negative for abdominal pain, diarrhea, nausea and vomiting.  Skin:  Positive for rash.  All other systems reviewed and are negative.   Physical Exam Updated Vital Signs BP 105/76   Pulse (!) 107   Temp 99.3 F (37.4 C) (Oral)   Resp (!) 26   SpO2 99%  Physical Exam Vitals and nursing note reviewed.  Constitutional:      General: She is not in acute distress.    Appearance: Normal appearance. She is not ill-appearing, toxic-appearing or diaphoretic.  HENT:     Head: Normocephalic and atraumatic.     Nose: Nose normal. No congestion.     Mouth/Throat:     Mouth: Mucous membranes are moist.     Pharynx: Oropharynx is clear.  Eyes:     Extraocular Movements: Extraocular movements intact.  Conjunctiva/sclera: Conjunctivae normal.     Pupils: Pupils are equal, round, and reactive to light.  Cardiovascular:     Rate and Rhythm: Normal rate and regular rhythm.  Pulmonary:     Effort: Pulmonary effort is normal.     Breath sounds: Normal breath sounds. No wheezing.  Abdominal:     General: Abdomen is flat. Bowel sounds are normal.     Palpations: Abdomen is soft.     Tenderness: There is no abdominal tenderness.  Musculoskeletal:     Cervical back: Normal range of motion and neck supple. No tenderness.  Skin:    General: Skin is warm and dry.      Capillary Refill: Capillary refill takes less than 2 seconds.  Neurological:     Mental Status: She is alert and oriented to person, place, and time.     GCS: GCS eye subscore is 4. GCS verbal subscore is 5. GCS motor subscore is 6.     Cranial Nerves: Cranial nerves 2-12 are intact. No cranial nerve deficit.     Sensory: Sensation is intact. No sensory deficit.     Motor: Motor function is intact. No weakness.     Coordination: Coordination is intact. Heel to [facility] Test normal.     ED Results / Procedures / Treatments   Labs (all labs ordered are listed, but only abnormal results are displayed) Labs Reviewed  CBC WITH DIFFERENTIAL/PLATELET  COMPREHENSIVE METABOLIC PANEL    EKG None  Radiology CT ABDOMEN PELVIS WO CONTRAST  Result Date: 11/19/2021 CLINICAL DATA:  Flank pain.  Evaluate for kidney stone. EXAM: CT ABDOMEN AND PELVIS WITHOUT CONTRAST TECHNIQUE: Multidetector CT imaging of the abdomen and pelvis was performed following the standard protocol without IV contrast. RADIATION DOSE REDUCTION: This exam was performed according to the departmental dose-optimization program which includes automated exposure control, adjustment of the mA and/or kV according to patient size and/or use of iterative reconstruction technique. COMPARISON:  None Available. FINDINGS: Lower chest: No acute abnormality. Hepatobiliary: No focal liver abnormality is seen. No gallstones, gallbladder wall thickening, or biliary dilatation. Pancreas: Unremarkable. No pancreatic ductal dilatation or surrounding inflammatory changes. Spleen: Normal in size without focal abnormality. Adrenals/Urinary Tract: Normal adrenal glands. No nephrolithiasis, hydronephrosis or mass identified bilaterally. No hydroureter or ureteral lithiasis. Urinary bladder is unremarkable. Stomach/Bowel: Small hiatal hernia. Stomach otherwise unremarkable. The appendix is visualized and appears normal. No bowel wall thickening, inflammation,  or distension. Vascular/Lymphatic: No significant vascular findings are present. No enlarged abdominal or pelvic lymph nodes. Reproductive: Uterus and bilateral adnexa are unremarkable. Other: No free fluid or fluid collections identified. No signs of pneumoperitoneum. No ventral abdominal wall hernia identified. Musculoskeletal: No acute or suspicious osseous findings. Bilateral L5-S1 facet arthropathy. IMPRESSION: 1. No acute findings within the abdomen or pelvis. No signs of nephrolithiasis or hydronephrosis. 2. Small hiatal hernia. Electronically Signed   By: Signa Kell M.D.   On: 11/19/2021 09:53    Procedures Procedures   Medications Ordered in ED Medications  sodium chloride 0.9 % bolus 1,000 mL (has no administration in time range)  dexamethasone (DECADRON) injection 10 mg (10 mg Intramuscular Given 11/20/21 1431)  diphenhydrAMINE (BENADRYL) capsule 25 mg (25 mg Oral Given 11/20/21 1430)  famotidine (PEPCID) tablet 20 mg (20 mg Oral Given 11/20/21 1430)  ketorolac (TORADOL) 15 MG/ML injection 15 mg (15 mg Intramuscular Given 11/20/21 1431)    ED Course/ Medical Decision Making/ A&P  Medical Decision Making Amount and/or Complexity of Data Reviewed Labs: ordered.  Risk Prescription drug management.   51 year old female presents to ED for evaluation of allergic reaction.  Please see HPI for further details.  On examination the patient is afebrile and nontachycardic.  Patient lung sounds are clear bilaterally, she is not hypoxic on room air.  Patient abdomen soft and compressible all 4 quadrants.  Patient neurological examination shows no focal neurodeficits.  The patient does not have any appearance of urticaria/hives.  Patient nontoxic in appearance.  Patient handling secretions appropriately.  Patient worked up utilizing the following labs and imaging studies interpreted by me personally: - CBC pending - CMP pending  Patient treated with 10 mg IM  Decadron shot, 15 mg IM Toradol shot for pain, 25 mg Benadryl capsule, 20 mg famotidine.  On reassessment after medications were given, the patient states that she feels better.  Patient was hard stick, IV team consult has been placed and is pending at this time.  Plan is to give this patient IV fluids, monitor for any recurrence of allergic reaction.  The patient will be signed out to [facility] for observation.  PA Greta Doom updated on plan of management.  Final Clinical Impression(s) / ED Diagnoses Final diagnoses:  Allergic reaction, initial encounter    Rx / DC Orders ED Discharge Orders     None         Clent Ridges 11/20/21 1526    Gloris Manchester, MD 11/22/21 1454

## 2021-11-20 NOTE — ED Provider Notes (Signed)
Received signout from previous provider, please see his note for complete H&P.  This is a 51 year old female presenting with concerns of allergic reaction.  Patient was seen in ED yesterday with complaints of left-sided flank pain that started 2 days ago.  Work-up was remarkable for possible urinary tract infection.  She also has elevated white count of 13.  She had a CT scan of abdomen pelvis which did not show any acute finding.  She has normal lactic acid at that time.  Patient was given Keflex as treatment for suspected UTI.  She was also given opiate pain medication.  Patient returns today with complaints of hives after taking Keflex.  Previous provider did provide patient with treatment for allergic reaction which includes prednisone, Benadryl, Pepcid as well as Toradol.  Patient was a difficult IV access, successful IV insertion using ultrasound-guided technique by me.  Note the patient is currently tachycardic, soft blood pressure of 105/76, and oral temperature of 99.3.  She is mildly tachypneic at 26 but oxygen level is 99% on room air.  Will check rectal temp, give IV fluid patient  At this time no obvious urticaria appreciated.  Lungs are clear on auscultation, no lips or tongue swelling and no airway compromise.  EMERGENCY DEPARTMENT  US GUIDANCE EXAM Emergency Ultrasound:  US Guidance for Needle Guidance  INDICATIONS: Difficult vascular access Linear probe used in real-time to visualize location of needle entry through skin.   PERFORMED BY: Myself IMAGES ARCHIVED?: No LIMITATIONS: Pain VIEWS USED: Transverse INTERPRETATION: Right arm and Needle gauge 20  4:22 PM Please note on my initial exam patient appears very drowsy.  This could be resultant of Benadryl however I am concerned for potential delirium from sepsis.  Patient felt warm to the touch.  I have requested for rectal temperature.  Rectal temp obtained with a fever of 101.6.  Heart rate is in the 120s.  I have initiated  code sepsis, will give IV fluid at 30 mill per kilogram and will also initiate antibiotic which include ciprofloxacin as patient is allergic to penicillin, Sulfamethoxazole and to cephalosporin.  7:40 PM Although her white count did normalize her lactic acid is 2.6.  Despite receiving several liters of IV fluid, patient remains tachycardic.  Please note patient's pain is primarily to her left flank likely reflect pyelonephritis.  She does not have any significant midline spine tenderness no history of IV drug use or active cancer to suggest osteomyelitis, discitis or epidural abscess.  She does not have any focal neurodeficit on my exam.  On reassessment patient stated her pain has returned, will provide additional opiate pain medication will consult for admission.  8:30 PM Appreciate consultation from internal medicine resident who agrees to see and will admit patient for further management of her urosepsis.  This patient presents to the ED for concern of left flank pain, this involves an extensive number of treatment options, and is a complaint that carries with it a high risk of complications and morbidity.  The differential diagnosis includes pyelonephritis, infected kidney stone, diverticulitis, dissection, epidural abscess, pancreatitis, shingle, msk  Co morbidities that complicate the patient evaluation DM HTN Additional history obtained:  Additional history obtained from daughter External records from outside source obtained and reviewed including EMR including prior labs and imaging  Lab Tests:  I Ordered, and personally interpreted labs.  The pertinent results include:  as above  Imaging Studies ordered:  I ordered imaging studies including abd/pelvis CT from yesterday  I independently visualized  and interpreted imaging which showed no acute finding I agree with the radiologist interpretation  Cardiac Monitoring:  The patient was maintained on a cardiac monitor.  I personally  viewed and interpreted the cardiac monitored which showed an underlying rhythm of: sinus tachycardia  Medicines ordered and prescription drug management:  I ordered medication including cipro  for urosepsis Reevaluation of the patient after these medicines showed that the patient improved I have reviewed the patients home medicines and have made adjustments as needed  Test Considered: as above  Critical Interventions: sepsis protocol  Consultations Obtained:  I requested consultation with the internal medicine resident,  and discussed lab and imaging findings as well as pertinent plan - they recommend: admission  Problem List / ED Course: urosepsis  Reevaluation:  After the interventions noted above, I reevaluated the patient and found that they have :improved  Social Determinants of Health: tobacco abuse  Food insecurity  Depression  Lack of transportation  Dispostion:  After consideration of the diagnostic results and the patients response to treatment, I feel that the patent would benefit from admission.  .Critical Care  Performed by: Fayrene Helper, PA-C Authorized by: Fayrene Helper, PA-C   Critical care provider statement:    Critical care time (minutes):  30   Critical care was time spent personally by me on the following activities:  Development of treatment plan with patient or surrogate, discussions with consultants, evaluation of patient's response to treatment, examination of patient, ordering and review of laboratory studies, ordering and review of radiographic studies, ordering and performing treatments and interventions, pulse oximetry, re-evaluation of patient's condition and review of old charts     Fayrene Helper, Cordelia Poche 11/20/21 2034    Glendora Score, MD 11/21/21 2127

## 2021-11-20 NOTE — Sepsis Progress Note (Signed)
Elink following code sepsis °

## 2021-11-20 NOTE — Hospital Course (Addendum)
Leah Rose is 51yo with tobacco use disorder, chronic venous insufficiency, hypertension, TIA admitted 8/19 for cephalosporin allergic reaction while treating E coli UTI and found to have MRSA bacteremia.  MRSA bacteremia E. coli urinary tract infection This is a 51 year old female who presented for some swelling in her face after receiving treatment for suspected pyelonephritis with cephalexin.  In the emergency department she was noted to be febrile tachycardic and tachypnea, laboratory showed an elevated lactic acid at 2.6 and wbc up to 14.6K.  Therefore there was concern for sepsis with acute organ dysfunction secondary to pyelonephritis.Only 1 set of blood cultures initially drawn and positive for MRSA bacteremia in the setting of a recently ruptured boil on the R arm. The patient was treated with IV vanc starting 8/19. TTE and TEE were negative.Repeat blood cultures were without growth. ID followed the patient throughout and given uncomplicated MRSA bacteremia recommend 14 day total MRSA coverage with IV vanc 8/20-8/27, and oral linezolid 8/28-12/04/2021. At discharge the patients lactate cleared, WBC was 10.6K, the patient was afebrile, normotensive without tachypnea or tachycardia.   E coli urinary tract infection The patient was found to have UA compatible with E coli UTI with no CVA tenderness and no intraabdominal infection on CT A/P non-contrast at admission. This retrospectively appears to be asymptomatic bacteruria but was treated given how sick she was with an unclear etiology and new onset L flank pain with bacteruria. Given persistent left flank pain she had repeat CT A/P w contrast and there were no signs of intra-abdominal or A/P MSK infection.She was initially treated with ciprofloxacin and was switched to cefazolin per ID given cipro resistance. She did not have any reaction to cefazolin and this was completed as a 7 day course for clinical pyelonephritis per ID, although again per the  medicine team this seemed consistent with asymptomatic bacteruria vs uncomplicated cystitis.    L flank pain Patient says this was an acute onset flank pain without trauma that coincided with feeling sick and presenting to the ED.  Some SI joint pain to palpation.No pain on hip movements. Extremely tight hamstring muscles.No radiation down the leg coming from the back to suggest radiculopathy. This may just be an acute pulled hamstring vs muscle spasms causing radiation into the pelvis/flank . This was coincidental and unrelated to bacteremia. Was given short course of narcotic pain meds at last ED visit and will continue short 5 day course at discharge.

## 2021-11-21 ENCOUNTER — Encounter (HOSPITAL_COMMUNITY): Payer: Self-pay | Admitting: Internal Medicine

## 2021-11-21 DIAGNOSIS — Z8673 Personal history of transient ischemic attack (TIA), and cerebral infarction without residual deficits: Secondary | ICD-10-CM | POA: Diagnosis not present

## 2021-11-21 DIAGNOSIS — N12 Tubulo-interstitial nephritis, not specified as acute or chronic: Secondary | ICD-10-CM | POA: Diagnosis present

## 2021-11-21 DIAGNOSIS — M62838 Other muscle spasm: Secondary | ICD-10-CM | POA: Diagnosis present

## 2021-11-21 DIAGNOSIS — F329 Major depressive disorder, single episode, unspecified: Secondary | ICD-10-CM | POA: Diagnosis present

## 2021-11-21 DIAGNOSIS — Z8249 Family history of ischemic heart disease and other diseases of the circulatory system: Secondary | ICD-10-CM | POA: Diagnosis not present

## 2021-11-21 DIAGNOSIS — E119 Type 2 diabetes mellitus without complications: Secondary | ICD-10-CM | POA: Diagnosis present

## 2021-11-21 DIAGNOSIS — L981 Factitial dermatitis: Secondary | ICD-10-CM | POA: Diagnosis not present

## 2021-11-21 DIAGNOSIS — R1032 Left lower quadrant pain: Secondary | ICD-10-CM

## 2021-11-21 DIAGNOSIS — B9620 Unspecified Escherichia coli [E. coli] as the cause of diseases classified elsewhere: Secondary | ICD-10-CM | POA: Diagnosis not present

## 2021-11-21 DIAGNOSIS — A4102 Sepsis due to Methicillin resistant Staphylococcus aureus: Secondary | ICD-10-CM | POA: Diagnosis not present

## 2021-11-21 DIAGNOSIS — E860 Dehydration: Secondary | ICD-10-CM | POA: Diagnosis present

## 2021-11-21 DIAGNOSIS — A4151 Sepsis due to Escherichia coli [E. coli]: Secondary | ICD-10-CM | POA: Diagnosis present

## 2021-11-21 DIAGNOSIS — N390 Urinary tract infection, site not specified: Secondary | ICD-10-CM | POA: Diagnosis not present

## 2021-11-21 DIAGNOSIS — E785 Hyperlipidemia, unspecified: Secondary | ICD-10-CM | POA: Diagnosis present

## 2021-11-21 DIAGNOSIS — Z880 Allergy status to penicillin: Secondary | ICD-10-CM | POA: Diagnosis not present

## 2021-11-21 DIAGNOSIS — L02423 Furuncle of right upper limb: Secondary | ICD-10-CM | POA: Diagnosis present

## 2021-11-21 DIAGNOSIS — Z1623 Resistance to quinolones and fluoroquinolones: Secondary | ICD-10-CM | POA: Diagnosis present

## 2021-11-21 DIAGNOSIS — I959 Hypotension, unspecified: Secondary | ICD-10-CM | POA: Diagnosis present

## 2021-11-21 DIAGNOSIS — N009 Acute nephritic syndrome with unspecified morphologic changes: Secondary | ICD-10-CM | POA: Diagnosis not present

## 2021-11-21 DIAGNOSIS — I071 Rheumatic tricuspid insufficiency: Secondary | ICD-10-CM | POA: Diagnosis not present

## 2021-11-21 DIAGNOSIS — R8271 Bacteriuria: Secondary | ICD-10-CM | POA: Diagnosis present

## 2021-11-21 DIAGNOSIS — M5459 Other low back pain: Secondary | ICD-10-CM | POA: Diagnosis not present

## 2021-11-21 DIAGNOSIS — N10 Acute tubulo-interstitial nephritis: Secondary | ICD-10-CM | POA: Diagnosis not present

## 2021-11-21 DIAGNOSIS — I10 Essential (primary) hypertension: Secondary | ICD-10-CM | POA: Diagnosis present

## 2021-11-21 DIAGNOSIS — Z888 Allergy status to other drugs, medicaments and biological substances status: Secondary | ICD-10-CM | POA: Diagnosis not present

## 2021-11-21 DIAGNOSIS — I38 Endocarditis, valve unspecified: Secondary | ICD-10-CM | POA: Diagnosis not present

## 2021-11-21 DIAGNOSIS — T7840XA Allergy, unspecified, initial encounter: Secondary | ICD-10-CM | POA: Insufficient documentation

## 2021-11-21 DIAGNOSIS — L209 Atopic dermatitis, unspecified: Secondary | ICD-10-CM

## 2021-11-21 DIAGNOSIS — G8929 Other chronic pain: Secondary | ICD-10-CM | POA: Diagnosis present

## 2021-11-21 DIAGNOSIS — F17200 Nicotine dependence, unspecified, uncomplicated: Secondary | ICD-10-CM | POA: Diagnosis not present

## 2021-11-21 DIAGNOSIS — A419 Sepsis, unspecified organism: Secondary | ICD-10-CM | POA: Diagnosis not present

## 2021-11-21 DIAGNOSIS — R7881 Bacteremia: Secondary | ICD-10-CM | POA: Diagnosis not present

## 2021-11-21 DIAGNOSIS — M545 Low back pain, unspecified: Secondary | ICD-10-CM | POA: Diagnosis present

## 2021-11-21 DIAGNOSIS — Z882 Allergy status to sulfonamides status: Secondary | ICD-10-CM | POA: Diagnosis not present

## 2021-11-21 DIAGNOSIS — B9562 Methicillin resistant Staphylococcus aureus infection as the cause of diseases classified elsewhere: Secondary | ICD-10-CM | POA: Diagnosis present

## 2021-11-21 DIAGNOSIS — Z20822 Contact with and (suspected) exposure to covid-19: Secondary | ICD-10-CM | POA: Diagnosis present

## 2021-11-21 DIAGNOSIS — N3000 Acute cystitis without hematuria: Secondary | ICD-10-CM | POA: Diagnosis present

## 2021-11-21 DIAGNOSIS — E871 Hypo-osmolality and hyponatremia: Secondary | ICD-10-CM | POA: Diagnosis not present

## 2021-11-21 DIAGNOSIS — K007 Teething syndrome: Secondary | ICD-10-CM | POA: Diagnosis present

## 2021-11-21 DIAGNOSIS — I872 Venous insufficiency (chronic) (peripheral): Secondary | ICD-10-CM | POA: Diagnosis present

## 2021-11-21 LAB — BLOOD CULTURE ID PANEL (REFLEXED) - BCID2

## 2021-11-21 LAB — CBG MONITORING, ED
Glucose-Capillary: 102 mg/dL — ABNORMAL HIGH (ref 70–99)
Glucose-Capillary: 111 mg/dL — ABNORMAL HIGH (ref 70–99)
Glucose-Capillary: 119 mg/dL — ABNORMAL HIGH (ref 70–99)

## 2021-11-21 MED ORDER — VANCOMYCIN HCL IN DEXTROSE 1-5 GM/200ML-% IV SOLN
1000.0000 mg | Freq: Once | INTRAVENOUS | Status: AC
  Administered 2021-11-21: 1000 mg via INTRAVENOUS
  Filled 2021-11-21 (×2): qty 200

## 2021-11-21 MED ORDER — TRIAMCINOLONE ACETONIDE 0.1 % EX CREA
TOPICAL_CREAM | Freq: Two times a day (BID) | CUTANEOUS | Status: DC
  Administered 2021-11-23: 1 via TOPICAL
  Filled 2021-11-21 (×2): qty 15

## 2021-11-21 MED ORDER — LIDOCAINE 5 % EX PTCH
1.0000 | MEDICATED_PATCH | CUTANEOUS | Status: DC
  Administered 2021-11-21 – 2021-11-28 (×6): 1 via TRANSDERMAL
  Filled 2021-11-21 (×8): qty 1

## 2021-11-21 MED ORDER — VANCOMYCIN HCL 750 MG/150ML IV SOLN
750.0000 mg | INTRAVENOUS | Status: DC
  Administered 2021-11-22 – 2021-11-24 (×3): 750 mg via INTRAVENOUS
  Filled 2021-11-21 (×3): qty 150

## 2021-11-21 NOTE — ED Notes (Signed)
Pt requested pain medication. RN informed pt it her medications is PRN q6h. Pt state she can wait.

## 2021-11-21 NOTE — Progress Notes (Signed)
PHARMACY - PHYSICIAN COMMUNICATION CRITICAL VALUE ALERT - BLOOD CULTURE IDENTIFICATION (BCID)  Leah Rose is an 51 y.o. female who presented to [facility] on 11/20/2021 with a chief complaint of urosepsis complicated by allergic reaction to cephalexin  Assessment:  2/2 (same set) blood cultures positive for MRSA   Name of physician (or Provider) Contacted: [place]  Current antibiotics: Vanc and Cipro  Changes to prescribed antibiotics recommended:  Repeat blood cultures and continue vancomycin Recommendations accepted by provider  Results for orders placed or performed during the hospital encounter of 11/20/21  Blood Culture ID Panel (Reflexed) (Collected: 11/20/2021  4:18 PM)  Result Value Ref Range   Enterococcus faecalis NOT DETECTED NOT DETECTED   Enterococcus Faecium NOT DETECTED NOT DETECTED   Listeria monocytogenes NOT DETECTED NOT DETECTED   Staphylococcus species DETECTED (A) NOT DETECTED   Staphylococcus aureus (BCID) DETECTED (A) NOT DETECTED   Staphylococcus epidermidis NOT DETECTED NOT DETECTED   Staphylococcus lugdunensis NOT DETECTED NOT DETECTED   Streptococcus species NOT DETECTED NOT DETECTED   Streptococcus agalactiae NOT DETECTED NOT DETECTED   Streptococcus pneumoniae NOT DETECTED NOT DETECTED   Streptococcus pyogenes NOT DETECTED NOT DETECTED   A.calcoaceticus-baumannii NOT DETECTED NOT DETECTED   Bacteroides fragilis NOT DETECTED NOT DETECTED   Enterobacterales NOT DETECTED NOT DETECTED   Enterobacter cloacae complex NOT DETECTED NOT DETECTED   Escherichia coli NOT DETECTED NOT DETECTED   Klebsiella aerogenes NOT DETECTED NOT DETECTED   Klebsiella oxytoca NOT DETECTED NOT DETECTED   Klebsiella pneumoniae NOT DETECTED NOT DETECTED   Proteus species NOT DETECTED NOT DETECTED   Salmonella species NOT DETECTED NOT DETECTED   Serratia marcescens NOT DETECTED NOT DETECTED   Haemophilus influenzae NOT DETECTED NOT DETECTED   Neisseria meningitidis NOT  DETECTED NOT DETECTED   Pseudomonas aeruginosa NOT DETECTED NOT DETECTED   Stenotrophomonas maltophilia NOT DETECTED NOT DETECTED   Candida albicans NOT DETECTED NOT DETECTED   Candida auris NOT DETECTED NOT DETECTED   Candida glabrata NOT DETECTED NOT DETECTED   Candida krusei NOT DETECTED NOT DETECTED   Candida parapsilosis NOT DETECTED NOT DETECTED   Candida tropicalis NOT DETECTED NOT DETECTED   Cryptococcus neoformans/gattii NOT DETECTED NOT DETECTED   Meth resistant mecA/C and MREJ DETECTED (A) NOT DETECTED    Tera Mater 11/21/2021  8:50 AM

## 2021-11-21 NOTE — ED Notes (Signed)
Pt states she has chronic back pain and would like some pain medication. RN notified MD.  Pt states she has been out of rosuvastatin for a while and would like a list of her medications before d/c

## 2021-11-21 NOTE — ED Notes (Signed)
Breakfast order placed ?

## 2021-11-21 NOTE — ED Notes (Addendum)
Pt stated she normally take 40 mg dose of Cymbalta or hydrochlorothiazide but unsure which one was increased. Pt states she takes a low dose aspirin as well. RN notified provider

## 2021-11-21 NOTE — ED Notes (Signed)
Pt was wheeled to restroom without incident

## 2021-11-21 NOTE — ED Notes (Signed)
Pt's IV pump alarming "occluded". RN unable to flush pt's [place] PIV. IV team order placed for new access.

## 2021-11-21 NOTE — ED Notes (Signed)
Pt is a hard stick. RN and Phlebotomy attempted morning labs. Rn attempted to get blood from IV line unsuccessful.

## 2021-11-21 NOTE — Progress Notes (Signed)
NAME:  Leah Rose, MRN:  [id number], DOB:  [birth year], LOS: 0 ADMISSION DATE:  11/20/2021  Subjective  Patient evaluated at bedside this AM. Reports she is feeling chilly, but otherwise okay. Mentions continued lower left back pain. Discussed plan for the day.  Objective   Blood pressure (!) 141/116, pulse (!) 103, temperature (!) 100.8 F (38.2 C), temperature source Oral, resp. rate (!) 24, height 4\' 11"  (1.499 m), weight 45.8 kg, SpO2 98 %.     Intake/Output Summary (Last 24 hours) at 11/21/2021 1223 Last data filed at 11/20/2021 2336 Gross per 24 hour  Intake 1195.97 ml  Output --  Net 1195.97 ml   Filed Weights   11/21/21 0901  Weight: 45.8 kg   Physical Exam: General: Resting comfortably in no acute distress CV: Regular rate, rhythm. No murmurs appreciated. Pulm: Normal work of breathing on room air. Clear to ausculation bilaterally Abdomen: Soft, non-tender, non-distended. Normoactive bowel sounds. Skin: Rough, scaling patch on L neck, no open wounds appreciated. No erythema, warmth, vesicles, bullae, satellite lesions. MSK: L flank pain on palpation. Normal bulk, tone.  Neuro: Awake, alert, conversing appropriately. Grossly non-focal  Labs       Latest Ref Rng & Units 11/20/2021    3:10 PM 11/19/2021    9:00 AM 06/23/2021   10:22 AM  CBC  WBC 4.0 - 10.5 K/uL 9.4  13.9  WILL FOLLOW  P  Hemoglobin 12.0 - 15.0 g/dL 06/25/2021  10.2  WILL FOLLOW  P  Hematocrit 36.0 - 46.0 % 42.7  39.7  WILL FOLLOW  P  Platelets 150 - 400 K/uL 280  273  WILL FOLLOW  P    P Preliminary result      Latest Ref Rng & Units 11/20/2021    5:05 PM 11/19/2021    9:00 AM 06/23/2021   10:22 AM  BMP  Glucose 70 - 99 mg/dL 06/25/2021  277  83   BUN 6 - 20 mg/dL 11  10  8    Creatinine 0.44 - 1.00 mg/dL 824   2.35   BUN/Creat Ratio 9 - 23   14   Sodium 135 - 145 mmol/L 136  140  142   Potassium 3.5 - 5.1 mmol/L 4.0  3.3  4.1   Chloride 98 - 111 mmol/L 110  111  107   CO2 22 - 32 mmol/L 19  23   19    Calcium 8.9 - 10.3 mg/dL 8.0  8.9  9.3    Summary   Leah Rose is 51yo with tobacco use disorder, chronic venous insufficiency, hypertension, TIA admitted 8/19 for sepsis secondary to urinary tract infection.   Assessment & Plan:  Principal Problem:   Sepsis secondary to UTI (HCC)  #Sepsis secondary to E. coli urinary tract infection #?MRSA bacteremia This morning, patient appeared to be doing well, hemodynamically stable on room air and mentating well. She received IV fluid resuscitation early this morning, lactic acid has cleared, 2.6>1.7. Urine culture from 8/18 revealing E. Coli, sensitivities pending. Blood cultures (only one set drawn) this morning growing MRSA. Unclear source - no recent dental procedures, no open wounds, although she does have small patch on neck that she has been itching. In addition, patient does not appear to be sick as one would expect with MRSA bacteremia. We have added vancomycin to her antibiotic regimen and will repeat cultures. If repeat cultures negative, will hold off Echo. For now, will continue with ciprofloxacin and vancomycin and follow-up cultures. - Continue  ciprofloxacin, vancomycin - Follow-up sensitivities from urine culture 8/18 - Follow-up repeat blood cultures - Avoid cephalosporins, penicillins, sulfonamides d/t allergies  #L flank pain Unclear source, possibly early pyelonephritis. CT abdomen/pelvis two days ago without acute abnormalities. We will try conservative management with lidocaine patch and continue to monitor. - Lidocaine patch  #Atopic dermatitis Pruritic patch on left side of neck most consistent with atopic dermatitis. She does wear a necklace regularly (her father's ashes), but this is not a new piece of jewelry. Contact dermatitis less likely. No vesicles, satellite lesions, bullae appreciated. Will provide topical corticosteroid cream for symptomatic relief. - Triamcinolone cream  Best practice:  DIET: CM IVF:  na DVT PPX: lovenox BOWEL: na CODE: FULL FAM COM: na  Evlyn Kanner, MD Internal Medicine Resident PGY-3 PAGER: [phone] 11/21/2021 12:23 PM  If after hours (below), please contact on-call pager: [phone] 5PM-7AM Monday-Friday 1PM-7AM Saturday-Sunday

## 2021-11-22 ENCOUNTER — Inpatient Hospital Stay (HOSPITAL_COMMUNITY): Payer: 59

## 2021-11-22 ENCOUNTER — Encounter (HOSPITAL_COMMUNITY): Payer: Self-pay | Admitting: Internal Medicine

## 2021-11-22 DIAGNOSIS — A419 Sepsis, unspecified organism: Secondary | ICD-10-CM | POA: Diagnosis not present

## 2021-11-22 DIAGNOSIS — N390 Urinary tract infection, site not specified: Secondary | ICD-10-CM | POA: Diagnosis not present

## 2021-11-22 DIAGNOSIS — B9620 Unspecified Escherichia coli [E. coli] as the cause of diseases classified elsewhere: Secondary | ICD-10-CM | POA: Diagnosis not present

## 2021-11-22 DIAGNOSIS — R7881 Bacteremia: Secondary | ICD-10-CM

## 2021-11-22 DIAGNOSIS — L981 Factitial dermatitis: Secondary | ICD-10-CM | POA: Diagnosis not present

## 2021-11-22 DIAGNOSIS — B9562 Methicillin resistant Staphylococcus aureus infection as the cause of diseases classified elsewhere: Secondary | ICD-10-CM

## 2021-11-22 DIAGNOSIS — A4102 Sepsis due to Methicillin resistant Staphylococcus aureus: Secondary | ICD-10-CM | POA: Diagnosis not present

## 2021-11-22 LAB — BASIC METABOLIC PANEL
Anion gap: 10 (ref 5–15)
BUN: 5 mg/dL — ABNORMAL LOW (ref 6–20)
CO2: 21 mmol/L — ABNORMAL LOW (ref 22–32)
Calcium: 8.4 mg/dL — ABNORMAL LOW (ref 8.9–10.3)
Chloride: 102 mmol/L (ref 98–111)
Creatinine, Ser: 0.64 mg/dL (ref 0.44–1.00)
GFR, Estimated: >60 mL/min (ref >60)
Glucose, Bld: 96 mg/dL (ref 70–99)
Potassium: 3.6 mmol/L (ref 3.5–5.1)
Sodium: 133 mmol/L — ABNORMAL LOW (ref 135–145)

## 2021-11-22 LAB — CBC WITH DIFFERENTIAL/PLATELET
Abs Immature Granulocytes: 0.13 10*3/uL — ABNORMAL HIGH (ref 0.00–0.07)
Basophils Absolute: 0 10*3/uL (ref 0.0–0.1)
Basophils Relative: 1 %
Eosinophils Absolute: 0.1 10*3/uL (ref 0.0–0.5)
Eosinophils Relative: 1 %
HCT: 36.5 % (ref 36.0–46.0)
Hemoglobin: 11.4 g/dL — ABNORMAL LOW (ref 12.0–15.0)
Immature Granulocytes: 2 %
Lymphocytes Relative: 15 %
Lymphs Abs: 1.3 10*3/uL (ref 0.7–4.0)
MCH: 25.6 pg — ABNORMAL LOW (ref 26.0–34.0)
MCHC: 31.2 g/dL (ref 30.0–36.0)
MCV: 82 fL (ref 80.0–100.0)
Monocytes Absolute: 0.5 10*3/uL (ref 0.1–1.0)
Monocytes Relative: 5 %
Neutro Abs: 6.7 10*3/uL (ref 1.7–7.7)
Neutrophils Relative %: 76 %
Platelets: 240 10*3/uL (ref 150–400)
RBC: 4.45 MIL/uL (ref 3.87–5.11)
RDW: 14.8 % (ref 11.5–15.5)
WBC: 8.7 10*3/uL (ref 4.0–10.5)
nRBC: 0 % (ref 0.0–0.2)

## 2021-11-22 LAB — GLUCOSE, CAPILLARY
Glucose-Capillary: 114 mg/dL — ABNORMAL HIGH (ref 70–99)
Glucose-Capillary: 120 mg/dL — ABNORMAL HIGH (ref 70–99)

## 2021-11-22 LAB — ECHOCARDIOGRAM COMPLETE
AR max vel: 2.36 cm2
AV Peak grad: 6.7 mmHg
Ao pk vel: 1.3 m/s
Area-P 1/2: 7.16 cm2
Height: 59 in
S' Lateral: 2.6 cm
Weight: 1615.99 oz

## 2021-11-22 LAB — HIV ANTIBODY (ROUTINE TESTING W REFLEX): HIV Screen 4th Generation wRfx: NONREACTIVE

## 2021-11-22 LAB — TSH: TSH: 0.504 u[IU]/mL (ref 0.350–4.500)

## 2021-11-22 LAB — MRSA NEXT GEN BY PCR, NASAL: MRSA by PCR Next Gen: NOT DETECTED

## 2021-11-22 LAB — CBG MONITORING, ED: Glucose-Capillary: 104 mg/dL — ABNORMAL HIGH (ref 70–99)

## 2021-11-22 NOTE — ED Notes (Signed)
Patient is complaining of left flank pain. Patient repositioned on right side with towel placed behind left flank for back support. Patient verbalized improved comfort will continue to monitor.

## 2021-11-22 NOTE — Consult Note (Signed)
[facility]                                                                                        Patient Identification: Patient Name: Leah Rose MRN: [id number] Admit Date: 11/20/2021 12:19 PM Today's Date: 11/22/2021 Reason for consult: MRSA bacteremia  Requesting provider: CHAMP auto consult   Principal Problem:   Sepsis secondary to UTI [facility])   Antibiotics:  Cephalexin 81/8 Ciprofloxacin 81/9-8/20 Vancomycin 8/20-  Lines/Hardware: PIV   Assessment 51 year old female with PMH of HTN, depression, chronic venous insufficiency who presented to the ED with persistent left flank pain, allergic reaction to cephalexin. Admitted for   Possible left sided pyelonephritis  8/18 Urine cx E coli  8/19 Urine E coli   MRSA bacteremia - she has a ruptured boil in her rt arm and likely the cause for MRSA bacteremia. She complains of left lower back pain, not midline back pain. No spinal tenderness, no signs of septic peripheral joints or no known hardwares TTE 8/21 unremarkable for vegetations or endocarditis   Recommendations  Continue Vancomycin for MRSA bacteremia Fu repeat blood cx on 8/19 for clearance as well as sensi of MRSA Continue Ciprofloxacin for UTI. Fu sensi of E coli from urine cx. Urine GC Since she does not have midline spine tenderness, I do not recommend MRI spine currently unless she develops new back pain w spinal tenderness on exam Will consider re-imaging of abdomen in case continues to have persistent left lower back pain r/o abscesses Monitor CBC, BMP and Vancomycin trough  Following   Rest of the management as per the primary team. Please call with questions or concerns.  Thank you for the consult  Odette Fraction, MD Infectious Disease Physician [facility] [address] [place], [place] [address] Phone:  [phone]  Fax: [phone]  __________________________________________________________________________________________________________ HPI and Hospital Course: 51 year old female with PMH of HTN, depression, chronic venous insufficiency who presented to the ED 8/18 with left-sided flank pain.  Denied any GU symptoms or fevers.  She was found to have UTI and was discharged on Keflex with analgesics and antiemetics.  She presented to the ED on 8/19 for generalized itching and hives to her upper extremities and trunk, swelling in her face after she had taken 2 doses of cephalexin.  Given 0.3 mg of epinephrine by EMS.  He was given 10 mg IM Decadron with 15 mg IM Toradol and 25 mg Benadryl capsule +20 mg famotidine  At ED, febrile with Tmax 102.5, no leukocytosis Imaging findings reviewed Blood cultures 8/19 mrsa Urine cx 8/18 and 8/19 E coli, many bacteria, positive leukocytes and nitrite  Patient seen in ED, complaining of acute left lower back pain for 1 day prior to initial ED visit. She has increased frequency of urination due to being on pills for HTN but no burning or dysuria. No prior h/o UTI or stones or urinary stent. Last sexually active was a while ago. Denies any subjective fevers but complains of chills. Denies midline back pain. Denis pain and swelling in peripheral joints. Denis known hardware. She had  a boil ruptured in her rt arm recently. She smokes, alcohol occasionally and denies  IVDU  ROS: all systems reviewed with pertinent positives and negatives as listed above  Past Medical History:  Diagnosis Date   Chronic venous insufficiency 06/23/2017   Essential hypertension 03/14/2006   Hypertensive emergency 07/15/2020   Hypokalemia 07/15/2020   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 No pertinent surgical history.   Scheduled Meds:  DULoxetine  30 mg Oral Daily   enoxaparin (LOVENOX) injection  30 mg  Subcutaneous Q24H   hydrochlorothiazide  12.5 mg Oral Daily   irbesartan  300 mg Oral Daily   lidocaine  1 patch Transdermal Q24H   rosuvastatin  20 mg Oral Daily   triamcinolone lotion   Topical BID   Continuous Infusions:  ciprofloxacin Stopped (11/21/21 2342)   vancomycin 750 mg (11/22/21 0741)   PRN Meds:.acetaminophen **OR** acetaminophen, ondansetron **OR** ondansetron (ZOFRAN) IV, oxyCODONE  Allergies  Allergen Reactions   Keflex [Cephalexin] Hives, Itching and Swelling   Penicillins Itching   Sulfonamide Derivatives Itching and Rash   Social History   Socioeconomic History   Marital status: Single    Spouse name: Not on file   Number of children: 1   Years of education: 12   Highest education level: Not on file  Occupational History   Occupation: Chief Operating Officer    Comment: Production manager [employer]  Tobacco Use   Smoking status: Every Day    Packs/day: 0.50    Types: Cigarettes   Smokeless tobacco: Never   Tobacco comments:    less sometimes.   Vaping Use   Vaping Use: Never used  Substance and Sexual Activity   Alcohol use: Yes    Alcohol/week: 14.0 standard drinks of alcohol    Types: 14 Cans of beer per week   Drug use: No   Sexual activity: Not on file  Other Topics Concern   Not on file  Social History Narrative   Lives with her "old man" and a cat in [place]   Social Determinants of Health   Financial Resource Strain: Not on file  Food Insecurity: Food Insecurity Present (08/25/2021)   Hunger Vital Sign    Worried About Running Out of Food in the Last Year: Sometimes true    Ran Out of Food in the Last Year: Sometimes true  Transportation Needs: Unmet Transportation Needs (08/25/2021)   PRAPARE - Administrator, Civil Service (Medical): No    Lack of Transportation (Non-Medical): Yes  Physical Activity: Not on file  Stress: Not on file  Social Connections: Not on file  Intimate Partner Violence:  Not on file   Family History  Problem Relation Age of Onset   Seizures Mother    Cirrhosis Mother    Birth defects Sister    Hypertension Sister    Breast cancer Maternal Grandmother    Thyroid disease Sister     Vitals BP (!) 181/118 (BP Location: Right Arm)   Pulse (!) 120   Temp (!) 102.5 F (39.2 C) (Oral) Comment: Removed several blankets off of pt and administered tylenol  Resp 16   Ht 4\' 11"  (1.499 m)   Wt 45.8 kg   SpO2 98%   BMI 20.40 kg/m   Physical Exam Constitutional:  lying in bed and in mild to moderate pain     Comments:   Cardiovascular:     Rate and Rhythm:  Normal rate and regular rhythm.     Heart sounds:   Pulmonary:     Effort: Pulmonary effort is normal on room air     Comments:  Normal breath sounds  Abdominal:     Palpations: Abdomen is soft.     Tenderness: non distended and non tender.  Musculoskeletal:        General: No swelling or tenderness in peripheral joints. No Spinal tenderness./ Overlying skin over LL back appears WNL with no tenderness or fluctuance  Skin:    Comments: RT arm has a ruptured boil with no redness/swelling/induration and fluctuance  Neurological:     General: grossly non focal, awake, alert and oriented   Psychiatric:        Mood and Affect: Mood normal.    Pertinent Microbiology Results for orders placed or performed during the hospital encounter of 11/20/21  Resp Panel by RT-PCR (Flu A&B, Covid) Anterior Nasal Swab     Status: None   Collection Time: 11/20/21  4:18 PM   Specimen: Anterior Nasal Swab  Result Value Ref Range Status   SARS Coronavirus 2 by RT PCR NEGATIVE NEGATIVE Final    Comment: (NOTE) SARS-CoV-2 target nucleic acids are NOT DETECTED.  The SARS-CoV-2 RNA is generally detectable in upper respiratory specimens during the acute phase of infection. The lowest concentration of SARS-CoV-2 viral copies this assay can detect is 138 copies/mL. A negative result does not preclude  SARS-Cov-2 infection and should not be used as the sole basis for treatment or other patient management decisions. A negative result may occur with  improper specimen collection/handling, submission of specimen other than nasopharyngeal swab, presence of viral mutation(s) within the areas targeted by this assay, and inadequate number of viral copies(<138 copies/mL). A negative result must be combined with clinical observations, patient history, and epidemiological information. The expected result is Negative.  Fact Sheet for Patients:  BloggerCourse.com  Fact Sheet for Healthcare Providers:  SeriousBroker.it  This test is no t yet approved or cleared by the [place] FDA and  has been authorized for detection and/or diagnosis of SARS-CoV-2 by FDA under an Emergency Use Authorization (EUA). This EUA will remain  in effect (meaning this test can be used) for the duration of the COVID-19 declaration under Section 564(b)(1) of the Act, 21 U.S.C.section 360bbb-3(b)(1), unless the authorization is terminated  or revoked sooner.       Influenza A by PCR NEGATIVE NEGATIVE Final   Influenza B by PCR NEGATIVE NEGATIVE Final    Comment: (NOTE) The Xpert Xpress SARS-CoV-2/FLU/RSV plus assay is intended as an aid in the diagnosis of influenza from Nasopharyngeal swab specimens and should not be used as a sole basis for treatment. Nasal washings and aspirates are unacceptable for Xpert Xpress SARS-CoV-2/FLU/RSV testing.  Fact Sheet for Patients: BloggerCourse.com  Fact Sheet for Healthcare Providers: SeriousBroker.it  This test is not yet approved or cleared by the [place] FDA and has been authorized for detection and/or diagnosis of SARS-CoV-2 by FDA under an Emergency Use Authorization (EUA). This EUA will remain in effect (meaning this test can be used) for the duration of  the COVID-19 declaration under Section 564(b)(1) of the Act, 21 U.S.C. section 360bbb-3(b)(1), unless the authorization is terminated or revoked.  Performed at [facility], [address] [place], [place] [address]   Blood Culture (routine x 2)     Status: None (Preliminary result)   Collection Time: 11/20/21  4:18 PM   Specimen: BLOOD RIGHT  FOREARM  Result Value Ref Range Status   Specimen Description BLOOD RIGHT FOREARM  Final   Special Requests   Final    BOTTLES DRAWN AEROBIC AND ANAEROBIC Blood Culture adequate volume   Culture  Setup Time   Final    GRAM POSITIVE COCCI IN BOTH AEROBIC AND ANAEROBIC BOTTLES CRITICAL RESULT CALLED TO, READ BACK BY AND VERIFIED WITH: PHARMD C PIERCE [id number] AT 752 AM BY CM Performed at [facility], [address], [place], [place] [address]    Culture GRAM POSITIVE COCCI  Final   Report Status PENDING  Incomplete  Urine Culture     Status: Abnormal (Preliminary result)   Collection Time: 11/20/21  4:18 PM   Specimen: In/Out Cath Urine  Result Value Ref Range Status   Specimen Description IN/OUT CATH URINE  Final   Special Requests   Final    NONE Performed at [facility], [address], [place], [place] [address]    Culture >=100,000 COLONIES/mL ESCHERICHIA COLI (A)  Final   Report Status PENDING  Incomplete  Blood Culture ID Panel (Reflexed)     Status: Abnormal   Collection Time: 11/20/21  4:18 PM  Result Value Ref Range Status   Enterococcus faecalis NOT DETECTED NOT DETECTED Final   Enterococcus Faecium NOT DETECTED NOT DETECTED Final   Listeria monocytogenes NOT DETECTED NOT DETECTED Final   Staphylococcus species DETECTED (A) NOT DETECTED Final    Comment: CRITICAL RESULT CALLED TO, READ BACK BY AND VERIFIED WITH: PHARMD C PIERCE [id number] AT 751 AM BY CM    Staphylococcus aureus (BCID) DETECTED (A) NOT DETECTED Final    Comment: Methicillin (oxacillin)-resistant Staphylococcus aureus (MRSA). MRSA is  predictably resistant to beta-lactam antibiotics (except ceftaroline). Preferred therapy is vancomycin unless clinically contraindicated. Patient requires contact precautions if  hospitalized. CRITICAL RESULT CALLED TO, READ BACK BY AND VERIFIED WITH: PHARMD C PIERCE [id number] AT 751 AM BY CM    Staphylococcus epidermidis NOT DETECTED NOT DETECTED Final   Staphylococcus lugdunensis NOT DETECTED NOT DETECTED Final   Streptococcus species NOT DETECTED NOT DETECTED Final   Streptococcus agalactiae NOT DETECTED NOT DETECTED Final   Streptococcus pneumoniae NOT DETECTED NOT DETECTED Final   Streptococcus pyogenes NOT DETECTED NOT DETECTED Final   A.calcoaceticus-baumannii NOT DETECTED NOT DETECTED Final   Bacteroides fragilis NOT DETECTED NOT DETECTED Final   Enterobacterales NOT DETECTED NOT DETECTED Final   Enterobacter cloacae complex NOT DETECTED NOT DETECTED Final   Escherichia coli NOT DETECTED NOT DETECTED Final   Klebsiella aerogenes NOT DETECTED NOT DETECTED Final   Klebsiella oxytoca NOT DETECTED NOT DETECTED Final   Klebsiella pneumoniae NOT DETECTED NOT DETECTED Final   Proteus species NOT DETECTED NOT DETECTED Final   Salmonella species NOT DETECTED NOT DETECTED Final   Serratia marcescens NOT DETECTED NOT DETECTED Final   Haemophilus influenzae NOT DETECTED NOT DETECTED Final   Neisseria meningitidis NOT DETECTED NOT DETECTED Final   Pseudomonas aeruginosa NOT DETECTED NOT DETECTED Final   Stenotrophomonas maltophilia NOT DETECTED NOT DETECTED Final   Candida albicans NOT DETECTED NOT DETECTED Final   Candida auris NOT DETECTED NOT DETECTED Final   Candida glabrata NOT DETECTED NOT DETECTED Final   Candida krusei NOT DETECTED NOT DETECTED Final   Candida parapsilosis NOT DETECTED NOT DETECTED Final   Candida tropicalis NOT DETECTED NOT DETECTED Final   Cryptococcus neoformans/gattii NOT DETECTED NOT DETECTED Final   Meth resistant mecA/C and MREJ DETECTED (A) NOT DETECTED Final  Comment: CRITICAL RESULT CALLED TO, READ BACK BY AND VERIFIED WITH: PHARMD C PIERCE [phone] AT 751 AM BY CM Performed at [facility], [address] [place], [place] [address]    Pertinent Lab seen by me:    Latest Ref Rng & Units 11/21/2021    6:52 AM 11/20/2021    3:10 PM 11/19/2021    9:00 AM  CBC  WBC 4.0 - 10.5 K/uL 8.7  9.4  13.9   Hemoglobin 12.0 - 15.0 g/dL 29.511.4  62.113.8  30.812.7   Hematocrit 36.0 - 46.0 % 36.5  42.7  39.7   Platelets 150 - 400 K/uL 240  280  273       Latest Ref Rng & Units 11/21/2021    6:52 AM 11/20/2021    5:05 PM 11/19/2021    9:00 AM  CMP  Glucose 70 - 99 mg/dL 96  657161  846120   BUN 6 - 20 mg/dL 5  11  10    Creatinine 0.44 - 1.00 mg/dL 9.620.64  9.520.96  8.410.57   Sodium 135 - 145 mmol/L 133  136  140   Potassium 3.5 - 5.1 mmol/L 3.6  4.0  3.3   Chloride 98 - 111 mmol/L 102  110  111   CO2 22 - 32 mmol/L 21  19  23    Calcium 8.9 - 10.3 mg/dL 8.4  8.0  8.9   Total Protein 6.5 - 8.1 g/dL  5.4  7.7   Total Bilirubin 0.3 - 1.2 mg/dL  0.6  0.6   Alkaline Phos 38 - 126 U/L  45  69   AST 15 - 41 U/L  25  16   ALT 0 - 44 U/L  17  12     Pertinent Imagings/Other Imagings Plain films and CT images have been personally visualized and interpreted; radiology reports have been reviewed. Decision making incorporated into the Impression / Recommendations.  DG Chest Port 1 View  Result Date: 11/20/2021 CLINICAL DATA:  Questionable sepsis - evaluate for abnormality EXAM: PORTABLE CHEST 1 VIEW COMPARISON:  Chest radiograph 08/10/2016 FINDINGS: Mild cardiomegaly. Stable mediastinal contours. No pulmonary edema, focal airspace disease, pleural effusion, or pneumothorax. No acute osseous findings. IMPRESSION: Mild cardiomegaly. No acute chest findings. Electronically Signed   By: Narda RutherfordMelanie  Sanford M.D.   On: 11/20/2021 16:39   CT ABDOMEN PELVIS WO CONTRAST  Result Date: 11/19/2021 CLINICAL DATA:  Flank pain.  Evaluate for kidney stone. EXAM: CT ABDOMEN AND PELVIS WITHOUT  CONTRAST TECHNIQUE: Multidetector CT imaging of the abdomen and pelvis was performed following the standard protocol without IV contrast. RADIATION DOSE REDUCTION: This exam was performed according to the departmental dose-optimization program which includes automated exposure control, adjustment of the mA and/or kV according to patient size and/or use of iterative reconstruction technique. COMPARISON:  None Available. FINDINGS: Lower chest: No acute abnormality. Hepatobiliary: No focal liver abnormality is seen. No gallstones, gallbladder wall thickening, or biliary dilatation. Pancreas: Unremarkable. No pancreatic ductal dilatation or surrounding inflammatory changes. Spleen: Normal in size without focal abnormality. Adrenals/Urinary Tract: Normal adrenal glands. No nephrolithiasis, hydronephrosis or mass identified bilaterally. No hydroureter or ureteral lithiasis. Urinary bladder is unremarkable. Stomach/Bowel: Small hiatal hernia. Stomach otherwise unremarkable. The appendix is visualized and appears normal. No bowel wall thickening, inflammation, or distension. Vascular/Lymphatic: No significant vascular findings are present. No enlarged abdominal or pelvic lymph nodes. Reproductive: Uterus and bilateral adnexa are unremarkable. Other: No free fluid or fluid collections identified. No signs of pneumoperitoneum. No  ventral abdominal wall hernia identified. Musculoskeletal: No acute or suspicious osseous findings. Bilateral L5-S1 facet arthropathy. IMPRESSION: 1. No acute findings within the abdomen or pelvis. No signs of nephrolithiasis or hydronephrosis. 2. Small hiatal hernia. Electronically Signed   By: Signa Kell M.D.   On: 11/19/2021 09:53     I spent 100  minutes for this patient encounter including review of prior medical records/discussing diagnostics and treatment plan with the patient/family/coordinate care with primary/other specialits with greater than 50% of time in face to face encounter.    Electronically signed by:   Odette Fraction, MD Infectious Disease Physician [facility] Pager: [phone]

## 2021-11-22 NOTE — Progress Notes (Signed)
NAME:  Leah Rose, MRN:  [id number], DOB:  April 18, 1970, LOS: 1 ADMISSION DATE:  11/20/2021  Subjective  Patient evaluated at bedside this AM. She is feeling hot and has persistent back pain. Otherwise nothing is bothering her. She denies CP,SOB, injection drug use, any wounds, or dental complaints although hasnt seen a dentist in years.  Objective   Blood pressure (!) 164/107, pulse 100, temperature 99.2 F (37.3 C), resp. rate (!) 24, height 4\' 11"  (1.499 m), weight 45.8 kg, SpO2 94 %.     Intake/Output Summary (Last 24 hours) at 11/22/2021 1054 Last data filed at 11/22/2021 11/24/2021 Gross per 24 hour  Intake 148.91 ml  Output 600 ml  Net -451.09 ml    Filed Weights   11/21/21 0901  Weight: 45.8 kg   Physical Exam: General: Resting in bed in the emergency department, mild distress, ill appearing HEENT: Normocephalic, atraumatic, poor dentition, no discrete abscess or site of infection, question leukoplakia on the right lower gumline CV: Regular rate, rhythm, no m/r/g Pulm: Normal work of breathing on room air. Clear to ausculation bilaterally Abdomen: Soft, non-tender, non-distended. Normoactive bowel sounds. No CVA tenderness Skin: Rough, scaling patch on L neck, no open wounds appreciated. No other rashes or wounds. Skin is diffusely hot to the touch. MSK: No fluctuance, mass or rash at the left flank. No pain to palpation of the left flank. No bony pain over the spine. Neuro: Awake, alert, oriented  Labs       Latest Ref Rng & Units 11/21/2021    6:52 AM 11/20/2021    3:10 PM 11/19/2021    9:00 AM  CBC  WBC 4.0 - 10.5 K/uL 8.7  9.4  13.9   Hemoglobin 12.0 - 15.0 g/dL 11/21/2021  76.5  46.5   Hematocrit 36.0 - 46.0 % 36.5  42.7  39.7   Platelets 150 - 400 K/uL 240  280  273       Latest Ref Rng & Units 11/21/2021    6:52 AM 11/20/2021    5:05 PM 11/19/2021    9:00 AM  BMP  Glucose 70 - 99 mg/dL 96  11/21/2021  465   BUN 6 - 20 mg/dL 5  11  10    Creatinine 0.44 - 1.00 mg/dL 681    2.75   Sodium 135 - 145 mmol/L 133  136  140   Potassium 3.5 - 5.1 mmol/L 3.6  4.0  3.3   Chloride 98 - 111 mmol/L 102  110  111   CO2 22 - 32 mmol/L 21  19  23    Calcium 8.9 - 10.3 mg/dL 8.4  8.0  8.9    Summary   Leah Rose is 51yo with tobacco use disorder, chronic venous insufficiency, hypertension, TIA admitted 8/19 for cephalosporin allergic reaction and found to have MRSA bacteremia.  Assessment & Plan:  Principal Problem:   Sepsis secondary to UTI (HCC)  MRSA bacteremia E. coli urinary tract infection Patient presented due to cephalosporin reaction in the setting of UTI treatment for E. Coli. Only one blood culture was obtained and it was positive for MRSA.There was no sign of infection on cxr or CT A/P. Skin exam was unremarkable for wounds or signs of infection other than a rupture boil on her rt arm, which is the likely source of infection per ID. Oral exam was positive for poor and missing dentition but no gross abscess or apparent infection source. The patient has persistent left flank pain that has  no appreciable underlying fluctuance, masses or skin changes. There is no ptp or cva tenderness. Do not suspect urinary source of infection especially given discrepancy between E coli and MRSA.It seems as if the left flank pain may represent a possible msk source of infection such as deep abscess, although I would have expected this to be visualized on the CT. Will continue following repeat blood cultures and will work with ID. Could consider repeat CT of the abdomen with contrast to further evaluate the abdomen for abscess. The patient denies IV drug use, had no murmurs on exam and had a negative TTE 8/21 for endocarditis/vegetations, but will likely need TEE. Patient fevered this am to 102. Blood pressure has remained hypertensive with other vitals wnl and stable. - PRN tylenol for fever and pain - Follow-up repeat blood cultures - Avoid cephalosporins, penicillins,  sulfonamides d/t allergies -Consider TEE  -ID following  Atopic dermatitis Pruritic patch on left side of neck most consistent with atopic dermatitis. She does wear a necklace regularly (her father's ashes), but this is not a new piece of jewelry. Contact dermatitis less likely. No vesicles, satellite lesions, bullae appreciated. Will provide topical corticosteroid cream for symptomatic relief. - Triamcinolone cream  L flank pain Patient says this was an acute onset flank pain without trauma. Denies hematuria or dysuria and has no cva tenderness. CT A/P with no intraabdominal infection. Lidocaine patch has provided no relief. Do not believe this is pyelonephritis or a superficial traumatic msk etiology. Am concerned for a deeper MSK source of MRSA bacteremia or secondary seeding such as abscess.   Best practice:  DIET: CM IVF: na DVT PPX: lovenox BOWEL: na CODE: FULL FAM COM: na  Evlyn Kanner, MD Internal Medicine Resident PGY-3 PAGER: [phone] 11/22/2021 10:54 AM  If after hours (below), please contact on-call pager: [phone] 5PM-7AM Monday-Friday 1PM-7AM Saturday-Sunday

## 2021-11-22 NOTE — ED Notes (Signed)
ED TO INPATIENT HANDOFF REPORT  ED Nurse Name and Phone #: Joice Loftsamber [phone]  S Name/Age/Gender Leah NapoleonKisha Rose 51 y.o. female Room/Bed: 005C/005C  Code Status   Code Status: Full Code  Home/SNF/Other Home Patient oriented to: self, place, time, and situation Is this baseline? Yes   Triage Complete: Triage complete  Chief Complaint Sepsis secondary to UTI (HCC) [A41.9, N39.0]  Triage Note [facility] complaint of possible allergic reaction after being diagnosed with UTI yesterday and starting keflex, zofran and norco. Awoke during night with itching and hives and again this am. Patient received EPI prior to arrival. EMS reports BP 80s. Patient with no hives noted on arrival, alert and oriented   Allergies Allergies  Allergen Reactions   Keflex [Cephalexin] Hives, Itching and Swelling   Penicillins Itching   Sulfonamide Derivatives Itching and Rash    Level of Care/Admitting Diagnosis ED Disposition     ED Disposition  Admit   Condition  --   Comment  [facility] [[id number]]  Level of Care: Med-Surg [16]  [facility] or [facility] if equivalent level of care is available:: No  Covid Evaluation: Asymptomatic - no recent exposure (last 10 days) testing not required  Diagnosis: Sepsis secondary to UTI [facility](HCC) [[id number]]) [[id number]]  Admitting Physician: Silvio PateHOFFMAN, ERIK C [[id number]]  Attending Physician: Gust RungHOFFMAN, ERIK C [[id number]]  Certification:: I certify this patient will need inpatient services for at least 2 midnights  Estimated Length of Stay: 2          B Medical/Surgery History Past Medical History:  Diagnosis Date   Chronic venous insufficiency 06/23/2017   Essential hypertension 03/14/2006   Hypertensive emergency 07/15/2020   Hypokalemia 07/15/2020   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 No pertinent surgical history.   A IV  Location/Drains/Wounds Patient Lines/Drains/Airways Status     Active Line/Drains/Airways     Name Placement date Placement time Site Days   Peripheral IV 11/20/21 20 G Anterior;Distal;Right;Upper Arm 11/20/21  --  Arm  2   Peripheral IV 11/21/21 22 G 1.75" Left;Posterior Forearm 11/21/21  0330  Forearm  1            Intake/Output Last 24 hours  Intake/Output Summary (Last 24 hours) at 11/22/2021 1106 Last data filed at 11/22/2021 0912 Gross per 24 hour  Intake 148.91 ml  Output 600 ml  Net -451.09 ml    Labs/Imaging Results for orders placed or performed during the hospital encounter of 11/20/21 (from the past 48 hour(s))  CBC with Differential     Status: Abnormal   Collection Time: 11/20/21  3:10 PM  Result Value Ref Range   WBC 9.4 4.0 - 10.5 K/uL   RBC 5.24 (H) 3.87 - 5.11 MIL/uL   Hemoglobin 13.8 12.0 - 15.0 g/dL   HCT 56.242.7 13.036.0 - 86.546.0 %   MCV 81.5 80.0 - 100.0 fL   MCH 26.3 26.0 - 34.0 pg   MCHC 32.3 30.0 - 36.0 g/dL   RDW 78.415.1 69.611.5 - 29.515.5 %   Platelets 280 150 - 400 K/uL   nRBC 0.0 0.0 - 0.2 %   Neutrophils Relative % 80 %   Neutro Abs 7.5 1.7 - 7.7 K/uL   Lymphocytes Relative 16 %   Lymphs Abs 1.5 0.7 - 4.0 K/uL   Monocytes Relative 2 %   Monocytes Absolute 0.2 0.1 - 1.0 K/uL   Eosinophils Relative 0 %  Eosinophils Absolute 0.0 0.0 - 0.5 K/uL   Basophils Relative 2 %   Basophils Absolute 0.2 (H) 0.0 - 0.1 K/uL   WBC Morphology See Note     Comment: Mild Left Shift. 1 to 5% Metas and Myelos, Occ Pro Noted.  Increased Bands. >20% Bands    nRBC 1 (H) 0 /100 WBC   Abs Immature Granulocytes 0.00 0.00 - 0.07 K/uL    Comment: Performed at [facility], [address] [place], [place] [address]  Resp Panel by RT-PCR (Flu A&B, Covid) Anterior Nasal Swab     Status: None   Collection Time: 11/20/21  4:18 PM   Specimen: Anterior Nasal Swab  Result Value Ref Range   SARS Coronavirus 2 by RT PCR NEGATIVE NEGATIVE    Comment: (NOTE) SARS-CoV-2 target  nucleic acids are NOT DETECTED.  The SARS-CoV-2 RNA is generally detectable in upper respiratory specimens during the acute phase of infection. The lowest concentration of SARS-CoV-2 viral copies this assay can detect is 138 copies/mL. A negative result does not preclude SARS-Cov-2 infection and should not be used as the sole basis for treatment or other patient management decisions. A negative result may occur with  improper specimen collection/handling, submission of specimen other than nasopharyngeal swab, presence of viral mutation(s) within the areas targeted by this assay, and inadequate number of viral copies(<138 copies/mL). A negative result must be combined with clinical observations, patient history, and epidemiological information. The expected result is Negative.  Fact Sheet for Patients:  BloggerCourse.com  Fact Sheet for Healthcare Providers:  SeriousBroker.it  This test is no t yet approved or cleared by the [place] FDA and  has been authorized for detection and/or diagnosis of SARS-CoV-2 by FDA under an Emergency Use Authorization (EUA). This EUA will remain  in effect (meaning this test can be used) for the duration of the COVID-19 declaration under Section 564(b)(1) of the Act, 21 U.S.C.section 360bbb-3(b)(1), unless the authorization is terminated  or revoked sooner.       Influenza A by PCR NEGATIVE NEGATIVE   Influenza B by PCR NEGATIVE NEGATIVE    Comment: (NOTE) The Xpert Xpress SARS-CoV-2/FLU/RSV plus assay is intended as an aid in the diagnosis of influenza from Nasopharyngeal swab specimens and should not be used as a sole basis for treatment. Nasal washings and aspirates are unacceptable for Xpert Xpress SARS-CoV-2/FLU/RSV testing.  Fact Sheet for Patients: BloggerCourse.com  Fact Sheet for Healthcare Providers: SeriousBroker.it  This test  is not yet approved or cleared by the [place] FDA and has been authorized for detection and/or diagnosis of SARS-CoV-2 by FDA under an Emergency Use Authorization (EUA). This EUA will remain in effect (meaning this test can be used) for the duration of the COVID-19 declaration under Section 564(b)(1) of the Act, 21 U.S.C. section 360bbb-3(b)(1), unless the authorization is terminated or revoked.  Performed at [facility], [address] [place], [place] [address]   Protime-INR     Status: Abnormal   Collection Time: 11/20/21  4:18 PM  Result Value Ref Range   Prothrombin Time 16.0 (H) 11.4 - 15.2 seconds   INR 1.3 (H) 0.8 - 1.2    Comment: (NOTE) INR goal varies based on device and disease states. Performed at [facility], [address] [place], [place] [address]   APTT     Status: None   Collection Time: 11/20/21  4:18 PM  Result Value Ref Range   aPTT 28 24 -  36 seconds    Comment: Performed at [facility], [address] [place], [place] [address]  Blood Culture (routine x 2)     Status: Abnormal (Preliminary result)   Collection Time: 11/20/21  4:18 PM   Specimen: BLOOD RIGHT FOREARM  Result Value Ref Range   Specimen Description BLOOD RIGHT FOREARM    Special Requests      BOTTLES DRAWN AEROBIC AND ANAEROBIC Blood Culture adequate volume   Culture  Setup Time      GRAM POSITIVE COCCI IN BOTH AEROBIC AND ANAEROBIC BOTTLES CRITICAL RESULT CALLED TO, READ BACK BY AND VERIFIED WITH: PHARMD C PIERCE [id number] AT 752 AM BY CM    Culture (A)     STAPHYLOCOCCUS AUREUS SUSCEPTIBILITIES TO FOLLOW Performed at [facility], [address], [place], [place] [address]    Report Status PENDING   Urinalysis, Routine w reflex microscopic Urine, Clean Catch     Status: Abnormal   Collection Time: 11/20/21  4:18 PM  Result Value Ref Range   Color, Urine YELLOW YELLOW   APPearance CLEAR CLEAR   Specific Gravity, Urine 1.015 1.005 - 1.030   pH 6.0  5.0 - 8.0   Glucose, UA NEGATIVE NEGATIVE mg/dL   Hgb urine dipstick TRACE (A) NEGATIVE   Bilirubin Urine NEGATIVE NEGATIVE   Ketones, ur NEGATIVE NEGATIVE mg/dL   Protein, ur NEGATIVE NEGATIVE mg/dL   Nitrite POSITIVE (A) NEGATIVE   Leukocytes,Ua SMALL (A) NEGATIVE    Comment: Performed at [facility], [address] [place], [place] [address]  Urine Culture     Status: Abnormal (Preliminary result)   Collection Time: 11/20/21  4:18 PM   Specimen: In/Out Cath Urine  Result Value Ref Range   Specimen Description IN/OUT CATH URINE    Special Requests NONE    Culture (A)     >=100,000 COLONIES/mL ESCHERICHIA COLI CONFIRMATION OF SUSCEPTIBILITIES IN PROGRESS Performed at [facility], [address], [place], [place] [address]    Report Status PENDING   Urinalysis, Microscopic (reflex)     Status: Abnormal   Collection Time: 11/20/21  4:18 PM  Result Value Ref Range   RBC / HPF NONE SEEN 0 - 5 RBC/hpf   WBC, UA 0-5 0 - 5 WBC/hpf   Bacteria, UA MANY (A) NONE SEEN   Squamous Epithelial / LPF 0-5 0 - 5    Comment: Performed at [facility], [address], [place], [place] [address]  Blood Culture ID Panel (Reflexed)     Status: Abnormal   Collection Time: 11/20/21  4:18 PM  Result Value Ref Range   Enterococcus faecalis NOT DETECTED NOT DETECTED   Enterococcus Faecium NOT DETECTED NOT DETECTED   Listeria monocytogenes NOT DETECTED NOT DETECTED   Staphylococcus species DETECTED (A) NOT DETECTED    Comment: CRITICAL RESULT CALLED TO, READ BACK BY AND VERIFIED WITH: PHARMD C PIERCE [id number] AT 751 AM BY CM    Staphylococcus aureus (BCID) DETECTED (A) NOT DETECTED    Comment: Methicillin (oxacillin)-resistant Staphylococcus aureus (MRSA). MRSA is predictably resistant to beta-lactam antibiotics (except ceftaroline). Preferred therapy is vancomycin unless clinically contraindicated. Patient requires contact precautions if  hospitalized. CRITICAL RESULT CALLED TO,  READ BACK BY AND VERIFIED WITH: PHARMD C PIERCE [id number] AT 751 AM BY CM    Staphylococcus epidermidis NOT DETECTED NOT DETECTED   Staphylococcus lugdunensis NOT DETECTED NOT DETECTED   Streptococcus species NOT DETECTED NOT DETECTED   Streptococcus  agalactiae NOT DETECTED NOT DETECTED   Streptococcus pneumoniae NOT DETECTED NOT DETECTED   Streptococcus pyogenes NOT DETECTED NOT DETECTED   A.calcoaceticus-baumannii NOT DETECTED NOT DETECTED   Bacteroides fragilis NOT DETECTED NOT DETECTED   Enterobacterales NOT DETECTED NOT DETECTED   Enterobacter cloacae complex NOT DETECTED NOT DETECTED   Escherichia coli NOT DETECTED NOT DETECTED   Klebsiella aerogenes NOT DETECTED NOT DETECTED   Klebsiella oxytoca NOT DETECTED NOT DETECTED   Klebsiella pneumoniae NOT DETECTED NOT DETECTED   Proteus species NOT DETECTED NOT DETECTED   Salmonella species NOT DETECTED NOT DETECTED   Serratia marcescens NOT DETECTED NOT DETECTED   Haemophilus influenzae NOT DETECTED NOT DETECTED   Neisseria meningitidis NOT DETECTED NOT DETECTED   Pseudomonas aeruginosa NOT DETECTED NOT DETECTED   Stenotrophomonas maltophilia NOT DETECTED NOT DETECTED   Candida albicans NOT DETECTED NOT DETECTED   Candida auris NOT DETECTED NOT DETECTED   Candida glabrata NOT DETECTED NOT DETECTED   Candida krusei NOT DETECTED NOT DETECTED   Candida parapsilosis NOT DETECTED NOT DETECTED   Candida tropicalis NOT DETECTED NOT DETECTED   Cryptococcus neoformans/gattii NOT DETECTED NOT DETECTED   Meth resistant mecA/C and MREJ DETECTED (A) NOT DETECTED    Comment: CRITICAL RESULT CALLED TO, READ BACK BY AND VERIFIED WITH: PHARMD C PIERCE [id number] AT 751 AM BY CM Performed at [facility], [address] [place], [place] [address]   I-Stat beta hCG blood, ED     Status: None   Collection Time: 11/20/21  4:56 PM  Result Value Ref Range   I-stat hCG, quantitative <5.0 <5 mIU/mL   Comment 3            Comment:   GEST. AGE       CONC.  (mIU/mL)   <=1 WEEK        5 - 50     2 WEEKS       50 - 500     3 WEEKS       100 - 10,000     4 WEEKS     1,000 - 30,000        FEMALE AND NON-PREGNANT FEMALE:     LESS THAN 5 mIU/mL   Comprehensive metabolic panel     Status: Abnormal   Collection Time: 11/20/21  5:05 PM  Result Value Ref Range   Sodium 136 135 - 145 mmol/L   Potassium 4.0 3.5 - 5.1 mmol/L    Comment: DELTA CHECK NOTED   Chloride 110 98 - 111 mmol/L   CO2 19 (L) 22 - 32 mmol/L   Glucose, Bld 161 (H) 70 - 99 mg/dL    Comment: Glucose reference range applies only to samples taken after fasting for at least 8 hours.   BUN 11 6 - 20 mg/dL   Creatinine, Ser 9.81 0.44 - 1.00 mg/dL   Calcium 8.0 (L) 8.9 - 10.3 mg/dL   Total Protein 5.4 (L) 6.5 - 8.1 g/dL   Albumin 2.4 (L) 3.5 - 5.0 g/dL   AST 25 15 - 41 U/L   ALT 17 0 - 44 U/L   Alkaline Phosphatase 45 38 - 126 U/L   Total Bilirubin 0.6 0.3 - 1.2 mg/dL   GFR, Estimated >19 >14 mL/min    Comment: (NOTE) Calculated using the CKD-EPI Creatinine Equation (2021)    Anion gap 7 5 - 15    Comment: Performed at [facility], [address], [place], [place] [address]  Lactic acid, plasma     Status: Abnormal   Collection Time: 11/20/21  5:44 PM  Result Value Ref Range   Lactic Acid, Venous 2.6 (HH) 0.5 - 1.9 mmol/L    Comment: CRITICAL RESULT CALLED TO, READ BACK BY AND VERIFIED WITH A,BRITT RN @1905  11/20/21 E,BENTON Performed at [facility], [address], [place], [place]   Lactic acid, plasma     Status: None   Collection Time: 11/20/21  9:09 PM  Result Value Ref Range   Lactic Acid, Venous 1.7 0.5 - 1.9 mmol/L    Comment: Performed at [facility], [address] [place], [place]  CBC with Differential/Platelet     Status: Abnormal   Collection Time: 11/21/21  6:52 AM  Result Value Ref Range   WBC 8.7 4.0 - 10.5 K/uL   RBC 4.45 3.87 - 5.11 MIL/uL   Hemoglobin 11.4 (L) 12.0 - 15.0 g/dL   HCT 11/23/21 83.2 - 54.9 %    MCV 82.0 80.0 - 100.0 fL   MCH 25.6 (L) 26.0 - 34.0 pg   MCHC 31.2 30.0 - 36.0 g/dL   RDW 82.6 41.5 - 83.0 %   Platelets 240 150 - 400 K/uL   nRBC 0.0 0.0 - 0.2 %   Neutrophils Relative % 76 %   Neutro Abs 6.7 1.7 - 7.7 K/uL   Lymphocytes Relative 15 %   Lymphs Abs 1.3 0.7 - 4.0 K/uL   Monocytes Relative 5 %   Monocytes Absolute 0.5 0.1 - 1.0 K/uL   Eosinophils Relative 1 %   Eosinophils Absolute 0.1 0.0 - 0.5 K/uL   Basophils Relative 1 %   Basophils Absolute 0.0 0.0 - 0.1 K/uL   Immature Granulocytes 2 %   Abs Immature Granulocytes 0.13 (H) 0.00 - 0.07 K/uL    Comment: Performed at [facility], [address], [place], [place]  Basic metabolic panel     Status: Abnormal   Collection Time: 11/21/21  6:52 AM  Result Value Ref Range   Sodium 133 (L) 135 - 145 mmol/L   Potassium 3.6 3.5 - 5.1 mmol/L   Chloride 102 98 - 111 mmol/L   CO2 21 (L) 22 - 32 mmol/L   Glucose, Bld 96 70 - 99 mg/dL    Comment: Glucose reference range applies only to samples taken after fasting for at least 8 hours.   BUN 5 (L) 6 - 20 mg/dL   Creatinine, Ser 11/23/21 0.44 - 1.00 mg/dL   Calcium 8.4 (L) 8.9 - 10.3 mg/dL   GFR, Estimated 8.11 >03 mL/min    Comment: (NOTE) Calculated using the CKD-EPI Creatinine Equation (2021)    Anion gap 10 5 - 15    Comment: Performed at [facility], [address], [place], [place]  CBG monitoring, ED     Status: Abnormal   Collection Time: 11/21/21  9:21 AM  Result Value Ref Range   Glucose-Capillary 102 (H) 70 - 99 mg/dL    Comment: Glucose reference range applies only to samples taken after fasting for at least 8 hours.  CBG monitoring, ED     Status: Abnormal   Collection Time: 11/21/21 12:23 PM  Result Value Ref Range   Glucose-Capillary 111 (H) 70 - 99 mg/dL    Comment: Glucose reference range applies only to samples taken after fasting for at least 8 hours.  CBG monitoring, ED     Status: Abnormal  Collection Time: 11/21/21   6:54 PM  Result Value Ref Range   Glucose-Capillary 119 (H) 70 - 99 mg/dL    Comment: Glucose reference range applies only to samples taken after fasting for at least 8 hours.  CBG monitoring, ED     Status: Abnormal   Collection Time: 11/22/21  6:55 AM  Result Value Ref Range   Glucose-Capillary 104 (H) 70 - 99 mg/dL    Comment: Glucose reference range applies only to samples taken after fasting for at least 8 hours.   DG Chest Port 1 View  Result Date: 11/20/2021 CLINICAL DATA:  Questionable sepsis - evaluate for abnormality EXAM: PORTABLE CHEST 1 VIEW COMPARISON:  Chest radiograph 08/10/2016 FINDINGS: Mild cardiomegaly. Stable mediastinal contours. No pulmonary edema, focal airspace disease, pleural effusion, or pneumothorax. No acute osseous findings. IMPRESSION: Mild cardiomegaly. No acute chest findings. Electronically Signed   By: Narda Rutherford M.D.   On: 11/20/2021 16:39    Pending Labs Unresulted Labs (From admission, onward)     Start     Ordered   11/21/21 0816  Culture, blood (Routine X 2) w Reflex to ID Panel  BLOOD CULTURE X 2,   R      11/21/21 0815   11/21/21 0705  MRSA Next Gen by PCR, Nasal  Once,   R        11/21/21 0704   11/21/21 0500  HIV Antibody (routine testing w rflx)  (HIV Antibody (Routine testing w reflex) panel)  Tomorrow morning,   R        11/20/21 2123   11/21/21 0500  TSH  Tomorrow morning,   R        11/20/21 2123   11/20/21 1618  Blood Culture (routine x 2)  (Septic presentation on arrival (screening labs, nursing and treatment orders for obvious sepsis))  BLOOD CULTURE X 2,   STAT      11/20/21 1619   11/19/21 1116  Susceptibility, Aer + Anaerob  Once,   R        11/19/21 1116            Vitals/Pain Today's Vitals   11/22/21 1000 11/22/21 1024 11/22/21 1024 11/22/21 1045  BP: (!) 164/107   (!) 149/91  Pulse: 100   (!) 101  Resp: (!) 24   20  Temp:   99.2 F (37.3 C)   TempSrc:      SpO2: 94%   94%  Weight:      Height:       PainSc:  10-Worst pain ever      Isolation Precautions No active isolations  Medications Medications  lactated ringers infusion (0 mLs Intravenous Stopped 11/21/21 1042)  enoxaparin (LOVENOX) injection 30 mg (30 mg Subcutaneous Given 11/22/21 0731)  acetaminophen (TYLENOL) tablet 650 mg (650 mg Oral Given 11/22/21 0733)    Or  acetaminophen (TYLENOL) suppository 650 mg ( Rectal See Alternative 11/22/21 0733)  ondansetron (ZOFRAN) tablet 4 mg (has no administration in time range)    Or  ondansetron (ZOFRAN) injection 4 mg (has no administration in time range)  oxyCODONE (Oxy IR/ROXICODONE) immediate release tablet 5 mg (5 mg Oral Given 11/22/21 0732)  DULoxetine (CYMBALTA) DR capsule 30 mg (30 mg Oral Given 11/22/21 1016)  hydrochlorothiazide (HYDRODIURIL) tablet 12.5 mg (12.5 mg Oral Given 11/22/21 1016)  irbesartan (AVAPRO) tablet 300 mg (300 mg Oral Given 11/22/21 1016)  rosuvastatin (CRESTOR) tablet 20 mg (20 mg Oral Given 11/22/21 1016)  ciprofloxacin (CIPRO) IVPB 400  mg (400 mg Intravenous New Bag/Given 11/22/21 1023)  vancomycin (VANCOREADY) IVPB 750 mg/150 mL (0 mg Intravenous Stopped 11/22/21 0840)  lidocaine (LIDODERM) 5 % 1 patch (1 patch Transdermal Patch Applied 11/22/21 0731)  triamcinolone lotion (KENALOG) 0.1 % ( Topical Not Given 11/22/21 0914)  sodium chloride 0.9 % bolus 1,000 mL (0 mLs Intravenous Stopped 11/20/21 1802)  dexamethasone (DECADRON) injection 10 mg (10 mg Intramuscular Given 11/20/21 1431)  diphenhydrAMINE (BENADRYL) capsule 25 mg (25 mg Oral Given 11/20/21 1430)  famotidine (PEPCID) tablet 20 mg (20 mg Oral Given 11/20/21 1430)  ketorolac (TORADOL) 15 MG/ML injection 15 mg (15 mg Intramuscular Given 11/20/21 1431)  lactated ringers bolus 500 mL (0 mLs Intravenous Stopped 11/20/21 1917)  ciprofloxacin (CIPRO) IVPB 400 mg (0 mg Intravenous Stopped 11/20/21 1803)  morphine (PF) 4 MG/ML injection 4 mg (4 mg Intravenous Given 11/20/21 1958)  sodium chloride 0.9 % bolus 1,000  mL (0 mLs Intravenous Stopped 11/20/21 2336)  vancomycin (VANCOCIN) IVPB 1000 mg/200 mL premix (0 mg Intravenous Stopped 11/21/21 1213)    Mobility walks Low fall risk   Focused Assessments Left flank pain denies any other urinary s/s at this time   R Recommendations: See Admitting Provider Note  Report given to:   Additional Notes:

## 2021-11-22 NOTE — ED Notes (Signed)
Phlebotomy requested for pending labs Informed pt is a difficult stick.

## 2021-11-22 NOTE — ED Notes (Signed)
Pt BP trending high last two readings. BP readjusted BP reading high. BP 169/106. Informed IM. Pt also endorses headache. Repeat T 100.9 and back pain. PRN tylenol and oxy given.

## 2021-11-23 DIAGNOSIS — N009 Acute nephritic syndrome with unspecified morphologic changes: Secondary | ICD-10-CM

## 2021-11-23 DIAGNOSIS — N390 Urinary tract infection, site not specified: Secondary | ICD-10-CM | POA: Diagnosis not present

## 2021-11-23 DIAGNOSIS — R7881 Bacteremia: Secondary | ICD-10-CM | POA: Diagnosis not present

## 2021-11-23 DIAGNOSIS — A419 Sepsis, unspecified organism: Secondary | ICD-10-CM | POA: Diagnosis not present

## 2021-11-23 LAB — CBC
HCT: 35.8 % — ABNORMAL LOW (ref 36.0–46.0)
Hemoglobin: 12.3 g/dL (ref 12.0–15.0)
MCH: 26.2 pg (ref 26.0–34.0)
MCHC: 34.4 g/dL (ref 30.0–36.0)
MCV: 76.3 fL — ABNORMAL LOW (ref 80.0–100.0)
Platelets: 277 10*3/uL (ref 150–400)
RBC: 4.69 MIL/uL (ref 3.87–5.11)
RDW: 14 % (ref 11.5–15.5)
WBC: 12.4 10*3/uL — ABNORMAL HIGH (ref 4.0–10.5)
nRBC: 0 % (ref 0.0–0.2)

## 2021-11-23 LAB — BASIC METABOLIC PANEL
Anion gap: 13 (ref 5–15)
BUN: <5 mg/dL — ABNORMAL LOW (ref 6–20)
CO2: 25 mmol/L (ref 22–32)
Calcium: 8.7 mg/dL — ABNORMAL LOW (ref 8.9–10.3)
Chloride: 94 mmol/L — ABNORMAL LOW (ref 98–111)
Creatinine, Ser: 0.63 mg/dL (ref 0.44–1.00)
GFR, Estimated: >60 mL/min (ref >60)
Glucose, Bld: 106 mg/dL — ABNORMAL HIGH (ref 70–99)
Potassium: 3.1 mmol/L — ABNORMAL LOW (ref 3.5–5.1)
Sodium: 132 mmol/L — ABNORMAL LOW (ref 135–145)

## 2021-11-23 LAB — CULTURE, BLOOD (ROUTINE X 2): Special Requests: ADEQUATE

## 2021-11-23 LAB — GLUCOSE, CAPILLARY
Glucose-Capillary: 109 mg/dL — ABNORMAL HIGH (ref 70–99)
Glucose-Capillary: 128 mg/dL — ABNORMAL HIGH (ref 70–99)
Glucose-Capillary: 114 mg/dL — ABNORMAL HIGH (ref 70–99)
Glucose-Capillary: 188 mg/dL — ABNORMAL HIGH (ref 70–99)

## 2021-11-23 LAB — POTASSIUM: Potassium: 3 mmol/L — ABNORMAL LOW (ref 3.5–5.1)

## 2021-11-23 LAB — MAGNESIUM: Magnesium: 2.1 mg/dL (ref 1.7–2.4)

## 2021-11-23 LAB — GC/CHLAMYDIA PROBE AMP (~~LOC~~) NOT AT ARMC
Chlamydia: NEGATIVE
Comment: NEGATIVE
Comment: NORMAL
Neisseria Gonorrhea: NEGATIVE

## 2021-11-23 MED ORDER — CEFAZOLIN SODIUM-DEXTROSE 1-4 GM/50ML-% IV SOLN
1.0000 g | Freq: Once | INTRAVENOUS | Status: AC
Start: 2021-11-23 — End: 2021-11-23
  Administered 2021-11-23: 1 g via INTRAVENOUS
  Filled 2021-11-23: qty 50

## 2021-11-23 MED ORDER — DIPHENHYDRAMINE HCL 50 MG/ML IJ SOLN: 25.0000 mg | Freq: Once | INTRAMUSCULAR | Status: DC | PRN

## 2021-11-23 MED ORDER — CEFAZOLIN SODIUM-DEXTROSE 1-4 GM/50ML-% IV SOLN
1.0000 g | Freq: Three times a day (TID) | INTRAVENOUS | Status: DC
Start: 2021-11-23 — End: 2021-11-29
  Administered 2021-11-23 – 2021-11-29 (×17): 1 g via INTRAVENOUS
  Filled 2021-11-23 (×20): qty 50

## 2021-11-23 MED ORDER — POTASSIUM CHLORIDE CRYS ER 20 MEQ PO TBCR
40.0000 meq | EXTENDED_RELEASE_TABLET | ORAL | Status: AC
  Administered 2021-11-23 – 2021-11-24 (×2): 40 meq via ORAL
  Filled 2021-11-23 (×2): qty 2

## 2021-11-23 MED ORDER — POTASSIUM CHLORIDE CRYS ER 20 MEQ PO TBCR
40.0000 meq | EXTENDED_RELEASE_TABLET | Freq: Two times a day (BID) | ORAL | Status: DC
  Administered 2021-11-23: 40 meq via ORAL
  Filled 2021-11-23: qty 2

## 2021-11-23 MED ORDER — POTASSIUM CHLORIDE 20 MEQ PO PACK
40.0000 meq | PACK | Freq: Once | ORAL | Status: DC
  Filled 2021-11-23: qty 2

## 2021-11-23 MED ORDER — EPINEPHRINE 0.3 MG/0.3ML IJ SOAJ: 0.3000 mg | Freq: Once | INTRAMUSCULAR | Status: DC | PRN

## 2021-11-23 NOTE — Progress Notes (Signed)
NAME:  Leah Rose, MRN:  [id number], DOB:  [birth year], LOS: 2 ADMISSION DATE:  11/20/2021  Subjective  Patient evaluated at bedside this AM. Patient reports improving left flank pain and no subjective fevers. She denies any chest pain or shortness of breath or N/V. She has good appetite but doesn't like the food. She is urinating fine but has not had a BM and normally has 1 daily.  Objective   Blood pressure (!) 146/102, pulse 100, temperature 99.6 F (37.6 C), temperature source Oral, resp. rate 16, height 4\' 11"  (1.499 m), weight 45.8 kg, SpO2 96 %.     Intake/Output Summary (Last 24 hours) at 11/23/2021 1551 Last data filed at 11/23/2021 1400 Gross per 24 hour  Intake 411.94 ml  Output 1000 ml  Net -588.06 ml    Filed Weights   11/21/21 0901  Weight: 45.8 kg   Physical Exam: General: Laying in bed, pleasant,no distress, non-toxic HEENT: Normocephalic, atraumatic, poor dentition, no discrete abscess or site of infection, question leukoplakia on the right lower gumline CV: Regular rate, rhythm, no m/r/g Pulm: lungs clear to auscultation bilaterally, good airflow bilaterally Abdomen: Soft, non-tender, non-distended. Normoactive bowel sounds. No CVA tenderness Skin: Rough, scaling patch on L neck, no open wounds appreciated. Evidence of recently popped boil on the right arm without erythema, fluctuance or discharge. Skin is diffusely hot to the touch. MSK: No bony spinal pain, some tightness and worsening left flank pain with straight leg test, some flank pain with palpation Neuro: Awake, alert, oriented  Labs       Latest Ref Rng & Units 11/23/2021    6:58 AM 11/21/2021    6:52 AM 11/20/2021    3:10 PM  CBC  WBC 4.0 - 10.5 K/uL 12.4  8.7  9.4   Hemoglobin 12.0 - 15.0 g/dL 11/22/2021  65.6  81.2   Hematocrit 36.0 - 46.0 % 35.8  36.5  42.7   Platelets 150 - 400 K/uL 277  240  280       Latest Ref Rng & Units 11/23/2021    6:58 AM 11/21/2021    6:52 AM 11/20/2021    5:05 PM   BMP  Glucose 70 - 99 mg/dL 11/22/2021  96  700   BUN 6 - 20 mg/dL 5  5  11    Creatinine 0.44 - 1.00 mg/dL 174  9.44  9.67   Sodium 135 - 145 mmol/L 132  133  136   Potassium 3.5 - 5.1 mmol/L 3.1  3.6  4.0   Chloride 98 - 111 mmol/L 94  102  110   CO2 22 - 32 mmol/L 25  21  19    Calcium 8.9 - 10.3 mg/dL 8.7  8.4  8.0    Summary   Leah Rose is 51yo with tobacco use disorder, chronic venous insufficiency, hypertension, TIA admitted 8/19 for cephalosporin allergic reaction and found to have MRSA bacteremia.  Assessment & Plan:  Principal Problem:   Sepsis secondary to UTI (HCC)  MRSA bacteremia E. coli urinary tract infection 1/2 cultures repeated on 11/21/2021 is without growth of MRSA at <24 hrs, the other is yet to result. WBC increased from 8.7 to 12.4 and patient is persistently tachycardic, although patient has remained afebrile for the past 24 hours without hypotension. It is unclear why the patient has this interval increase in WBC but this could represent secondary seeding with abscess formation or the need for an alternate medication that isnt vancomycin. Will reevaluate with morning labs.  Will continue to monitor and reassess need for further imaging, but will hold off as patient is reporting feeling better and having improvement in her left flank pain without any evidence of abscess or osteomyelitis on CT A/P. TTE was negative and will collaborate with ID to determine the need for TEE moving forward and further work up for increasing WBC. - PRN tylenol for fever and pain - Follow-up repeat blood cultures - Avoid cephalosporins, penicillins, sulfonamides d/t allergies -Consider TEE  -ID following  Atopic dermatitis Pruritic patch on left side of neck most consistent with atopic dermatitis. She does wear a necklace regularly (her father's ashes), but this is not a new piece of jewelry. Contact dermatitis less likely. No vesicles, satellite lesions, bullae appreciated. Will provide  topical corticosteroid cream for symptomatic relief. - Triamcinolone cream  L flank pain Patient says this was an acute onset flank pain without trauma. Denies hematuria or dysuria and has no cva tenderness. CT A/P with no intraabdominal infection. Lidocaine patch has provided no relief. Do not believe this is pyelonephritis or a superficial traumatic msk etiology. There is mild ptp today and worsening pain with straight leg test so question msk source.  Best practice:  DIET: CM IVF: na DVT PPX: lovenox BOWEL: na CODE: FULL FAM COM: na  Evlyn Kanner, MD Internal Medicine Resident PGY-3 PAGER: [phone] 11/23/2021 3:51 PM  If after hours (below), please contact on-call pager: [phone] 5PM-7AM Monday-Friday 1PM-7AM Saturday-Sunday

## 2021-11-23 NOTE — Progress Notes (Signed)
[facility] Infectious Diseases Follow Up Note  Patient Identification: Patient Name: Leah Rose MRN: [id number] Admit Date: 11/20/2021 12:19 PM Age: 51 y.o.Today's Date: 11/23/2021  Reason for Visit: MRSA bacteremia and pyelonephritis   Principal Problem:   Sepsis secondary to UTI [facility](HCC)  Antibiotics:  Cephalexin 81/8 Ciprofloxacin 81/9-8/20 Vancomycin 8/20-   Lines/Hardware: PIV   Interval Events:   Assessment 51 year old female with PMH of HTN, depression, chronic venous insufficiency who presented to the ED with persistent left flank pain, allergic reaction to cephalexin. Admitted for    # # Clinical left sided pyelonephritis ( fever and left sided back/flankpain) 8/18 Urine cx E coli  8/19 Urine E coli , R to cipro   MRSA bacteremia - she has a ruptured boil in her rt arm and likely the cause for MRSA bacteremia. She complains of left lower back pain. No spinal tenderness, no signs of septic peripheral joints or no known hardwares TTE 8/21 unremarkable for vegetations or endocarditis   # Allergy to Penicillin/Cephalexin/Sulpha- had hives/itching rashes with penicillin more than 10 years ago. She recently had hives, itching and rashes after 2 doses of cephalexin with facial swelling, received IM epinephrine from EMS. She also had  hives, rashes with sulfa antibiotics in the past. Given multiple allergies, discussed with her one dose of IV cefazolin trial as cefazolin has a different side chain than cephalexin and less likely to cross react. She is willing to trial. Discussed with her RN about the plan along with ID Pharm D.   Recommendations Continue Vancomycin, pharmacy to dose Fu repeat blood cx DC Ciprofloxacin and will do one dose of IV cefazolin ( nursing to monitor vitals q 15 m for 1 hr). If tolerates OK, will switch to treatment dose of IV cefazolin for pyelonephritis  Will consider to forego TEE if repeat blood  cx 8/19 are negative  Following  Rest of the management as per the primary team. Thank you for the consult. Please page with pertinent questions or concerns.  ______________________________________________________________________ Subjective patient seen and examined at the bedside.  Left lower back pain is better She is agreeable to trial IV cefazolin   Vitals BP (!) 146/102 (BP Location: Left Arm)   Pulse 100   Temp 99.6 F (37.6 C) (Oral)   Resp 16   Ht 4\' 11"  (1.499 m)   Wt 45.8 kg   SpO2 96%   BMI 20.40 kg/m     Physical Exam Constitutional: lying in the bed, appears comfortable     Comments:   Cardiovascular:     Rate and Rhythm: Normal rate and regular rhythm.     Heart sounds:  Pulmonary:     Effort: Pulmonary effort is normal on room air     Comments:   Abdominal:     Palpations: Abdomen is soft.     Tenderness: non distended   Musculoskeletal:        General: No swelling or tenderness. No midline spinal tenderness, mild tenderness at left lower back   Skin:    Comments: Rt arm ruptured boil   Neurological:     General: grossly non focal, awake, alert and oriented   Psychiatric:        Mood and Affect: Mood normal.   Pertinent Microbiology Results for orders placed or performed during the hospital encounter of 11/20/21  Resp Panel by RT-PCR (Flu A&B, Covid) Anterior Nasal Swab     Status: None   Collection Time: 11/20/21  4:18 PM  Specimen: Anterior Nasal Swab  Result Value Ref Range Status   SARS Coronavirus 2 by RT PCR NEGATIVE NEGATIVE Final    Comment: (NOTE) SARS-CoV-2 target nucleic acids are NOT DETECTED.  The SARS-CoV-2 RNA is generally detectable in upper respiratory specimens during the acute phase of infection. The lowest concentration of SARS-CoV-2 viral copies this assay can detect is 138 copies/mL. A negative result does not preclude SARS-Cov-2 infection and should not be used as the sole basis for treatment or other patient  management decisions. A negative result may occur with  improper specimen collection/handling, submission of specimen other than nasopharyngeal swab, presence of viral mutation(s) within the areas targeted by this assay, and inadequate number of viral copies(<138 copies/mL). A negative result must be combined with clinical observations, patient history, and epidemiological information. The expected result is Negative.  Fact Sheet for Patients:  BloggerCourse.com  Fact Sheet for Healthcare Providers:  SeriousBroker.it  This test is no t yet approved or cleared by the [place] FDA and  has been authorized for detection and/or diagnosis of SARS-CoV-2 by FDA under an Emergency Use Authorization (EUA). This EUA will remain  in effect (meaning this test can be used) for the duration of the COVID-19 declaration under Section 564(b)(1) of the Act, 21 U.S.C.section 360bbb-3(b)(1), unless the authorization is terminated  or revoked sooner.       Influenza A by PCR NEGATIVE NEGATIVE Final   Influenza B by PCR NEGATIVE NEGATIVE Final    Comment: (NOTE) The Xpert Xpress SARS-CoV-2/FLU/RSV plus assay is intended as an aid in the diagnosis of influenza from Nasopharyngeal swab specimens and should not be used as a sole basis for treatment. Nasal washings and aspirates are unacceptable for Xpert Xpress SARS-CoV-2/FLU/RSV testing.  Fact Sheet for Patients: BloggerCourse.com  Fact Sheet for Healthcare Providers: SeriousBroker.it  This test is not yet approved or cleared by the [place] FDA and has been authorized for detection and/or diagnosis of SARS-CoV-2 by FDA under an Emergency Use Authorization (EUA). This EUA will remain in effect (meaning this test can be used) for the duration of the COVID-19 declaration under Section 564(b)(1) of the Act, 21 U.S.C. section 360bbb-3(b)(1),  unless the authorization is terminated or revoked.  Performed at [facility], [address], [place], [place] [address]   Blood Culture (routine x 2)     Status: Abnormal   Collection Time: 11/20/21  4:18 PM   Specimen: BLOOD RIGHT FOREARM  Result Value Ref Range Status   Specimen Description BLOOD RIGHT FOREARM  Final   Special Requests   Final    BOTTLES DRAWN AEROBIC AND ANAEROBIC Blood Culture adequate volume   Culture  Setup Time   Final    GRAM POSITIVE COCCI IN BOTH AEROBIC AND ANAEROBIC BOTTLES CRITICAL RESULT CALLED TO, READ BACK BY AND VERIFIED WITH: PHARMD C PIERCE [id number] AT 752 AM BY CM Performed at [facility], [address] [place], [place] [address]    Culture METHICILLIN RESISTANT STAPHYLOCOCCUS AUREUS (A)  Final   Report Status 11/23/2021 FINAL  Final   Organism ID, Bacteria METHICILLIN RESISTANT STAPHYLOCOCCUS AUREUS  Final      Susceptibility   Methicillin resistant staphylococcus aureus - MIC*    CIPROFLOXACIN <=0.5 SENSITIVE Sensitive     ERYTHROMYCIN <=0.25 SENSITIVE Sensitive     GENTAMICIN <=0.5 SENSITIVE Sensitive     OXACILLIN >=4 RESISTANT Resistant     TETRACYCLINE <=1 SENSITIVE Sensitive     VANCOMYCIN 1 SENSITIVE Sensitive  TRIMETH/SULFA <=10 SENSITIVE Sensitive     CLINDAMYCIN <=0.25 SENSITIVE Sensitive     RIFAMPIN <=0.5 SENSITIVE Sensitive     Inducible Clindamycin NEGATIVE Sensitive     * METHICILLIN RESISTANT STAPHYLOCOCCUS AUREUS  Urine Culture     Status: Abnormal   Collection Time: 11/20/21  4:18 PM   Specimen: In/Out Cath Urine  Result Value Ref Range Status   Specimen Description IN/OUT CATH URINE  Final   Special Requests   Final    NONE Performed at [facility], [address], [place], [place] [address]    Culture >=100,000 COLONIES/mL ESCHERICHIA COLI (A)  Final   Report Status 11/23/2021 FINAL  Final   Organism ID, Bacteria ESCHERICHIA COLI (A)  Final      Susceptibility   Escherichia coli - MIC*     AMPICILLIN 8 SENSITIVE Sensitive     CEFAZOLIN <=4 SENSITIVE Sensitive     CEFTRIAXONE <=0.25 SENSITIVE Sensitive     CIPROFLOXACIN >=4 RESISTANT Resistant     GENTAMICIN <=1 SENSITIVE Sensitive     IMIPENEM <=0.25 SENSITIVE Sensitive     NITROFURANTOIN <=16 SENSITIVE Sensitive     TRIMETH/SULFA <=20 SENSITIVE Sensitive     AMPICILLIN/SULBACTAM <=2 SENSITIVE Sensitive     * >=100,000 COLONIES/mL ESCHERICHIA COLI  Blood Culture ID Panel (Reflexed)     Status: Abnormal   Collection Time: 11/20/21  4:18 PM  Result Value Ref Range Status   Enterococcus faecalis NOT DETECTED NOT DETECTED Final   Enterococcus Faecium NOT DETECTED NOT DETECTED Final   Listeria monocytogenes NOT DETECTED NOT DETECTED Final   Staphylococcus species DETECTED (A) NOT DETECTED Final    Comment: CRITICAL RESULT CALLED TO, READ BACK BY AND VERIFIED WITH: PHARMD C PIERCE [id number] AT 751 AM BY CM    Staphylococcus aureus (BCID) DETECTED (A) NOT DETECTED Final    Comment: Methicillin (oxacillin)-resistant Staphylococcus aureus (MRSA). MRSA is predictably resistant to beta-lactam antibiotics (except ceftaroline). Preferred therapy is vancomycin unless clinically contraindicated. Patient requires contact precautions if  hospitalized. CRITICAL RESULT CALLED TO, READ BACK BY AND VERIFIED WITH: PHARMD C PIERCE [id number] AT 751 AM BY CM    Staphylococcus epidermidis NOT DETECTED NOT DETECTED Final   Staphylococcus lugdunensis NOT DETECTED NOT DETECTED Final   Streptococcus species NOT DETECTED NOT DETECTED Final   Streptococcus agalactiae NOT DETECTED NOT DETECTED Final   Streptococcus pneumoniae NOT DETECTED NOT DETECTED Final   Streptococcus pyogenes NOT DETECTED NOT DETECTED Final   A.calcoaceticus-baumannii NOT DETECTED NOT DETECTED Final   Bacteroides fragilis NOT DETECTED NOT DETECTED Final   Enterobacterales NOT DETECTED NOT DETECTED Final   Enterobacter cloacae complex NOT DETECTED NOT DETECTED Final    Escherichia coli NOT DETECTED NOT DETECTED Final   Klebsiella aerogenes NOT DETECTED NOT DETECTED Final   Klebsiella oxytoca NOT DETECTED NOT DETECTED Final   Klebsiella pneumoniae NOT DETECTED NOT DETECTED Final   Proteus species NOT DETECTED NOT DETECTED Final   Salmonella species NOT DETECTED NOT DETECTED Final   Serratia marcescens NOT DETECTED NOT DETECTED Final   Haemophilus influenzae NOT DETECTED NOT DETECTED Final   Neisseria meningitidis NOT DETECTED NOT DETECTED Final   Pseudomonas aeruginosa NOT DETECTED NOT DETECTED Final   Stenotrophomonas maltophilia NOT DETECTED NOT DETECTED Final   Candida albicans NOT DETECTED NOT DETECTED Final   Candida auris NOT DETECTED NOT DETECTED Final   Candida glabrata NOT DETECTED NOT DETECTED Final   Candida krusei NOT DETECTED NOT DETECTED Final   Candida parapsilosis NOT DETECTED  NOT DETECTED Final   Candida tropicalis NOT DETECTED NOT DETECTED Final   Cryptococcus neoformans/gattii NOT DETECTED NOT DETECTED Final   Meth resistant mecA/C and MREJ DETECTED (A) NOT DETECTED Final    Comment: CRITICAL RESULT CALLED TO, READ BACK BY AND VERIFIED WITH: PHARMD C PIERCE [id number] AT 751 AM BY CM Performed at [facility], [address], [place], [place] [address]   Culture, blood (Routine X 2) w Reflex to ID Panel     Status: None (Preliminary result)   Collection Time: 11/21/21  8:21 AM   Specimen: BLOOD  Result Value Ref Range Status   Specimen Description BLOOD SITE NOT SPECIFIED  Final   Special Requests   Final    BOTTLES DRAWN AEROBIC AND ANAEROBIC Blood Culture results may not be optimal due to an inadequate volume of blood received in culture bottles   Culture   Final    NO GROWTH < 24 HOURS Performed at [facility], [address], [place], [place] [address]    Report Status PENDING  Incomplete  MRSA Next Gen by PCR, Nasal     Status: None   Collection Time: 11/21/21  1:46 PM   Specimen: Nasal Mucosa; Nasal Swab   Result Value Ref Range Status   MRSA by PCR Next Gen NOT DETECTED NOT DETECTED Final    Comment: (NOTE) The GeneXpert MRSA Assay (FDA approved for NASAL specimens only), is one component of a comprehensive MRSA colonization surveillance program. It is not intended to diagnose MRSA infection nor to guide or monitor treatment for MRSA infections. Test performance is not FDA approved in patients less than 58 years old. Performed at [facility], [address], [place], [place] [address]     Pertinent Lab.    Latest Ref Rng & Units 11/23/2021    6:58 AM 11/21/2021    6:52 AM 11/20/2021    3:10 PM  CBC  WBC 4.0 - 10.5 K/uL 12.4  8.7  9.4   Hemoglobin 12.0 - 15.0 g/dL 05.6  97.9  48.0   Hematocrit 36.0 - 46.0 % 35.8  36.5  42.7   Platelets 150 - 400 K/uL 277  240  280       Latest Ref Rng & Units 11/23/2021    6:58 AM 11/21/2021    6:52 AM 11/20/2021    5:05 PM  CMP  Glucose 70 - 99 mg/dL 165  96  537   BUN 6 - 20 mg/dL 5  5  11    Creatinine 0.44 - 1.00 mg/dL 4.82  7.07  8.67   Sodium 135 - 145 mmol/L 132  133  136   Potassium 3.5 - 5.1 mmol/L 3.1  3.6  4.0   Chloride 98 - 111 mmol/L 94  102  110   CO2 22 - 32 mmol/L 25  21  19    Calcium 8.9 - 10.3 mg/dL 8.7  8.4  8.0   Total Protein 6.5 - 8.1 g/dL   5.4   Total Bilirubin 0.3 - 1.2 mg/dL   0.6   Alkaline Phos 38 - 126 U/L   45   AST 15 - 41 U/L   25   ALT 0 - 44 U/L   17      Pertinent Imaging today Plain films and CT images have been personally visualized and interpreted; radiology reports have been reviewed. Decision making incorporated into the Impression / Recommendations.  No results found.  I spent 50  minutes for this patient encounter including review of prior medical records, coordination of care with primary/other specialist with greater than 50% of time being face to face/counseling and discussing diagnostics/treatment plan with the patient/family.  Electronically signed by:   Odette Fraction,  MD Infectious Disease Physician [facility] for Infectious Disease Pager: [phone]

## 2021-11-23 NOTE — Progress Notes (Signed)
Noted hypertension , no prns to treat. Informed Resident on call .

## 2021-11-23 NOTE — Progress Notes (Addendum)
Infectious Disease: Cefazolin Test Dose  51 YOF with E.coli UTI resistant to Cipro. The patient has a history of PCN/sulfa allergy of hives/rash with a recent reaction to oral cephalexin associated with itching/rash/facial swelling.   There are limited narrow options for treatment of her UTI given her allergy history. Cefazolin is a 1st-generation cephalosporin like cephalexin however does not share a R1 side chain that can be found with penicillins/early class cephalosporins and therefore is safe to trial in this patient.   The plan to try a Cefazolin test dose was discussed with both the patient and RN. The patient is amendable to try and agreeable to the plan. RN aware of prn meds available and checking vitals.    Plan - Cefazolin 1g IV x 1 dose - Prn bendaryl and epi-pen if needed - RN to check vitals q14min for 1 hour after the dose - Will follow-up on tolerance and ability to utilize this option in this patient  Addendum: The patient tolerated her test dose without issues per discussion with the RN and patient. The plan will be to continue to utilize Cefazolin to treat her E.coli UTI.  11/23/2021 3:35 PM   Thank you for allowing pharmacy to be a part of this patient's care.  Georgina Pillion, PharmD, BCPS Infectious Diseases Clinical Pharmacist 11/23/2021 12:17 PM   **Pharmacist phone directory can now be found on [email or web address] ([email or web address]).  Listed under [facility].

## 2021-11-23 NOTE — Evaluation (Signed)
Physical Therapy Evaluation Patient Details Name: Leah Rose MRN: [id number] DOB: 1970/05/19 Today's Date: 11/23/2021  History of Present Illness  Pt is 51 yo female presented with persistent L flank pain on 11/20/21.  Pt found to have MRSA bacteremia/Ecoli UTI with sepsis.  Pt with hx including chronic venous insufficiency, HTN, TIA  Clinical Impression  Pt admitted with above diagnosis. At baseline, pt works, lives alone, and is mod I.  She does use cane or RW for ambulation.  Pt has c/o L flank pain -worsened with transfers (severe pain symptoms with transfers) and dizziness worsened when EOB.  Tried to further test and obtain hx related to the pain and dizziness but pt reports feeling bad, wanting to rest, and covers up.  Did have some difficulty with smooth pursuit but no nystagmus noted.  Blood pressures did drop in sitting but were still elevated 139/99.  Expect that pt will be able to progress back to [facility] level as pain and medical status improve - if no progress may need SNF.  Will continue to assess pain and dizziness as able.Pt currently with functional limitations due to the deficits listed below (see PT Problem List). Pt will benefit from skilled PT to increase their independence and safety with mobility to allow discharge to the venue listed below.          Recommendations for follow up therapy are one component of a multi-disciplinary discharge planning process, led by the attending physician.  Recommendations may be updated based on patient status, additional functional criteria and insurance authorization.  Follow Up Recommendations Home health PT (if able to progress)      Assistance Recommended at Discharge Frequent or constant Supervision/Assistance  Patient can return home with the following  A lot of help with walking and/or transfers;A lot of help with bathing/dressing/bathroom;Assistance with cooking/housework;Help with stairs or ramp for entrance    Equipment  Recommendations None recommended by PT  Recommendations for Other Services       Functional Status Assessment Patient has had a recent decline in their functional status and demonstrates the ability to make significant improvements in function in a reasonable and predictable amount of time.     Precautions / Restrictions Precautions Precautions: Fall      Mobility  Bed Mobility Overal bed mobility: Needs Assistance Bed Mobility: Sidelying to Sit, Sit to Sidelying   Sidelying to sit: Mod assist     Sit to sidelying: Max assist General bed mobility comments: To sit: increased time due to L flank pain, pt had episode of "sharp catch" that caused her to lean back, improved with mod A bed pad to scoot forward; Back to supine: Max A with legs and trunk for pain control    Transfers                   General transfer comment: unable: pt denied, pain, dizzy    Ambulation/Gait                  Stairs            Wheelchair Mobility    Modified Rankin (Stroke Patients Only)       Balance Overall balance assessment: Needs assistance Sitting-balance support: No upper extremity supported Sitting balance-Leahy Scale: Fair         Standing balance comment: deferred  Pertinent Vitals/Pain Pain Assessment Pain Assessment: 0-10 Pain Score: 8  Pain Location: L flank with transfers Pain Descriptors / Indicators: Grimacing, Guarding, Sharp ("catching") Pain Intervention(s): Limited activity within patient's tolerance, Monitored during session, Repositioned, Relaxation    Home Living Family/patient expects to be discharged to:: Private residence Living Arrangements: Alone Available Help at Discharge: Friend(s);Available PRN/intermittently Type of Home: Apartment Home Access: Level entry       Home Layout: One level Home Equipment: Agricultural consultant (2 wheels);Cane - single point      Prior Function Prior Level  of Function : Independent/Modified Independent;Driving;Working/employed             Mobility Comments: Used a cane at work and walker at home ADLs Comments: independent with IADLs     Hand Dominance        Extremity/Trunk Assessment   Upper Extremity Assessment Upper Extremity Assessment: Generalized weakness (Limited testing possible due to pt lethargic and L flank pain)    Lower Extremity Assessment Lower Extremity Assessment: Generalized weakness (Limited testing possible due to pt lethargic and L flank pain)    Cervical / Trunk Assessment Cervical / Trunk Assessment: Normal  Communication   Communication: No difficulties  Cognition Arousal/Alertness: Awake/alert Behavior During Therapy: Flat affect Overall Cognitive Status: History of cognitive impairments - at baseline                                 General Comments: 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  BP during session as follows :159/99 supine; 139/95 sitting; 139/97 return to supine  Pt had c/o dizziness with transfers.  BP did have drop with transfer to sitting but was still 139/95.  Reports better in supine.  Attempted testing smooth pursuit at EOB but pt not tolerating.  Once in supine attempted visual testing - pt had difficulty with smooth pursuit (but also low participation), test of skew negative, denied dizziness with head turns. Pt also not forthcoming with information or participatory with test- reports feeling bad, wanting to rest, turns over on side and covers up.    Exercises     Assessment/Plan    PT Assessment Patient needs continued PT services  PT Problem List Decreased strength;Decreased mobility;Decreased range of motion;Decreased coordination;Decreased knowledge of precautions;Decreased activity tolerance;Decreased  cognition;Cardiopulmonary status limiting activity;Decreased balance;Decreased knowledge of use of DME;Pain       PT Treatment Interventions DME instruction;Therapeutic activities;Modalities;Gait training;Therapeutic exercise;Patient/family education;Balance training;Functional mobility training    PT Goals (Current goals can be found in the Care Plan section)  Acute Rehab PT Goals Patient Stated Goal: decrease pain PT Goal Formulation: With patient/family Time For Goal Achievement: 12/07/21 Potential to Achieve Goals: Good    Frequency Min 3X/week     Co-evaluation               AM-PAC PT "6 Clicks" Mobility  Outcome Measure Help needed turning from your back to your side while in a flat bed without using bedrails?: None Help needed moving from lying on your back to sitting on the side of a flat bed without using bedrails?: A Lot Help needed moving to and from a bed to a chair (including a wheelchair)?: A Lot Help needed standing up from a chair using your arms (e.g., wheelchair or  bedside chair)?: Total Help needed to walk in hospital room?: Total Help needed climbing 3-5 steps with a railing? : A Lot 6 Click Score: 12    End of Session Equipment Utilized During Treatment: Gait belt Activity Tolerance: Patient limited by pain;Patient limited by lethargy;Other (comment) (limited by wanting to rest) Patient left: in bed;with call bell/phone within reach;with bed alarm set Nurse Communication: Mobility status PT Visit Diagnosis: Unsteadiness on feet (R26.81);Other abnormalities of gait and mobility (R26.89);Pain Pain - Right/Left: Left Pain - part of body:  (back/flank)    Time: 9371-6967 PT Time Calculation (min) (ACUTE ONLY): 24 min   Charges:   PT Evaluation $PT Eval Moderate Complexity: 1 Mod PT Treatments $Therapeutic Activity: 8-22 mins        Anise Salvo, PT Acute Rehab [facility] Rehab [phone]   Rayetta Humphrey 11/23/2021, 5:06 PM

## 2021-11-24 ENCOUNTER — Inpatient Hospital Stay (HOSPITAL_COMMUNITY): Payer: 59

## 2021-11-24 LAB — BASIC METABOLIC PANEL
Anion gap: 10 (ref 5–15)
BUN: 6 mg/dL (ref 6–20)
CO2: 24 mmol/L (ref 22–32)
Calcium: 9 mg/dL (ref 8.9–10.3)
Chloride: 97 mmol/L — ABNORMAL LOW (ref 98–111)
Creatinine, Ser: 0.6 mg/dL (ref 0.44–1.00)
GFR, Estimated: >60 mL/min (ref >60)
Glucose, Bld: 109 mg/dL — ABNORMAL HIGH (ref 70–99)
Potassium: 4.6 mmol/L (ref 3.5–5.1)
Sodium: 131 mmol/L — ABNORMAL LOW (ref 135–145)

## 2021-11-24 LAB — SUSCEPTIBILITY, AER + ANAEROB

## 2021-11-24 LAB — CBC
HCT: 38.3 % (ref 36.0–46.0)
Hemoglobin: 12.9 g/dL (ref 12.0–15.0)
MCH: 25.7 pg — ABNORMAL LOW (ref 26.0–34.0)
MCHC: 33.7 g/dL (ref 30.0–36.0)
MCV: 76.4 fL — ABNORMAL LOW (ref 80.0–100.0)
Platelets: 307 10*3/uL (ref 150–400)
RBC: 5.01 MIL/uL (ref 3.87–5.11)
RDW: 13.7 % (ref 11.5–15.5)
WBC: 14.6 10*3/uL — ABNORMAL HIGH (ref 4.0–10.5)
nRBC: 0 % (ref 0.0–0.2)

## 2021-11-24 LAB — GLUCOSE, CAPILLARY
Glucose-Capillary: 117 mg/dL — ABNORMAL HIGH (ref 70–99)
Glucose-Capillary: 123 mg/dL — ABNORMAL HIGH (ref 70–99)
Glucose-Capillary: 159 mg/dL — ABNORMAL HIGH (ref 70–99)
Glucose-Capillary: 157 mg/dL — ABNORMAL HIGH (ref 70–99)

## 2021-11-24 LAB — VANCOMYCIN, PEAK: Vancomycin Pk: 14 ug/mL — ABNORMAL LOW (ref 30–40)

## 2021-11-24 LAB — SUSCEPTIBILITY RESULT

## 2021-11-24 MED ORDER — IOHEXOL 300 MG/ML  SOLN
80.0000 mL | Freq: Once | INTRAMUSCULAR | Status: AC | PRN
  Administered 2021-11-24: 80 mL via INTRAVENOUS

## 2021-11-24 MED ORDER — VANCOMYCIN HCL 750 MG/150ML IV SOLN
750.0000 mg | Freq: Two times a day (BID) | INTRAVENOUS | Status: DC
  Administered 2021-11-24 – 2021-11-26 (×4): 750 mg via INTRAVENOUS
  Filled 2021-11-24 (×4): qty 150

## 2021-11-24 NOTE — Progress Notes (Signed)
  Transition of Care [facility]) Screening Note   Patient Details  Name: Leah Rose Date of Birth: 12/24/1970   Transition of Care [facility]) CM/SW Contact:    Harriet Masson, RN Phone Number: 11/24/2021, 9:03 AM    Transition of Care Department [facility]) has reviewed patient and no TOC needs have been identified at this time. We will continue to monitor patient advancement through interdisciplinary progression rounds. If new patient transition needs arise, please place a TOC consult.

## 2021-11-24 NOTE — Progress Notes (Signed)
Physical Therapy Treatment Patient Details Name: Leah Rose MRN: [id number] DOB: Aug 07, 1970 Today's Date: 11/24/2021   History of Present Illness Pt is 51 yo female presented with persistent L flank pain on 11/20/21.  Pt found to have MRSA bacteremia/Ecoli UTI with sepsis.  Pt with hx including chronic venous insufficiency, HTN, TIA    PT Comments    Pt received in supine, agreeable to therapy session with max encouragement and with fair participation and tolerance for transfer training. Pt denies dizziness and BP stable when checked supine, seated and standing (vitals in flowsheet) but self-limiting due to pain and fatigue.  Encouraged pt to get up to chair more often during the day for improved tolerance to upright posture, pt not allowing bed to be elevated >20 degrees. Heating pack given for low back pain per pt request. Pt continues to benefit from PT services to progress toward functional mobility goals.   Recommendations for follow up therapy are one component of a multi-disciplinary discharge planning process, led by the attending physician.  Recommendations may be updated based on patient status, additional functional criteria and insurance authorization.  Follow Up Recommendations  Home health PT (if able to progress)     Assistance Recommended at Discharge Frequent or constant Supervision/Assistance  Patient can return home with the following A lot of help with walking and/or transfers;A lot of help with bathing/dressing/bathroom;Assistance with cooking/housework;Help with stairs or ramp for entrance   Equipment Recommendations  None recommended by PT    Recommendations for Other Services       Precautions / Restrictions Precautions Precautions: Fall Restrictions Weight Bearing Restrictions: No     Mobility  Bed Mobility Overal bed mobility: Needs Assistance Bed Mobility: Rolling, Sidelying to Sit, Sit to Supine Rolling: Modified independent (Device/Increase time)    Supine to sit: Supervision Sit to supine: Supervision   General bed mobility comments: To sit: increased time due to L flank pain, no lift assist but pt heavily reliant on bed rail    Transfers Overall transfer level: Needs assistance Equipment used: Rolling walker (2 wheels) Transfers: Sit to/from Stand, Bed to chair/wheelchair/BSC Sit to Stand: Min assist           General transfer comment: pt reports no dizziness with standing but low back pain limiting tolerance; RW for comfort during BP reading while standing      Balance Overall balance assessment: Needs assistance Sitting-balance support: Bilateral upper extremity supported Sitting balance-Leahy Scale: Fair Sitting balance - Comments: pt needing 1-2 UE support sitting EOB due to low back pain; improved posture after PTA assisted her to foot flat with bed pad assist   Standing balance support: Reliant on assistive device for balance Standing balance-Leahy Scale: Poor Standing balance comment: reliant on RW for standing >1 minute due to low back pain                            Cognition Arousal/Alertness: Awake/alert Behavior During Therapy: WFL for tasks assessed/performed Overall Cognitive Status: History of cognitive impairments - at baseline                                 General Comments: 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  Exercises      General Comments General comments (skin integrity, edema, etc.): VSS on RA; BP stable with supine/seated/standing transfers today      Pertinent Vitals/Pain Pain Assessment Pain Assessment: Faces Faces Pain Scale: Hurts little more Pain Location: low back pain with movement Pain Descriptors / Indicators: Grimacing, Guarding, Sharp Pain Intervention(s): Monitored during  session, Repositioned, Heat applied    Home Living Family/patient expects to be discharged to:: Private residence Living Arrangements: Alone Available Help at Discharge: Friend(s);Available PRN/intermittently;Family Type of Home: Apartment Home Access: Level entry       Home Layout: One level Home Equipment: Agricultural consultant (2 wheels);Cane - single point      Prior Function            PT Goals (current goals can now be found in the care plan section) Acute Rehab PT Goals Patient Stated Goal: decrease back pain PT Goal Formulation: With patient/family Time For Goal Achievement: 12/07/21 Progress towards PT goals: Progressing toward goals    Frequency    Min 3X/week      PT Plan Current plan remains appropriate       AM-PAC PT "6 Clicks" Mobility   Outcome Measure  Help needed turning from your back to your side while in a flat bed without using bedrails?: None Help needed moving from lying on your back to sitting on the side of a flat bed without using bedrails?: A Little Help needed moving to and from a bed to a chair (including a wheelchair)?: A Lot (anticipated; pt refusing to get up to chair) Help needed standing up from a chair using your arms (e.g., wheelchair or bedside chair)?: A Lot (mod cues for sequencing) Help needed to walk in hospital room?: Total Help needed climbing 3-5 steps with a railing? : Total 6 Click Score: 13    End of Session Equipment Utilized During Treatment: Gait belt Activity Tolerance: Patient limited by pain;Other (comment) (self-limiting behaviors due to pain/fatigue, pt refusing OOB to chair or ambulation) Patient left: in bed;with call bell/phone within reach;with bed alarm set Nurse Communication: Mobility status;Patient requests pain meds PT Visit Diagnosis: Unsteadiness on feet (R26.81);Other abnormalities of gait and mobility (R26.89);Pain Pain - part of body:  (low back pain)     Time: 1610-9604 PT Time Calculation  (min) (ACUTE ONLY): 15 min  Charges:  $Therapeutic Activity: 8-22 mins                     Yang Rack P., PTA Acute Rehabilitation Services Secure Chat Preferred 9a-5:30pm Office: [phone]    Dorathy Kinsman [facility] 11/24/2021, 6:16 PM

## 2021-11-24 NOTE — Evaluation (Signed)
Occupational Therapy Evaluation Patient Details Name: Leah Rose MRN: [id number] DOB: 07/27/1970 Today's Date: 11/24/2021   History of Present Illness Pt is 51 yo female presented with persistent L flank pain on 11/20/21.  Pt found to have MRSA bacteremia/Ecoli UTI with sepsis.  Pt with hx including chronic venous insufficiency, HTN, TIA   Clinical Impression   Pt used a cane outside of her home, but was otherwise independent in ADLs and IADLs. Her chief complaint this visit was dizziness with OOB, BP did not indicate orthostatic hypotension. Transferred only. Min guard assist to transfer to and from [facility]. She had been ambulating to the bathroom with nursing this morning. Pt required set up to min guard assist for ADLs. Recommending HHOT.     Recommendations for follow up therapy are one component of a multi-disciplinary discharge planning process, led by the attending physician.  Recommendations may be updated based on patient status, additional functional criteria and insurance authorization.   Follow Up Recommendations  Home health OT    Assistance Recommended at Discharge Intermittent Supervision/Assistance  Patient can return home with the following A little help with walking and/or transfers;A little help with bathing/dressing/bathroom;Assistance with cooking/housework;Assist for transportation;Help with stairs or ramp for entrance    Functional Status Assessment  Patient has had a recent decline in their functional status and demonstrates the ability to make significant improvements in function in a reasonable and predictable amount of time.  Equipment Recommendations  Tub/shower seat    Recommendations for Other Services       Precautions / Restrictions Precautions Precautions: Fall Restrictions Weight Bearing Restrictions: No      Mobility Bed Mobility Overal bed mobility: Needs Assistance Bed Mobility: Supine to Sit, Sit to Supine     Supine to sit:  Supervision Sit to supine: Supervision        Transfers Overall transfer level: Needs assistance Equipment used: None Transfers: Sit to/from Stand, Bed to chair/wheelchair/BSC Sit to Stand: Min guard Stand pivot transfers: Min guard         General transfer comment: pt reports dizziness with OOB, had been walking to bathroom with nursing this morning      Balance Overall balance assessment: Needs assistance   Sitting balance-Leahy Scale: Good       Standing balance-Leahy Scale: Fair Standing balance comment: statically                           ADL either performed or assessed with clinical judgement   ADL Overall ADL's : Needs assistance/impaired Eating/Feeding: Independent   Grooming: Wash/dry hands;Sitting;Set up   Upper Body Bathing: Set up;Sitting   Lower Body Bathing: Min guard;Sit to/from stand   Upper Body Dressing : Set up;Sitting   Lower Body Dressing: Min guard;Sit to/from stand   Toilet Transfer: Min guard;Stand-pivot;BSC/3in1   Toileting- Architect and Hygiene: Set up;Sitting/lateral lean               Vision Ability to See in Adequate Light: 0 Adequate Patient Visual Report: No change from baseline       Perception     Praxis      Pertinent Vitals/Pain Pain Assessment Pain Assessment: Faces Faces Pain Scale: No hurt     Hand Dominance Right   Extremity/Trunk Assessment Upper Extremity Assessment Upper Extremity Assessment: Overall WFL for tasks assessed   Lower Extremity Assessment Lower Extremity Assessment: Defer to PT evaluation   Cervical / Trunk Assessment Cervical / Trunk Assessment:  Normal   Communication Communication Communication: No difficulties   Cognition Arousal/Alertness: Awake/alert Behavior During Therapy: WFL for tasks assessed/performed Overall Cognitive Status: History of cognitive impairments - at baseline                                 General Comments:  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 (per chart hx of TIA)     General Comments       Exercises     Shoulder Instructions      Home Living Family/patient expects to be discharged to:: Private residence Living Arrangements: Alone Available Help at Discharge: Friend(s);Available PRN/intermittently;Family Type of Home: Apartment Home Access: Level entry     Home Layout: One level     Bathroom Shower/Tub: Chief Strategy Officer: Standard     Home Equipment: Agricultural consultant (2 wheels);Cane - single point          Prior Functioning/Environment Prior Level of Function : Independent/Modified Independent;Driving;Working/employed             Mobility Comments: Used a cane at work and walker at home ADLs Comments: independent with IADLs        OT Problem List: Impaired balance (sitting and/or standing)      OT Treatment/Interventions: Self-care/ADL training;DME and/or AE instruction;Patient/family education;Balance training;Therapeutic activities    OT Goals(Current goals can be found in the care plan section) Acute Rehab OT Goals OT Goal Formulation: With patient Time For Goal Achievement: 12/08/21 Potential to Achieve Goals: Good ADL Goals Pt Will Perform Grooming: with modified independence;standing Pt Will Perform Lower Body Bathing: with modified independence;sit to/from stand Pt Will Perform Lower Body Dressing: with modified independence;sit to/from stand Pt Will Transfer to Toilet: with modified independence;ambulating Pt Will Perform Toileting - Clothing Manipulation and hygiene: with modified independence;sit to/from stand Pt Will Perform Tub/Shower Transfer: with modified independence;rolling walker;ambulating;Tub transfer;shower seat  OT Frequency: Min 2X/week    Co-evaluation              AM-PAC OT "6 Clicks" Daily Activity     Outcome Measure Help from another person eating meals?: None Help from another person taking  care of personal grooming?: A Little Help from another person toileting, which includes using toliet, bedpan, or urinal?: A Little Help from another person bathing (including washing, rinsing, drying)?: A Little Help from another person to put on and taking off regular upper body clothing?: None Help from another person to put on and taking off regular lower body clothing?: A Little 6 Click Score: 20   End of Session Nurse Communication: Other (comment) (ok to give ice pop)  Activity Tolerance: Other (comment) (limited by dizziness) Patient left: in bed;with call bell/phone within reach;with bed alarm set;with family/visitor present  OT Visit Diagnosis: Unsteadiness on feet (R26.81);Other abnormalities of gait and mobility (R26.89);Muscle weakness (generalized) (M62.81);Dizziness and giddiness (R42)                Time: 1450-1506 OT Time Calculation (min): 16 min Charges:  OT General Charges $OT Visit: 1 Visit OT Evaluation $OT Eval Moderate Complexity: 1 Mod  Berna Spare, OTR/L Acute Rehabilitation Services Office: [phone]   Evern Bio 11/24/2021, 3:34 PM

## 2021-11-24 NOTE — Progress Notes (Signed)
Pharmacy Antibiotic Note  Leah Rose is a 51 y.o. female admitted on 11/20/2021 and found to have MRSA bacteremia. Pharmacy has been consulted for Vancomycin dosing.  The Vancomycin peak drawm ~2.5 hours late is 14 mcg/ml which corrects to ~16 mcg/ml based on population kinetics. Will go ahead and adjust the dose empirically as estimates suggest a SUBtherapeutic AUC.   Plan: - Adjust Vancomycin to 750 mg IV every 12 hours - Will plan to recheck repeat levels at steady state  - Will continue to follow renal function, culture results, LOT, and antibiotic de-escalation plans    Height: 4\' 11"  (149.9 cm) Weight: 45.8 kg (101 lb) IBW/kg (Calculated) : 43.2  Temp (24hrs), Avg:98.5 F (36.9 C), Min:97.8 F (36.6 C), Max:99.6 F (37.6 C)  Recent Labs  Lab 11/19/21 0900 11/20/21 1510 11/20/21 1705 11/20/21 1744 11/20/21 2109 11/21/21 0652 11/23/21 0658 11/24/21 0408  WBC 13.9* 9.4  --   --   --  8.7 12.4* 14.6*  CREATININE 0.57  --  0.96  --   --  0.64 0.63 0.60  LATICACIDVEN 1.3  --   --  2.6* 1.7  --   --   --     Estimated Creatinine Clearance: 56.1 mL/min (by C-G formula based on SCr of 0.6 mg/dL).    Allergies  Allergen Reactions   Keflex [Cephalexin] Hives, Itching and Swelling   Penicillins Itching   Sulfonamide Derivatives Itching and Rash    Antimicrobials this admission: Cipro 8/19 >> 8/22 Vancomycin 8/20 >> Cefazolin 8/22 >>  Dose adjustments this admission: N/a  Microbiology results: 8/19 UCx >> E.coli (pan-S except R-Cipro) 8/19 BCx >> 2/4 MRSA 8/20 BCx >> [id number]  Thank you for allowing pharmacy to be a part of this patient's care.  9/20, PharmD, BCPS Infectious Diseases Clinical Pharmacist 11/24/2021 3:12 PM   **Pharmacist phone directory can now be found on [email or web address] ([email or web address]).  Listed under [facility].

## 2021-11-24 NOTE — Progress Notes (Signed)
NAME:  Moyinoluwa Dawe, MRN:  [id number], DOB:  March 11, 1971, LOS: 3 ADMISSION DATE:  11/20/2021  Subjective  Patient evaluated at bedside this AM. Patient says she is doing better than yesterday and is able to move around well. No CP,SOB,N/V, abdominal pain. No BM during admission. Persistent left flank/back pain with improvement relative to prior days.  Objective   Blood pressure 117/89, pulse 95, temperature 98.7 F (37.1 C), temperature source Oral, resp. rate 18, height 4\' 11"  (1.499 m), weight 45.8 kg, SpO2 95 %.     Intake/Output Summary (Last 24 hours) at 11/24/2021 1513 Last data filed at 11/24/2021 0300 Gross per 24 hour  Intake --  Output 500 ml  Net -500 ml    Filed Weights   11/21/21 0901  Weight: 45.8 kg   Physical Exam: General: Laying in bed, pleasant,no distress, non-toxic HEENT: Normocephalic, atraumatic, poor dentition, no discrete abscess or site of infection, question leukoplakia on the right lower gumline CV: Regular rate, rhythm, no m/r/g Pulm: lungs clear to auscultation bilaterally, good airflow bilaterally Abdomen: Soft, non-tender, non-distended. Normoactive bowel sounds. No CVA tenderness. Pure wic in place with dark concentrated urine noted. Skin: Rough, scaling patch on L neck, no open wounds appreciated. Evidence of recently popped boil on the right arm without erythema, fluctuance or discharge. Skin is diffusely hot to the touch. No splinter hemorrhages MSK: Pain to palpation over the left sacroiliac joint. No worsening of pain with abduction and external rotation or adduction and internal rotation of the hip. Negative straight leg test Neuro: Awake, alert, oriented  Labs       Latest Ref Rng & Units 11/24/2021    4:08 AM 11/23/2021    6:58 AM 11/21/2021    6:52 AM  CBC  WBC 4.0 - 10.5 K/uL 14.6  12.4  8.7   Hemoglobin 12.0 - 15.0 g/dL 11/23/2021  32.3  55.7   Hematocrit 36.0 - 46.0 % 38.3  35.8  36.5   Platelets 150 - 400 K/uL 307  277  240        Latest Ref Rng & Units 11/24/2021    4:08 AM 11/23/2021    8:27 PM 11/23/2021    6:58 AM  BMP  Glucose 70 - 99 mg/dL 11/25/2021   025   BUN 6 - 20 mg/dL 6   <5   Creatinine 427 - 1.00 mg/dL 0.62   3.76   Sodium 2.83 - 145 mmol/L 131   132   Potassium 3.5 - 5.1 mmol/L 4.6  3.0  3.1   Chloride 98 - 111 mmol/L 97   94   CO2 22 - 32 mmol/L 24   25   Calcium 8.9 - 10.3 mg/dL 9.0   8.7    Summary   Leianna Barga is 51yo with tobacco use disorder, chronic venous insufficiency, hypertension, TIA admitted 8/19 for cephalosporin allergic reaction and found to have MRSA bacteremia.  Assessment & Plan:  Principal Problem:   Sepsis secondary to UTI (HCC)  MRSA bacteremia E. coli urinary tract infection Repeat blood cultures preliminarily without growth at 2 days. Patient has remained tachycardic but otherwise afebrile, normotensive and reports persistent subjective improvement. WBC increased from 12.4 to 14.6. I am unsure of the discrepancy between the patient on exam and the WBC count as vancomycin appears to be working based on no current growth on repeat cultures. This may reflect secondary seeding of mrsa given pain at sacroiliac joint, but may also just be something that is followed.  Given no clinical signs of endocarditis on heart exam or TTE will likely forgo TEE. Awaiting final cultures with no growth to discharge patient with appropriate antibiotic course to be determined by ID. - PRN tylenol for fever and pain - Follow-up repeat blood cultures - Consider CT A/P with contrast -ID following  E coli urinary tract infection The patient was found to have E coli UTI with no CVA tenderness and no intraabdominal infection on CT A/P at admission. She was initially treated with ciprofloxacin and was switched to cefazolin yesterday per ID. -Continue IV cefazolin -Consider CT A/P for pyelo evaluation  L flank pain Patient says this was an acute onset flank pain without trauma.  Lidocaine patch has still  provided no relief. The patient endorses good response to heat. Some SI joint pain to palpation may reflect sacroiliitis in the setting of mrsa bacteremia. No pain on hip movements to suggest a deep muscle abscess. No radiation down the leg coming from the back to suggest radiculopathy. -Apply heat to SI joint/left flank  Best practice:  DIET: CM IVF: na DVT PPX: lovenox BOWEL: na CODE: FULL FAM COM: na  Evlyn Kanner, MD Internal Medicine Resident PGY-3 PAGER: [phone] 11/24/2021 3:13 PM  If after hours (below), please contact on-call pager: [phone] 5PM-7AM Monday-Friday 1PM-7AM Saturday-Sunday

## 2021-11-25 ENCOUNTER — Encounter (HOSPITAL_COMMUNITY): Payer: Self-pay | Admitting: Internal Medicine

## 2021-11-25 DIAGNOSIS — N390 Urinary tract infection, site not specified: Secondary | ICD-10-CM | POA: Diagnosis not present

## 2021-11-25 DIAGNOSIS — A419 Sepsis, unspecified organism: Secondary | ICD-10-CM | POA: Diagnosis not present

## 2021-11-25 LAB — BASIC METABOLIC PANEL
Anion gap: 12 (ref 5–15)
BUN: 7 mg/dL (ref 6–20)
CO2: 22 mmol/L (ref 22–32)
Calcium: 9.1 mg/dL (ref 8.9–10.3)
Chloride: 96 mmol/L — ABNORMAL LOW (ref 98–111)
Creatinine, Ser: 0.6 mg/dL (ref 0.44–1.00)
GFR, Estimated: >60 mL/min (ref >60)
Glucose, Bld: 110 mg/dL — ABNORMAL HIGH (ref 70–99)
Potassium: 3.8 mmol/L (ref 3.5–5.1)
Sodium: 130 mmol/L — ABNORMAL LOW (ref 135–145)

## 2021-11-25 LAB — CBC
HCT: 36.9 % (ref 36.0–46.0)
Hemoglobin: 12.5 g/dL (ref 12.0–15.0)
MCH: 25.8 pg — ABNORMAL LOW (ref 26.0–34.0)
MCHC: 33.9 g/dL (ref 30.0–36.0)
MCV: 76.1 fL — ABNORMAL LOW (ref 80.0–100.0)
Platelets: 370 10*3/uL (ref 150–400)
RBC: 4.85 MIL/uL (ref 3.87–5.11)
RDW: 13.7 % (ref 11.5–15.5)
WBC: 14 10*3/uL — ABNORMAL HIGH (ref 4.0–10.5)
nRBC: 0 % (ref 0.0–0.2)

## 2021-11-25 LAB — GLUCOSE, CAPILLARY
Glucose-Capillary: 132 mg/dL — ABNORMAL HIGH (ref 70–99)
Glucose-Capillary: 104 mg/dL — ABNORMAL HIGH (ref 70–99)
Glucose-Capillary: 115 mg/dL — ABNORMAL HIGH (ref 70–99)

## 2021-11-25 LAB — VANCOMYCIN, PEAK: Vancomycin Pk: 36 ug/mL (ref 30–40)

## 2021-11-25 MED ORDER — POLYETHYLENE GLYCOL 3350 17 G PO PACK
17.0000 g | PACK | Freq: Every day | ORAL | Status: DC
  Administered 2021-11-25 – 2021-11-29 (×3): 17 g via ORAL
  Filled 2021-11-25 (×4): qty 1

## 2021-11-25 MED ORDER — SENNA 8.6 MG PO TABS
2.0000 | ORAL_TABLET | Freq: Every day | ORAL | Status: DC
  Administered 2021-11-25 – 2021-11-27 (×2): 17.2 mg via ORAL
  Filled 2021-11-25 (×4): qty 2

## 2021-11-25 MED ORDER — SODIUM CHLORIDE 0.9 % IV BOLUS
1000.0000 mL | Freq: Once | INTRAVENOUS | Status: AC
  Administered 2021-11-25: 1000 mL via INTRAVENOUS

## 2021-11-25 NOTE — Progress Notes (Signed)
   [facility] has been requested to perform a transesophageal echocardiogram on Leah Rose for  bacteremia from sepsis/UTI/pyelonephritis in pt with hx of HTN and tobacco use.  After careful review of history and examination, the risks and benefits of transesophageal echocardiogram have been explained including risks of esophageal damage, perforation (1:10,000 risk), bleeding, pharyngeal hematoma as well as other potential complications associated with conscious sedation including aspiration, arrhythmia, respiratory failure and death. Alternatives to treatment were discussed, questions were answered. Patient is willing to proceed. She is somewhat anxious.     Nada Boozer, NP  11/25/2021 3:16 PM

## 2021-11-25 NOTE — H&P (View-Only) (Signed)
[facility] Infectious Diseases Follow Up Note  Patient Identification: Patient Name: Leah Rose MRN: [id number] Admit Date: 11/20/2021 12:19 PM Age: 51 y.o.Today's Date: 11/25/2021  Reason for Visit: MRSA bacteremia and pyelonephritis   Principal Problem:   Sepsis secondary to UTI [facility])  Antibiotics:  Cephalexin 81/8 Ciprofloxacin 81/9-8/20 Vancomycin 8/20-   Lines/Hardware: PIV   Interval Events: remains afebrile, WBC up to 14, CT abd/pelvis unremarkable    Assessment 51 year old female with PMH of HTN, depression, chronic venous insufficiency who presented to the ED with persistent left flank pain, allergic reaction to cephalexin. Admitted for    # Clinical left sided pyelonephritis ( fever and left sided back/flankpain) 8/18 Urine cx E coli, R to cipro and levofloxacin  8/19 Urine E coli , R to cipro   # MRSA bacteremia - she has a ruptured boil in her rt arm and likely the cause for MRSA bacteremia. She complains of left lower back pain. No spinal tenderness, no signs of septic peripheral joints or no known hardwares TTE 8/21 unremarkable for vegetations or endocarditis   # Left lower back pain - mild tenderness, she does not have midline back pain or spinal tenderness. CT abdomen/pelvis 823 was unremarkable   # Allergy to Penicillin/Cephalexin/Sulpha - no issues with IV trial of IV cefazolin and currently on IV cefazolin for tx of pyelonephritis without any complaints   Recommendations Continue Vancomycin, pharmacy to dose for MRSA bacteremia and IV cefazolin for pyelonephritis  Fu repeat blood cx for clearance TEE planned for tomorrow to r/o endocarditis Analgesics for left lower back pain per PPM Monitor CBC for leukocytosis  D/w primary   Rest of the management as per the primary team. Thank you for the consult. Please page with pertinent questions or  concerns.  ______________________________________________________________________ Subjective patient seen and examined at the bedside.  Left lower back pain is better when getting analgesics and comes rt back when wears off.  No mid line back pain, peripheral joint pain   Vitals BP 97/71 (BP Location: Left Arm)   Pulse 81   Temp 98.9 F (37.2 C) (Oral)   Resp 16   Ht 4\' 11"  (1.499 m)   Wt 45.8 kg   SpO2 96%   BMI 20.40 kg/m     Physical Exam Constitutional: lying in the bed, appears comfortable     Comments:   Cardiovascular:     Rate and Rhythm: Normal rate and regular rhythm.     Heart sounds:  Pulmonary:     Effort: Pulmonary effort is normal on room air     Comments:   Abdominal:     Palpations: Abdomen is soft.     Tenderness: non distended   Musculoskeletal:        General: No swelling or tenderness. No midline spinal tenderness, mild tenderness at left lower back, no overlying cellulitis, fluctuance or crepitus   Skin:    Comments: Rt arm ruptured boil   Neurological:     General: grossly non focal, awake, alert and oriented   Psychiatric:        Mood and Affect: Mood normal.   Pertinent Microbiology Results for orders placed or performed during the hospital encounter of 11/20/21  Susceptibility, Aer + Anaerob     Status: Abnormal   Collection Time: 11/19/21 11:16 AM  Result Value Ref Range Status   Suscept, Aer + Anaerob Final report (A)  Corrected    Comment: (NOTE) Performed At: [facility] [address] [place], [place]  [id number] Jolene Schimke MD XO:[id number] CORRECTED ON 08/23 AT 2135: PREVIOUSLY REPORTED AS Preliminary report    Source of Sample E. COLI/ URINE  Final    Comment: Performed at [facility], [address], [place], [place] [address]  Susceptibility Result     Status: Abnormal   Collection Time: 11/19/21 11:16 AM  Result Value Ref Range Status   Suscept Result 1 Escherichia coli (A)  Final    Comment:  (NOTE) Identification performed by account, not confirmed by this laboratory. Testing performed by broth microdilution.    Antimicrobial Suscept Comment  Corrected    Comment: (NOTE)      ** S = Susceptible; I = Intermediate; R = Resistant **                   P = Positive; N = Negative            MICS are expressed in micrograms per mL   Antibiotic                 RSLT#1    RSLT#2    RSLT#3    RSLT#4 Amoxicillin/Clavulanic Acid    S Ampicillin                     S Ceftriaxone                    S Cefuroxime                     I Ciprofloxacin                  R Ertapenem                      S Gentamicin                     S Imipenem                       S Levofloxacin                   R Meropenem                      S Nitrofurantoin                 S Tetracycline                   S Tobramycin                     S Trimethoprim/Sulfa             S Performed At: [facility] [employer] [address] [place], [place] [id number] Jolene Schimke MD [id number]   Resp Panel by RT-PCR (Flu A&B, Covid) Anterior Nasal Swab     Status: None   Collection Time: 11/20/21  4:18 PM   Specimen: Anterior Nasal Swab  Result Value Ref Range Status   SARS Coronavirus 2 by RT PCR NEGATIVE NEGATIVE Final    Comment: (NOTE) SARS-CoV-2 target nucleic acids are NOT DETECTED.  The SARS-CoV-2 RNA is generally detectable in upper respiratory specimens during the acute phase of infection. The lowest concentration of SARS-CoV-2 viral copies this assay can detect is 138 copies/mL. A negative result does not preclude SARS-Cov-2 infection and should not be used as the sole basis for treatment  or other patient management decisions. A negative result may occur with  improper specimen collection/handling, submission of specimen other than nasopharyngeal swab, presence of viral mutation(s) within the areas targeted by this assay, and inadequate number of viral copies(<138 copies/mL). A negative  result must be combined with clinical observations, patient history, and epidemiological information. The expected result is Negative.  Fact Sheet for Patients:  BloggerCourse.com  Fact Sheet for Healthcare Providers:  SeriousBroker.it  This test is no t yet approved or cleared by the [place] FDA and  has been authorized for detection and/or diagnosis of SARS-CoV-2 by FDA under an Emergency Use Authorization (EUA). This EUA will remain  in effect (meaning this test can be used) for the duration of the COVID-19 declaration under Section 564(b)(1) of the Act, 21 U.S.C.section 360bbb-3(b)(1), unless the authorization is terminated  or revoked sooner.       Influenza A by PCR NEGATIVE NEGATIVE Final   Influenza B by PCR NEGATIVE NEGATIVE Final    Comment: (NOTE) The Xpert Xpress SARS-CoV-2/FLU/RSV plus assay is intended as an aid in the diagnosis of influenza from Nasopharyngeal swab specimens and should not be used as a sole basis for treatment. Nasal washings and aspirates are unacceptable for Xpert Xpress SARS-CoV-2/FLU/RSV testing.  Fact Sheet for Patients: BloggerCourse.com  Fact Sheet for Healthcare Providers: SeriousBroker.it  This test is not yet approved or cleared by the [place] FDA and has been authorized for detection and/or diagnosis of SARS-CoV-2 by FDA under an Emergency Use Authorization (EUA). This EUA will remain in effect (meaning this test can be used) for the duration of the COVID-19 declaration under Section 564(b)(1) of the Act, 21 U.S.C. section 360bbb-3(b)(1), unless the authorization is terminated or revoked.  Performed at [facility], [address] [place], [place] [address]   Blood Culture (routine x 2)     Status: Abnormal   Collection Time: 11/20/21  4:18 PM   Specimen: BLOOD RIGHT FOREARM  Result Value Ref Range Status    Specimen Description BLOOD RIGHT FOREARM  Final   Special Requests   Final    BOTTLES DRAWN AEROBIC AND ANAEROBIC Blood Culture adequate volume   Culture  Setup Time   Final    GRAM POSITIVE COCCI IN BOTH AEROBIC AND ANAEROBIC BOTTLES CRITICAL RESULT CALLED TO, READ BACK BY AND VERIFIED WITH: PHARMD C PIERCE [id number] AT 752 AM BY CM Performed at [facility], [address], [place], [place] [address]    Culture METHICILLIN RESISTANT STAPHYLOCOCCUS AUREUS (A)  Final   Report Status 11/23/2021 FINAL  Final   Organism ID, Bacteria METHICILLIN RESISTANT STAPHYLOCOCCUS AUREUS  Final      Susceptibility   Methicillin resistant staphylococcus aureus - MIC*    CIPROFLOXACIN <=0.5 SENSITIVE Sensitive     ERYTHROMYCIN <=0.25 SENSITIVE Sensitive     GENTAMICIN <=0.5 SENSITIVE Sensitive     OXACILLIN >=4 RESISTANT Resistant     TETRACYCLINE <=1 SENSITIVE Sensitive     VANCOMYCIN 1 SENSITIVE Sensitive     TRIMETH/SULFA <=10 SENSITIVE Sensitive     CLINDAMYCIN <=0.25 SENSITIVE Sensitive     RIFAMPIN <=0.5 SENSITIVE Sensitive     Inducible Clindamycin NEGATIVE Sensitive     * METHICILLIN RESISTANT STAPHYLOCOCCUS AUREUS  Urine Culture     Status: Abnormal   Collection Time: 11/20/21  4:18 PM   Specimen: In/Out Cath Urine  Result Value Ref Range Status   Specimen Description IN/OUT CATH URINE  Final   Special Requests  Final    NONE Performed at [facility], [address] [place], [place] [address]    Culture >=100,000 COLONIES/mL ESCHERICHIA COLI (A)  Final   Report Status 11/23/2021 FINAL  Final   Organism ID, Bacteria ESCHERICHIA COLI (A)  Final      Susceptibility   Escherichia coli - MIC*    AMPICILLIN 8 SENSITIVE Sensitive     CEFAZOLIN <=4 SENSITIVE Sensitive     CEFTRIAXONE <=0.25 SENSITIVE Sensitive     CIPROFLOXACIN >=4 RESISTANT Resistant     GENTAMICIN <=1 SENSITIVE Sensitive     IMIPENEM <=0.25 SENSITIVE Sensitive     NITROFURANTOIN <=16 SENSITIVE Sensitive      TRIMETH/SULFA <=20 SENSITIVE Sensitive     AMPICILLIN/SULBACTAM <=2 SENSITIVE Sensitive     * >=100,000 COLONIES/mL ESCHERICHIA COLI  Blood Culture ID Panel (Reflexed)     Status: Abnormal   Collection Time: 11/20/21  4:18 PM  Result Value Ref Range Status   Enterococcus faecalis NOT DETECTED NOT DETECTED Final   Enterococcus Faecium NOT DETECTED NOT DETECTED Final   Listeria monocytogenes NOT DETECTED NOT DETECTED Final   Staphylococcus species DETECTED (A) NOT DETECTED Final    Comment: CRITICAL RESULT CALLED TO, READ BACK BY AND VERIFIED WITH: PHARMD C PIERCE [id number] AT 751 AM BY CM    Staphylococcus aureus (BCID) DETECTED (A) NOT DETECTED Final    Comment: Methicillin (oxacillin)-resistant Staphylococcus aureus (MRSA). MRSA is predictably resistant to beta-lactam antibiotics (except ceftaroline). Preferred therapy is vancomycin unless clinically contraindicated. Patient requires contact precautions if  hospitalized. CRITICAL RESULT CALLED TO, READ BACK BY AND VERIFIED WITH: PHARMD C PIERCE [id number] AT 751 AM BY CM    Staphylococcus epidermidis NOT DETECTED NOT DETECTED Final   Staphylococcus lugdunensis NOT DETECTED NOT DETECTED Final   Streptococcus species NOT DETECTED NOT DETECTED Final   Streptococcus agalactiae NOT DETECTED NOT DETECTED Final   Streptococcus pneumoniae NOT DETECTED NOT DETECTED Final   Streptococcus pyogenes NOT DETECTED NOT DETECTED Final   A.calcoaceticus-baumannii NOT DETECTED NOT DETECTED Final   Bacteroides fragilis NOT DETECTED NOT DETECTED Final   Enterobacterales NOT DETECTED NOT DETECTED Final   Enterobacter cloacae complex NOT DETECTED NOT DETECTED Final   Escherichia coli NOT DETECTED NOT DETECTED Final   Klebsiella aerogenes NOT DETECTED NOT DETECTED Final   Klebsiella oxytoca NOT DETECTED NOT DETECTED Final   Klebsiella pneumoniae NOT DETECTED NOT DETECTED Final   Proteus species NOT DETECTED NOT DETECTED Final   Salmonella species NOT DETECTED  NOT DETECTED Final   Serratia marcescens NOT DETECTED NOT DETECTED Final   Haemophilus influenzae NOT DETECTED NOT DETECTED Final   Neisseria meningitidis NOT DETECTED NOT DETECTED Final   Pseudomonas aeruginosa NOT DETECTED NOT DETECTED Final   Stenotrophomonas maltophilia NOT DETECTED NOT DETECTED Final   Candida albicans NOT DETECTED NOT DETECTED Final   Candida auris NOT DETECTED NOT DETECTED Final   Candida glabrata NOT DETECTED NOT DETECTED Final   Candida krusei NOT DETECTED NOT DETECTED Final   Candida parapsilosis NOT DETECTED NOT DETECTED Final   Candida tropicalis NOT DETECTED NOT DETECTED Final   Cryptococcus neoformans/gattii NOT DETECTED NOT DETECTED Final   Meth resistant mecA/C and MREJ DETECTED (A) NOT DETECTED Final    Comment: CRITICAL RESULT CALLED TO, READ BACK BY AND VERIFIED WITH: PHARMD C PIERCE [id number] AT 751 AM BY CM Performed at [facility], [address] [place], [place] [address]   Blood Culture (routine x 2)     Status:  None (Preliminary result)   Collection Time: 11/20/21  4:23 PM   Specimen: BLOOD RIGHT FOREARM  Result Value Ref Range Status   Specimen Description BLOOD RIGHT FOREARM  Final   Special Requests   Final    BOTTLES DRAWN AEROBIC ONLY Blood Culture adequate volume   Culture   Final    NO GROWTH 3 DAYS Performed at [facility], [address] [place], [place] [address]    Report Status PENDING  Incomplete  Culture, blood (Routine X 2) w Reflex to ID Panel     Status: None (Preliminary result)   Collection Time: 11/21/21  8:21 AM   Specimen: BLOOD  Result Value Ref Range Status   Specimen Description BLOOD SITE NOT SPECIFIED  Final   Special Requests   Final    BOTTLES DRAWN AEROBIC AND ANAEROBIC Blood Culture results may not be optimal due to an inadequate volume of blood received in culture bottles   Culture   Final    NO GROWTH 3 DAYS Performed at [facility], [address] [place], [place] [address]     Report Status PENDING  Incomplete  MRSA Next Gen by PCR, Nasal     Status: None   Collection Time: 11/21/21  1:46 PM   Specimen: Nasal Mucosa; Nasal Swab  Result Value Ref Range Status   MRSA by PCR Next Gen NOT DETECTED NOT DETECTED Final    Comment: (NOTE) The GeneXpert MRSA Assay (FDA approved for NASAL specimens only), is one component of a comprehensive MRSA colonization surveillance program. It is not intended to diagnose MRSA infection nor to guide or monitor treatment for MRSA infections. Test performance is not FDA approved in patients less than 52 years old. Performed at [facility], [address] [place], [place] [address]     Pertinent Lab.    Latest Ref Rng & Units 11/25/2021    3:52 AM 11/24/2021    4:08 AM 11/23/2021    6:58 AM  CBC  WBC 4.0 - 10.5 K/uL 14.0  14.6  12.4   Hemoglobin 12.0 - 15.0 g/dL 13.012.5  86.512.9  78.412.3   Hematocrit 36.0 - 46.0 % 36.9  38.3  35.8   Platelets 150 - 400 K/uL 370  307  277       Latest Ref Rng & Units 11/25/2021    3:52 AM 11/24/2021    4:08 AM 11/23/2021    8:27 PM  CMP  Glucose 70 - 99 mg/dL 696110  [id number]    BUN 6 - 20 mg/dL 7  6    Creatinine 2.840.44 - 1.00 mg/dL 1.320.60  4.400.60    Sodium 102135 - 145 mmol/L 130  131    Potassium 3.5 - 5.1 mmol/L 3.8  4.6  3.0   Chloride 98 - 111 mmol/L 96  97    CO2 22 - 32 mmol/L 22  24    Calcium 8.9 - 10.3 mg/dL 9.1  9.0       Pertinent Imaging today Plain films and CT images have been personally visualized and interpreted; radiology reports have been reviewed. Decision making incorporated into the Impression / Recommendations.  CT ABDOMEN PELVIS W CONTRAST  Result Date: 11/24/2021 CLINICAL DATA:  Sepsis EXAM: CT ABDOMEN AND PELVIS WITH CONTRAST TECHNIQUE: Multidetector CT imaging of the abdomen and pelvis was performed using the standard protocol following bolus administration of intravenous contrast. RADIATION DOSE REDUCTION: This exam was performed according to the departmental  dose-optimization  program which includes automated exposure control, adjustment of the mA and/or kV according to patient size and/or use of iterative reconstruction technique. CONTRAST:  80mL OMNIPAQUE IOHEXOL 300 MG/ML  SOLN COMPARISON:  11/19/2021 FINDINGS: Lower chest: Trace bilateral effusions and dependent bibasilar atelectasis. Hepatobiliary: No focal hepatic abnormality. Gallbladder unremarkable. Pancreas: No focal abnormality or ductal dilatation. Spleen: No focal abnormality.  Normal size. Adrenals/Urinary Tract: No adrenal abnormality. No focal renal abnormality. No stones or hydronephrosis. Urinary bladder is unremarkable. Stomach/Bowel: Stomach, large and small bowel grossly unremarkable. Normal appendix. Vascular/Lymphatic: No evidence of aneurysm or adenopathy. Reproductive: Uterus and adnexa unremarkable.  No mass. Other: Trace free fluid in the pelvis.  No free air. Musculoskeletal: No acute bony abnormality. IMPRESSION: No acute findings in the abdomen or pelvis. Trace bilateral pleural effusions.  Dependent bibasilar atelectasis. Electronically Signed   By: Charlett Nose M.D.   On: 11/24/2021 23:22    I spent 50 minutes for this patient encounter including review of prior medical records, coordination of care with primary/other specialist with greater than 50% of time being face to face/counseling and discussing diagnostics/treatment plan with the patient/family.  Electronically signed by:   Odette Fraction, MD Infectious Disease Physician [facility] Pager: [phone]

## 2021-11-25 NOTE — Progress Notes (Signed)
NAME:  Leah Rose, MRN:  [id number], DOB:  04-17-1970, LOS: 4 ADMISSION DATE:  11/20/2021  Subjective  Patient evaluated at bedside this AM. Patient continues to feel better and denies sweating, fever, chills, CP,SOB,N/V, abdominal pain. No BM since Monday.  Objective   Blood pressure 117/89, pulse 87, temperature 98.5 F (36.9 C), temperature source Oral, resp. rate 16, height 4\' 11"  (1.499 m), weight 45.8 kg, SpO2 97 %.    No intake or output data in the 24 hours ending 11/25/21 1343  Filed Weights   11/21/21 0901  Weight: 45.8 kg   Physical Exam: Grossly unchanged since yesterday General: Laying in bed, pleasant,no distress, non-toxic HEENT: Normocephalic, atraumatic, no scleral icterus CV: Regular rate, rhythm, no m/r/g Pulm: lungs clear to auscultation bilaterally, good airflow bilaterally Abdomen: Normoactive bowel sounds. Soft, non-tender, non-distended, no palpable masses. No CVA tenderness.  Skin: Rough, scaling patch on L neck, no open wounds appreciated. Evidence of recently popped boil on the right arm without erythema, fluctuance or discharge. Skin is diffusely hot to the touch. No splinter hemorrhages MSK: Pain to palpation over the left sacroiliac joint. No midline back pain Neuro: Awake, alert, oriented  Labs       Latest Ref Rng & Units 11/25/2021    3:52 AM 11/24/2021    4:08 AM 11/23/2021    6:58 AM  CBC  WBC 4.0 - 10.5 K/uL 14.0  14.6  12.4   Hemoglobin 12.0 - 15.0 g/dL 11/25/2021  01.7  51.0   Hematocrit 36.0 - 46.0 % 36.9  38.3  35.8   Platelets 150 - 400 K/uL 370  307  277       Latest Ref Rng & Units 11/25/2021    3:52 AM 11/24/2021    4:08 AM 11/23/2021    8:27 PM  BMP  Glucose 70 - 99 mg/dL 11/25/2021  527    BUN 6 - 20 mg/dL 7  6    Creatinine 782 - 1.00 mg/dL 4.23  5.36    Sodium 1.44 - 145 mmol/L 130  131    Potassium 3.5 - 5.1 mmol/L 3.8  4.6  3.0   Chloride 98 - 111 mmol/L 96  97    CO2 22 - 32 mmol/L 22  24    Calcium 8.9 - 10.3 mg/dL 9.1  9.0      Summary   Leah Rose is 51yo with tobacco use disorder, chronic venous insufficiency, hypertension, TIA admitted 8/19 for cephalosporin allergic reaction and found to have MRSA bacteremia.  Assessment & Plan:  Principal Problem:   Sepsis secondary to UTI (HCC)  MRSA bacteremia E. coli urinary tract infection Vital signs are stable with improving wbc 14.6 to 14. CT A/P w contrast yesterday negative for abdominal or pelvic infection. Repeat blood cultures preliminarily without growth at 3 days. Awaiting final cultures with no growth and TEE to discharge patient with appropriate antibiotic course to be determined by ID, but likely short course pending negative TEE as that would make this uncomplicated MRSA bacteremia. - PRN tylenol for fever and pain - Follow-up repeat blood cultures - TEE -ID following  E coli urinary tract infection The patient was found to have E coli UTI with no CVA tenderness and no intraabdominal infection on CT A/P non-contrast at admission. Repeat CT AP w contrast 8/23 without evidence of pyelo. She was initially treated with ciprofloxacin and was switched to cefazolin 8/22 per ID and will continue per their recommendation. -Continue IV cefazolin  L flank pain Patient says this was an acute onset flank pain without trauma. No radiation down the leg coming from the back to suggest radiculopathy. CT A/P w contrast 8/23 without an identifyable cause on imaging.Lidocaine patch has still provided no relief. The patient endorses good response to heat. Given her tight hamstrings on that side am curious if this is muscle spasm. She has not had a BM since Monday which may be causing this internal L flank pain as well.  -Senna 2 tablets daily -Mirilax 17g daily -Apply heat to SI joint/left flank  Best practice:  DIET: CM IVF: na DVT PPX: lovenox BOWEL: na CODE: FULL FAM COM: na  Evlyn Kanner, MD Internal Medicine Resident PGY-3 PAGER:  [phone] 11/25/2021 1:43 PM  If after hours (below), please contact on-call pager: [phone] 5PM-7AM Monday-Friday 1PM-7AM Saturday-Sunday

## 2021-11-25 NOTE — Progress Notes (Signed)
Physical Therapy Treatment Patient Details Name: Leah Rose MRN: [id number] DOB: 31-May-1970 Today's Date: 11/25/2021   History of Present Illness Pt is 51 yo female presented with persistent L flank pain on 11/20/21.  Pt found to have MRSA bacteremia/Ecoli UTI with sepsis.  Pt with hx including chronic venous insufficiency, HTN, TIA    PT Comments    Pt reports feeling much better today vs previous days, still reporting some L flank pain. Pt ambulatory around unit with use of RW and supervision to min guard for mobility at this time. PT to continue to follow to continue progressing pt.      Recommendations for follow up therapy are one component of a multi-disciplinary discharge planning process, led by the attending physician.  Recommendations may be updated based on patient status, additional functional criteria and insurance authorization.  Follow Up Recommendations  Home health PT     Assistance Recommended at Discharge Frequent or constant Supervision/Assistance  Patient can return home with the following Help with stairs or ramp for entrance;A little help with walking and/or transfers;Assistance with cooking/housework   Equipment Recommendations  None recommended by PT    Recommendations for Other Services       Precautions / Restrictions Precautions Precautions: Fall Restrictions Weight Bearing Restrictions: No     Mobility  Bed Mobility Overal bed mobility: Needs Assistance Bed Mobility: Supine to Sit, Sit to Supine Rolling: Min assist Sidelying to sit: Min guard       General bed mobility comments: assist for supine>sit for trunk elevation, scooting to EOB. no assist for return to supine    Transfers Overall transfer level: Needs assistance Equipment used: Rolling walker (2 wheels) Transfers: Sit to/from Stand Sit to Stand: Supervision           General transfer comment: for safety, pt using both hands on RW to pull to stand initially but on second  stand from toilet pt using grab bar    Ambulation/Gait Ambulation/Gait assistance: Supervision Gait Distance (Feet): 225 Feet Assistive device: Rolling walker (2 wheels) Gait Pattern/deviations: Step-through pattern, Decreased stride length, Trunk flexed, Shuffle Gait velocity: decr     General Gait Details: cues for upright posture, slowed gait with short steps. Pt with no reports of dizziness during mobility   Stairs             Wheelchair Mobility    Modified Rankin (Stroke Patients Only)       Balance Overall balance assessment: Needs assistance Sitting-balance support: Bilateral upper extremity supported Sitting balance-Leahy Scale: Fair     Standing balance support: Reliant on assistive device for balance Standing balance-Leahy Scale: Poor                              Cognition Arousal/Alertness: Awake/alert Behavior During Therapy: WFL for tasks assessed/performed Overall Cognitive Status: History of cognitive impairments - at baseline                                 General Comments: 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        Exercises      General Comments        Pertinent Vitals/Pain Pain Assessment Pain Assessment: Faces Faces Pain Scale: Hurts even more Pain Location: low back pain with movement Pain Descriptors / Indicators: Grimacing, Guarding, Sharp Pain Intervention(s): Limited activity within patient's tolerance, Monitored  during session, Repositioned    Home Living                          Prior Function            PT Goals (current goals can now be found in the care plan section) Acute Rehab PT Goals Patient Stated Goal: decrease back pain PT Goal Formulation: With patient Time For Goal Achievement: 12/07/21 Potential to Achieve Goals: Good Progress towards PT goals: Progressing toward goals    Frequency    Min 3X/week      PT Plan Current plan remains appropriate     Co-evaluation              AM-PAC PT "6 Clicks" Mobility   Outcome Measure  Help needed turning from your back to your side while in a flat bed without using bedrails?: None Help needed moving from lying on your back to sitting on the side of a flat bed without using bedrails?: A Little Help needed moving to and from a bed to a chair (including a wheelchair)?: A Little Help needed standing up from a chair using your arms (e.g., wheelchair or bedside chair)?: A Little Help needed to walk in hospital room?: A Little Help needed climbing 3-5 steps with a railing? : A Little 6 Click Score: 19    End of Session   Activity Tolerance: Patient tolerated treatment well Patient left: in bed;with call bell/phone within reach;with bed alarm set Nurse Communication: Mobility status PT Visit Diagnosis: Unsteadiness on feet (R26.81);Other abnormalities of gait and mobility (R26.89);Pain Pain - part of body:  (low back pain)     Time: 4315-4008 PT Time Calculation (min) (ACUTE ONLY): 24 min  Charges:  $Gait Training: 8-22 mins $Therapeutic Activity: 8-22 mins                     Marye Round, PT DPT Acute Rehabilitation Services Pager [phone]  Office [phone]    Eland Lamantia E Christain [place] 11/25/2021, 11:06 AM

## 2021-11-25 NOTE — Progress Notes (Addendum)
[facility] Infectious Diseases Follow Up Note  Patient Identification: Patient Name: Leah Rose MRN: [id number] Admit Date: 11/20/2021 12:19 PM Age: 51 y.o.Today's Date: 11/25/2021  Reason for Visit: MRSA bacteremia and pyelonephritis   Principal Problem:   Sepsis secondary to UTI [facility])  Antibiotics:  Cephalexin 81/8 Ciprofloxacin 81/9-8/22, Cefazolin 8/24-c Vancomycin 8/20-   Lines/Hardware: PIV   Interval Events: remains afebrile, WBC up to 14, CT abd/pelvis unremarkable    Assessment 51 year old female with PMH of HTN, depression, chronic venous insufficiency who presented to the ED with persistent left flank pain, allergic reaction to cephalexin. Admitted for    # Clinical left sided pyelonephritis ( fever and left sided back/flankpain) 8/18 Urine cx E coli, R to cipro and levofloxacin  8/19 Urine E coli , R to cipro   # MRSA bacteremia - she has a ruptured boil in her rt arm and likely the cause for MRSA bacteremia. She complains of left lower back pain. No spinal tenderness, no signs of septic peripheral joints or no known hardwares TTE 8/21 unremarkable for vegetations or endocarditis   # Left lower back pain - mild tenderness, she does not have midline back pain or spinal tenderness. CT abdomen/pelvis 823 was unremarkable   # Allergy to Penicillin/Cephalexin/Sulpha - no issues with IV trial of IV cefazolin and currently on IV cefazolin for tx of pyelonephritis without any complaints   Recommendations Continue Vancomycin, pharmacy to dose for MRSA bacteremia and IV cefazolin for pyelonephritis  Fu repeat blood cx for clearance TEE planned for tomorrow to r/o endocarditis Analgesics for left lower back pain per PPM Monitor CBC for leukocytosis  D/w primary   Rest of the management as per the primary team. Thank you for the consult. Please page with pertinent questions or  concerns.  ______________________________________________________________________ Subjective patient seen and examined at the bedside.  Left lower back pain is better when getting analgesics and comes rt back when wears off.  No mid line back pain, peripheral joint pain   Vitals BP 97/71 (BP Location: Left Arm)   Pulse 81   Temp 98.9 F (37.2 C) (Oral)   Resp 16   Ht 4\' 11"  (1.499 m)   Wt 45.8 kg   SpO2 96%   BMI 20.40 kg/m     Physical Exam Constitutional: lying in the bed, appears comfortable     Comments:   Cardiovascular:     Rate and Rhythm: Normal rate and regular rhythm.     Heart sounds:  Pulmonary:     Effort: Pulmonary effort is normal on room air     Comments:   Abdominal:     Palpations: Abdomen is soft.     Tenderness: non distended   Musculoskeletal:        General: No swelling or tenderness. No midline spinal tenderness, mild tenderness at left lower back, no overlying cellulitis, fluctuance or crepitus   Skin:    Comments: Rt arm ruptured boil   Neurological:     General: grossly non focal, awake, alert and oriented   Psychiatric:        Mood and Affect: Mood normal.   Pertinent Microbiology Results for orders placed or performed during the hospital encounter of 11/20/21  Susceptibility, Aer + Anaerob     Status: Abnormal   Collection Time: 11/19/21 11:16 AM  Result Value Ref Range Status   Suscept, Aer + Anaerob Final report (A)  Corrected    Comment: (NOTE) Performed At: [facility] [address]  [place], [place] [id number] Rush Farmer MD RW:[id number] CORRECTED ON 08/23 AT 2135: PREVIOUSLY REPORTED AS Preliminary report    Source of Sample E. COLI/ URINE  Final    Comment: Performed at [facility], [place] [address] [place], [place] [address]  Susceptibility Result     Status: Abnormal   Collection Time: 11/19/21 11:16 AM  Result Value Ref Range Status   Suscept Result 1 Escherichia coli (A)  Final    Comment:  (NOTE) Identification performed by account, not confirmed by this laboratory. Testing performed by broth microdilution.    Antimicrobial Suscept Comment  Corrected    Comment: (NOTE)      ** S = Susceptible; I = Intermediate; R = Resistant **                   P = Positive; N = Negative            MICS are expressed in micrograms per mL   Antibiotic                 RSLT#1    RSLT#2    RSLT#3    RSLT#4 Amoxicillin/Clavulanic Acid    S Ampicillin                     S Ceftriaxone                    S Cefuroxime                     I Ciprofloxacin                  R Ertapenem                      S Gentamicin                     S Imipenem                       S Levofloxacin                   R Meropenem                      S Nitrofurantoin                 S Tetracycline                   S Tobramycin                     S Trimethoprim/Sulfa             S Performed At: [facility] [employer] [place], [place] [id number] Rush Farmer MD [id number]   Resp Panel by RT-PCR (Flu A&B, Covid) Anterior Nasal Swab     Status: None   Collection Time: 11/20/21  4:18 PM   Specimen: Anterior Nasal Swab  Result Value Ref Range Status   SARS Coronavirus 2 by RT PCR NEGATIVE NEGATIVE Final    Comment: (NOTE) SARS-CoV-2 target nucleic acids are NOT DETECTED.  The SARS-CoV-2 RNA is generally detectable in upper respiratory specimens during the acute phase of infection. The lowest concentration of SARS-CoV-2 viral copies this assay can detect is 138 copies/mL. A negative result does not preclude SARS-Cov-2 infection and should not be used as the sole basis  for treatment or other patient management decisions. A negative result may occur with  improper specimen collection/handling, submission of specimen other than nasopharyngeal swab, presence of viral mutation(s) within the areas targeted by this assay, and inadequate number of viral copies(<138 copies/mL). A negative  result must be combined with clinical observations, patient history, and epidemiological information. The expected result is Negative.  Fact Sheet for Patients:  BloggerCourse.comhttps://www.fda.gov/media/152166/download  Fact Sheet for Healthcare Providers:  SeriousBroker.ithttps://www.fda.gov/media/152162/download  This test is no t yet approved or cleared by the [place] FDA and  has been authorized for detection and/or diagnosis of SARS-CoV-2 by FDA under an Emergency Use Authorization (EUA). This EUA will remain  in effect (meaning this test can be used) for the duration of the COVID-19 declaration under Section 564(b)(1) of the Act, 21 U.S.C.section 360bbb-3(b)(1), unless the authorization is terminated  or revoked sooner.       Influenza A by PCR NEGATIVE NEGATIVE Final   Influenza B by PCR NEGATIVE NEGATIVE Final    Comment: (NOTE) The Xpert Xpress SARS-CoV-2/FLU/RSV plus assay is intended as an aid in the diagnosis of influenza from Nasopharyngeal swab specimens and should not be used as a sole basis for treatment. Nasal washings and aspirates are unacceptable for Xpert Xpress SARS-CoV-2/FLU/RSV testing.  Fact Sheet for Patients: BloggerCourse.comhttps://www.fda.gov/media/152166/download  Fact Sheet for Healthcare Providers: SeriousBroker.ithttps://www.fda.gov/media/152162/download  This test is not yet approved or cleared by the [place] FDA and has been authorized for detection and/or diagnosis of SARS-CoV-2 by FDA under an Emergency Use Authorization (EUA). This EUA will remain in effect (meaning this test can be used) for the duration of the COVID-19 declaration under Section 564(b)(1) of the Act, 21 U.S.C. section 360bbb-3(b)(1), unless the authorization is terminated or revoked.  Performed at [facility], [address] [place], [place] [address]   Blood Culture (routine x 2)     Status: Abnormal   Collection Time: 11/20/21  4:18 PM   Specimen: BLOOD RIGHT FOREARM  Result Value Ref Range Status    Specimen Description BLOOD RIGHT FOREARM  Final   Special Requests   Final    BOTTLES DRAWN AEROBIC AND ANAEROBIC Blood Culture adequate volume   Culture  Setup Time   Final    GRAM POSITIVE COCCI IN BOTH AEROBIC AND ANAEROBIC BOTTLES CRITICAL RESULT CALLED TO, READ BACK BY AND VERIFIED WITH: PHARMD C PIERCE [phone] AT 752 AM BY CM Performed at [facility], [address] [place], [place] [address]    Culture METHICILLIN RESISTANT STAPHYLOCOCCUS AUREUS (A)  Final   Report Status 11/23/2021 FINAL  Final   Organism ID, Bacteria METHICILLIN RESISTANT STAPHYLOCOCCUS AUREUS  Final      Susceptibility   Methicillin resistant staphylococcus aureus - MIC*    CIPROFLOXACIN <=0.5 SENSITIVE Sensitive     ERYTHROMYCIN <=0.25 SENSITIVE Sensitive     GENTAMICIN <=0.5 SENSITIVE Sensitive     OXACILLIN >=4 RESISTANT Resistant     TETRACYCLINE <=1 SENSITIVE Sensitive     VANCOMYCIN 1 SENSITIVE Sensitive     TRIMETH/SULFA <=10 SENSITIVE Sensitive     CLINDAMYCIN <=0.25 SENSITIVE Sensitive     RIFAMPIN <=0.5 SENSITIVE Sensitive     Inducible Clindamycin NEGATIVE Sensitive     * METHICILLIN RESISTANT STAPHYLOCOCCUS AUREUS  Urine Culture     Status: Abnormal   Collection Time: 11/20/21  4:18 PM   Specimen: In/Out Cath Urine  Result Value Ref Range Status   Specimen Description IN/OUT CATH URINE  Final  Special Requests   Final    NONE Performed at [facility], [place] [address], [place], [place] [address]    Culture >=100,000 COLONIES/mL ESCHERICHIA COLI (A)  Final   Report Status 11/23/2021 FINAL  Final   Organism ID, Bacteria ESCHERICHIA COLI (A)  Final      Susceptibility   Escherichia coli - MIC*    AMPICILLIN 8 SENSITIVE Sensitive     CEFAZOLIN <=4 SENSITIVE Sensitive     CEFTRIAXONE <=0.25 SENSITIVE Sensitive     CIPROFLOXACIN >=4 RESISTANT Resistant     GENTAMICIN <=1 SENSITIVE Sensitive     IMIPENEM <=0.25 SENSITIVE Sensitive     NITROFURANTOIN <=16 SENSITIVE Sensitive      TRIMETH/SULFA <=20 SENSITIVE Sensitive     AMPICILLIN/SULBACTAM <=2 SENSITIVE Sensitive     * >=100,000 COLONIES/mL ESCHERICHIA COLI  Blood Culture ID Panel (Reflexed)     Status: Abnormal   Collection Time: 11/20/21  4:18 PM  Result Value Ref Range Status   Enterococcus faecalis NOT DETECTED NOT DETECTED Final   Enterococcus Faecium NOT DETECTED NOT DETECTED Final   Listeria monocytogenes NOT DETECTED NOT DETECTED Final   Staphylococcus species DETECTED (A) NOT DETECTED Final    Comment: CRITICAL RESULT CALLED TO, READ BACK BY AND VERIFIED WITH: PHARMD C PIERCE [id number] AT 751 AM BY CM    Staphylococcus aureus (BCID) DETECTED (A) NOT DETECTED Final    Comment: Methicillin (oxacillin)-resistant Staphylococcus aureus (MRSA). MRSA is predictably resistant to beta-lactam antibiotics (except ceftaroline). Preferred therapy is vancomycin unless clinically contraindicated. Patient requires contact precautions if  hospitalized. CRITICAL RESULT CALLED TO, READ BACK BY AND VERIFIED WITH: PHARMD C PIERCE [id number] AT 751 AM BY CM    Staphylococcus epidermidis NOT DETECTED NOT DETECTED Final   Staphylococcus lugdunensis NOT DETECTED NOT DETECTED Final   Streptococcus species NOT DETECTED NOT DETECTED Final   Streptococcus agalactiae NOT DETECTED NOT DETECTED Final   Streptococcus pneumoniae NOT DETECTED NOT DETECTED Final   Streptococcus pyogenes NOT DETECTED NOT DETECTED Final   A.calcoaceticus-baumannii NOT DETECTED NOT DETECTED Final   Bacteroides fragilis NOT DETECTED NOT DETECTED Final   Enterobacterales NOT DETECTED NOT DETECTED Final   Enterobacter cloacae complex NOT DETECTED NOT DETECTED Final   Escherichia coli NOT DETECTED NOT DETECTED Final   Klebsiella aerogenes NOT DETECTED NOT DETECTED Final   Klebsiella oxytoca NOT DETECTED NOT DETECTED Final   Klebsiella pneumoniae NOT DETECTED NOT DETECTED Final   Proteus species NOT DETECTED NOT DETECTED Final   Salmonella species NOT DETECTED  NOT DETECTED Final   Serratia marcescens NOT DETECTED NOT DETECTED Final   Haemophilus influenzae NOT DETECTED NOT DETECTED Final   Neisseria meningitidis NOT DETECTED NOT DETECTED Final   Pseudomonas aeruginosa NOT DETECTED NOT DETECTED Final   Stenotrophomonas maltophilia NOT DETECTED NOT DETECTED Final   Candida albicans NOT DETECTED NOT DETECTED Final   Candida auris NOT DETECTED NOT DETECTED Final   Candida glabrata NOT DETECTED NOT DETECTED Final   Candida krusei NOT DETECTED NOT DETECTED Final   Candida parapsilosis NOT DETECTED NOT DETECTED Final   Candida tropicalis NOT DETECTED NOT DETECTED Final   Cryptococcus neoformans/gattii NOT DETECTED NOT DETECTED Final   Meth resistant mecA/C and MREJ DETECTED (A) NOT DETECTED Final    Comment: CRITICAL RESULT CALLED TO, READ BACK BY AND VERIFIED WITH: PHARMD C PIERCE [id number] AT 751 AM BY CM Performed at [facility], [address] [place], [place] [address]   Blood Culture (routine x 2)  Status: None (Preliminary result)   Collection Time: 11/20/21  4:23 PM   Specimen: BLOOD RIGHT FOREARM  Result Value Ref Range Status   Specimen Description BLOOD RIGHT FOREARM  Final   Special Requests   Final    BOTTLES DRAWN AEROBIC ONLY Blood Culture adequate volume   Culture   Final    NO GROWTH 3 DAYS Performed at [facility], [address] [place], [place] [address]    Report Status PENDING  Incomplete  Culture, blood (Routine X 2) w Reflex to ID Panel     Status: None (Preliminary result)   Collection Time: 11/21/21  8:21 AM   Specimen: BLOOD  Result Value Ref Range Status   Specimen Description BLOOD SITE NOT SPECIFIED  Final   Special Requests   Final    BOTTLES DRAWN AEROBIC AND ANAEROBIC Blood Culture results may not be optimal due to an inadequate volume of blood received in culture bottles   Culture   Final    NO GROWTH 3 DAYS Performed at [facility], [address], [place], [place] [address]     Report Status PENDING  Incomplete  MRSA Next Gen by PCR, Nasal     Status: None   Collection Time: 11/21/21  1:46 PM   Specimen: Nasal Mucosa; Nasal Swab  Result Value Ref Range Status   MRSA by PCR Next Gen NOT DETECTED NOT DETECTED Final    Comment: (NOTE) The GeneXpert MRSA Assay (FDA approved for NASAL specimens only), is one component of a comprehensive MRSA colonization surveillance program. It is not intended to diagnose MRSA infection nor to guide or monitor treatment for MRSA infections. Test performance is not FDA approved in patients less than 60 years old. Performed at [facility], [address], [place], [place] [address]     Pertinent Lab.    Latest Ref Rng & Units 11/25/2021    3:52 AM 11/24/2021    4:08 AM 11/23/2021    6:58 AM  CBC  WBC 4.0 - 10.5 K/uL 14.0  14.6  12.4   Hemoglobin 12.0 - 15.0 g/dL 04.1  36.4  38.3   Hematocrit 36.0 - 46.0 % 36.9  38.3  35.8   Platelets 150 - 400 K/uL 370  307  277       Latest Ref Rng & Units 11/25/2021    3:52 AM 11/24/2021    4:08 AM 11/23/2021    8:27 PM  CMP  Glucose 70 - 99 mg/dL 779  396    BUN 6 - 20 mg/dL 7  6    Creatinine 8.86 - 1.00 mg/dL 4.84  7.20    Sodium 721 - 145 mmol/L 130  131    Potassium 3.5 - 5.1 mmol/L 3.8  4.6  3.0   Chloride 98 - 111 mmol/L 96  97    CO2 22 - 32 mmol/L 22  24    Calcium 8.9 - 10.3 mg/dL 9.1  9.0       Pertinent Imaging today Plain films and CT images have been personally visualized and interpreted; radiology reports have been reviewed. Decision making incorporated into the Impression / Recommendations.  CT ABDOMEN PELVIS W CONTRAST  Result Date: 11/24/2021 CLINICAL DATA:  Sepsis EXAM: CT ABDOMEN AND PELVIS WITH CONTRAST TECHNIQUE: Multidetector CT imaging of the abdomen and pelvis was performed using the standard protocol following bolus administration of intravenous contrast. RADIATION DOSE REDUCTION: This exam was performed according to the departmental  dose-optimization program which includes automated exposure control, adjustment of the mA and/or kV according to patient size and/or use of iterative reconstruction technique. CONTRAST:  67mL OMNIPAQUE IOHEXOL 300 MG/ML  SOLN COMPARISON:  11/19/2021 FINDINGS: Lower chest: Trace bilateral effusions and dependent bibasilar atelectasis. Hepatobiliary: No focal hepatic abnormality. Gallbladder unremarkable. Pancreas: No focal abnormality or ductal dilatation. Spleen: No focal abnormality.  Normal size. Adrenals/Urinary Tract: No adrenal abnormality. No focal renal abnormality. No stones or hydronephrosis. Urinary bladder is unremarkable. Stomach/Bowel: Stomach, large and small bowel grossly unremarkable. Normal appendix. Vascular/Lymphatic: No evidence of aneurysm or adenopathy. Reproductive: Uterus and adnexa unremarkable.  No mass. Other: Trace free fluid in the pelvis.  No free air. Musculoskeletal: No acute bony abnormality. IMPRESSION: No acute findings in the abdomen or pelvis. Trace bilateral pleural effusions.  Dependent bibasilar atelectasis. Electronically Signed   By: Charlett Nose M.D.   On: 11/24/2021 23:22    I spent 50 minutes for this patient encounter including review of prior medical records, coordination of care with primary/other specialist with greater than 50% of time being face to face/counseling and discussing diagnostics/treatment plan with the patient/family.  Electronically signed by:   Odette Fraction, MD Infectious Disease Physician [facility] Pager: [phone]

## 2021-11-26 ENCOUNTER — Other Ambulatory Visit (HOSPITAL_COMMUNITY): Payer: Self-pay

## 2021-11-26 ENCOUNTER — Encounter (HOSPITAL_COMMUNITY): Payer: Self-pay | Admitting: Internal Medicine

## 2021-11-26 ENCOUNTER — Encounter (HOSPITAL_COMMUNITY)
Admission: EM | Disposition: A | Discharge status: Home / Self Care | Payer: Self-pay | Source: Home / Self Care | Attending: Internal Medicine

## 2021-11-26 ENCOUNTER — Inpatient Hospital Stay (HOSPITAL_COMMUNITY): Payer: 59

## 2021-11-26 ENCOUNTER — Inpatient Hospital Stay (HOSPITAL_COMMUNITY): Payer: 59 | Admitting: Certified Registered Nurse Anesthetist

## 2021-11-26 ENCOUNTER — Telehealth (HOSPITAL_COMMUNITY): Payer: Self-pay

## 2021-11-26 DIAGNOSIS — I10 Essential (primary) hypertension: Secondary | ICD-10-CM

## 2021-11-26 DIAGNOSIS — F17200 Nicotine dependence, unspecified, uncomplicated: Secondary | ICD-10-CM | POA: Diagnosis not present

## 2021-11-26 DIAGNOSIS — I071 Rheumatic tricuspid insufficiency: Secondary | ICD-10-CM

## 2021-11-26 DIAGNOSIS — R7881 Bacteremia: Secondary | ICD-10-CM

## 2021-11-26 DIAGNOSIS — N10 Acute tubulo-interstitial nephritis: Secondary | ICD-10-CM

## 2021-11-26 DIAGNOSIS — A419 Sepsis, unspecified organism: Secondary | ICD-10-CM | POA: Diagnosis not present

## 2021-11-26 DIAGNOSIS — I38 Endocarditis, valve unspecified: Secondary | ICD-10-CM | POA: Diagnosis not present

## 2021-11-26 DIAGNOSIS — N390 Urinary tract infection, site not specified: Secondary | ICD-10-CM | POA: Diagnosis not present

## 2021-11-26 HISTORY — PX: TEE WITHOUT CARDIOVERSION: SHX5443

## 2021-11-26 HISTORY — PX: BUBBLE STUDY: SHX6837

## 2021-11-26 LAB — BASIC METABOLIC PANEL
Anion gap: 11 (ref 5–15)
BUN: 5 mg/dL — ABNORMAL LOW (ref 6–20)
CO2: 23 mmol/L (ref 22–32)
Calcium: 9 mg/dL (ref 8.9–10.3)
Chloride: 99 mmol/L (ref 98–111)
Creatinine, Ser: 0.53 mg/dL (ref 0.44–1.00)
GFR, Estimated: >60 mL/min (ref >60)
Glucose, Bld: 102 mg/dL — ABNORMAL HIGH (ref 70–99)
Potassium: 3.5 mmol/L (ref 3.5–5.1)
Sodium: 133 mmol/L — ABNORMAL LOW (ref 135–145)

## 2021-11-26 LAB — CBC
HCT: 35.8 % — ABNORMAL LOW (ref 36.0–46.0)
Hemoglobin: 12.1 g/dL (ref 12.0–15.0)
MCH: 25.7 pg — ABNORMAL LOW (ref 26.0–34.0)
MCHC: 33.8 g/dL (ref 30.0–36.0)
MCV: 76.2 fL — ABNORMAL LOW (ref 80.0–100.0)
Platelets: 435 10*3/uL — ABNORMAL HIGH (ref 150–400)
RBC: 4.7 MIL/uL (ref 3.87–5.11)
RDW: 13.8 % (ref 11.5–15.5)
WBC: 12.6 10*3/uL — ABNORMAL HIGH (ref 4.0–10.5)
nRBC: 0 % (ref 0.0–0.2)

## 2021-11-26 LAB — VANCOMYCIN, TROUGH: Vancomycin Tr: 9 ug/mL — ABNORMAL LOW (ref 15–20)

## 2021-11-26 LAB — GLUCOSE, CAPILLARY
Glucose-Capillary: 108 mg/dL — ABNORMAL HIGH (ref 70–99)
Glucose-Capillary: 104 mg/dL — ABNORMAL HIGH (ref 70–99)
Glucose-Capillary: 108 mg/dL — ABNORMAL HIGH (ref 70–99)

## 2021-11-26 SURGERY — ECHOCARDIOGRAM, TRANSESOPHAGEAL
Anesthesia: General

## 2021-11-26 MED ORDER — SODIUM CHLORIDE 0.9 % IV SOLN: INTRAVENOUS | Status: DC

## 2021-11-26 MED ORDER — VANCOMYCIN HCL 500 MG/100ML IV SOLN
500.0000 mg | Freq: Three times a day (TID) | INTRAVENOUS | Status: DC
Start: 2021-11-26 — End: 2021-11-29
  Administered 2021-11-26 – 2021-11-29 (×9): 500 mg via INTRAVENOUS
  Filled 2021-11-26 (×11): qty 100

## 2021-11-26 MED ORDER — PROPOFOL 500 MG/50ML IV EMUL
INTRAVENOUS | Status: DC | PRN
  Administered 2021-11-26: 150 ug/kg/min via INTRAVENOUS

## 2021-11-26 MED ORDER — SODIUM CHLORIDE 0.9 % IV SOLN: INTRAVENOUS | Status: DC | PRN

## 2021-11-26 MED ORDER — PROPOFOL 10 MG/ML IV BOLUS
INTRAVENOUS | Status: DC | PRN
  Administered 2021-11-26: 20 mg via INTRAVENOUS

## 2021-11-26 MED ORDER — ORAL CARE MOUTH RINSE: 15.0000 mL | OROMUCOSAL | Status: DC | PRN

## 2021-11-26 MED ORDER — LINEZOLID 600 MG PO TABS
600.0000 mg | ORAL_TABLET | Freq: Two times a day (BID) | ORAL | 0 refills | Status: AC
  Filled 2021-11-26: qty 14, 7d supply, fill #0

## 2021-11-26 NOTE — Progress Notes (Signed)
Pharmacy Antibiotic Note  Leah Rose is a 51 y.o. female admitted on 11/20/2021 and found to have MRSA bacteremia. Pharmacy has been consulted for Vancomycin dosing.  The vancomycin peak (drawn ~72min early) is 36 mcg/ml and the vancomycin trough is 29mcg/ml with a therapeutic AUC of 494. In the setting of bacteremia, troughs <10 will not clear the infection as quickly--will adjust the dose for an estimated trough of 12.   Plan: - Adjust vancomycin to 500mg  IV every 8 hours (eAUC 493).  - Will continue to follow renal function, culture results, LOT, and antibiotic de-escalation plans.    Height: 4\' 11"  (149.9 cm) Weight: 45.8 kg (101 lb) IBW/kg (Calculated) : 43.2  Temp (24hrs), Avg:98.5 F (36.9 C), Min:98.1 F (36.7 C), Max:98.8 F (37.1 C)  Recent Labs  Lab 11/20/21 1744 11/20/21 2109 11/21/21 2110 11/23/21 0658 11/24/21 0408 11/24/21 1326 11/25/21 0352 11/25/21 1842 11/26/21 0505  WBC  --   --  8.7 12.4* 14.6*  --  14.0*  --  12.6*  CREATININE  --   --  0.64 0.63 0.60  --  0.60  --  0.53  LATICACIDVEN 2.6* 1.7  --   --   --   --   --   --   --   VANCOTROUGH  --   --   --   --   --   --   --   --  9*  VANCOPEAK  --   --   --   --   --  14*  --  36  --      Estimated Creatinine Clearance: 56.1 mL/min (by C-G formula based on SCr of 0.53 mg/dL).    Allergies  Allergen Reactions   Keflex [Cephalexin] Hives, Itching and Swelling   Penicillins Itching   Sulfonamide Derivatives Itching and Rash    Antimicrobials this admission: Cipro 8/19 >> 8/22 Vancomycin 8/20 >> Cefazolin 8/22 >>  Dose adjustments this admission: 8/25: Vancomycin 750mg  q12h >> 500mg  q8h   Microbiology results: 8/19 UCx >> E.coli (pan-S except R-Cipro) 8/19 BCx >> 2/4 MRSA 8/20 BCx >> [id number]   Thank you for allowing pharmacy to be a part of this patient's care.  , PharmD PGY1 Pharmacy Resident 8/25/20239:28 AM

## 2021-11-26 NOTE — CV Procedure (Signed)
TEE  Indication  Bacteremia  Patient anesthetized with Propofol intravenously by anesthesia Bite guard placed in mouth TEE probe advanced through mouth to mid esophagus without difficulty  TV normal Triv TR PV normal AV normal  No AI MV normal  No AI  LVEF and RVEF normal  No PFO by color doppler   With injection of agitated saline, few small bubbles seen in left atriurm late consistent with small intrapulmonary shunt but no PFO  Minimal fixed atheroscleroic plaquing of aorta  No vegetations seen  Full report to follow  No complications  Dietrich Pates MD

## 2021-11-26 NOTE — Progress Notes (Addendum)
NAME:  Leah Rose, MRN:  [id number], DOB:  1971/03/20, LOS: 5 ADMISSION DATE:  11/20/2021  Subjective  Patient evaluated at bedside this AM.Family present. Patient feels better, has persistent pain. Is hungry but doesnt like the hospital food. Was told she could have family bring food into the hospital. Drinking 2 gatorades per day as does not like water, encouraged to drink 4 per day.  Objective   Blood pressure 138/87, pulse 88, temperature 98.4 F (36.9 C), temperature source Oral, resp. rate 18, height 4\' 11"  (1.499 m), weight 45.8 kg, SpO2 99 %.     Intake/Output Summary (Last 24 hours) at 11/26/2021 1323 Last data filed at 11/26/2021 1012 Gross per 24 hour  Intake 150 ml  Output --  Net 150 ml    Filed Weights   11/21/21 0901  Weight: 45.8 kg   Physical Exam: Grossly unchanged since yesterday General: Laying in bed, pleasant,no distress, non-toxic HEENT: Normocephalic, atraumatic, no scleral icterus CV: Regular rate, rhythm, no m/r/g Pulm: lungs clear to auscultation bilaterally, good airflow bilaterally Abdomen: Normoactive bowel sounds. Soft, non-tender, non-distended, no palpable masses. No CVA tenderness.  Skin: Rough, scaling patch on L neck, no open wounds appreciated. Evidence of recently popped boil on the right arm without erythema, fluctuance or discharge. Skin is diffusely warm to the touch. No splinter hemorrhages MSK: Pain to palpation over the left lower back/eft sacroiliac joint area. No midline back pain, no CVAT. Neuro: Awake, alert, oriented  Labs       Latest Ref Rng & Units 11/26/2021    5:05 AM 11/25/2021    3:52 AM 11/24/2021    4:08 AM  CBC  WBC 4.0 - 10.5 K/uL 12.6  14.0  14.6   Hemoglobin 12.0 - 15.0 g/dL 11/26/2021  59.7  41.6   Hematocrit 36.0 - 46.0 % 35.8  36.9  38.3   Platelets 150 - 400 K/uL 435  370  307       Latest Ref Rng & Units 11/26/2021    5:05 AM 11/25/2021    3:52 AM 11/24/2021    4:08 AM  BMP  Glucose 70 - 99 mg/dL 11/26/2021  536   468   BUN 6 - 20 mg/dL 5  7  6    Creatinine 0.44 - 1.00 mg/dL 032   1.22   Sodium 135 - 145 mmol/L 133  130  131   Potassium 3.5 - 5.1 mmol/L 3.5  3.8  4.6   Chloride 98 - 111 mmol/L 99  96  97   CO2 22 - 32 mmol/L 23  22  24    Calcium 8.9 - 10.3 mg/dL 9.0  9.1  9.0    Summary   Leah Rose is 51yo with tobacco use disorder, chronic venous insufficiency, hypertension, TIA admitted 8/19 for cephalosporin allergic reaction and found to have MRSA bacteremia.  Assessment & Plan:  Principal Problem:   Sepsis secondary to UTI (HCC)  Uncomplicated MRSA bacteremia  Afebrile, vital signs are stable with improving wbc 14 to 12.6. Repeat blood cultures from 8/20 with no growth at 4 days. TEE today without valvular lesions or evidence of endocarditis. Per ID will need to complete 14 total days of MRSA treatment from the date of culture since this is uncomplicated bacteremia, and can be done so on oral linezolid. -IV vanc(day 6/14), transition to oral linezolid at discharge - PRN tylenol for fever and pain - Follow-up repeat blood cultures -ID following  E coli urinary tract infection The  patient was found to have E coli UTI with no CVA tenderness and no intraabdominal infection on CT A/P non-contrast at admission. Repeat CT AP w contrast 8/23 without evidence of pyelo. She was initially treated with ciprofloxacin and was switched to cefazolin 8/22 per ID. The left sided pain seems to be secondary to muscle tightness and spasm of the hamstrings, gluteal muscles and paraspinal muscles of the left lumbosacral region. Do not think this is pyelo given negative image findings and no CVA tenderness, but will continue treating this as clinical pyelonephritis per ID.  -Continue IV cefazolin for 7 day total course (day 4/7)   L lower back pain Patient says this was an acute onset lower back pain without trauma. No radiation down the leg coming from the back to suggest radiculopathy. CT A/P w  contrast 8/23 without an identifyable cause on imaging.Lidocaine patch has still provided no relief. The patient endorses good response to heat. Given her tight hamstrings on that side am curious if this is muscle spasm. She has not had a BM since Monday which may be causing this internal L lower back pain as well.  -Senna 2 tablets daily -Mirilax 17g daily -Apply heat to SI joint/left lower back  Best practice:  DIET: CM IVF: na DVT PPX: lovenox BOWEL: na CODE: FULL FAM COM: na  Evlyn Kanner, MD Internal Medicine Resident PGY-3 PAGER: [phone] 11/26/2021 1:23 PM  If after hours (below), please contact on-call pager: [phone] 5PM-7AM Monday-Friday 1PM-7AM Saturday-Sunday

## 2021-11-26 NOTE — Progress Notes (Signed)
Physical Therapy Treatment Patient Details Name: Leah Rose MRN: [id number] DOB: May 09, 1970 Today's Date: 11/26/2021   History of Present Illness Pt is 51 yo female presented with persistent L flank pain on 11/20/21.  Pt found to have MRSA bacteremia/Ecoli UTI with sepsis.  Pt with hx including chronic venous insufficiency, HTN, TIA    PT Comments    Pt reporting L lower back pain, but otherwise reports feeling better and agreeable to OOB mobility. Pt ambulatory around unit with use of RW for self-steadying, overall pt mobilizing at supervision level today. PT to continue to follow.    Recommendations for follow up therapy are one component of a multi-disciplinary discharge planning process, led by the attending physician.  Recommendations may be updated based on patient status, additional functional criteria and insurance authorization.  Follow Up Recommendations  Home health PT     Assistance Recommended at Discharge Frequent or constant Supervision/Assistance  Patient can return home with the following Help with stairs or ramp for entrance;A little help with walking and/or transfers;Assistance with cooking/housework   Equipment Recommendations  None recommended by PT    Recommendations for Other Services       Precautions / Restrictions Precautions Precautions: Fall Restrictions Weight Bearing Restrictions: No     Mobility  Bed Mobility Overal bed mobility: Needs Assistance Bed Mobility: Supine to Sit, Sit to Supine     Supine to sit: Supervision Sit to supine: Supervision   General bed mobility comments: safety    Transfers Overall transfer level: Needs assistance Equipment used: Rolling walker (2 wheels) Transfers: Sit to/from Stand Sit to Stand: Supervision           General transfer comment: for safety    Ambulation/Gait Ambulation/Gait assistance: Supervision Gait Distance (Feet): 230 Feet Assistive device: Rolling walker (2 wheels) Gait  Pattern/deviations: Step-through pattern, Decreased stride length, Trunk flexed, Shuffle Gait velocity: decr     General Gait Details: cues for upright posture throughout   Stairs             Wheelchair Mobility    Modified Rankin (Stroke Patients Only)       Balance Overall balance assessment: Needs assistance Sitting-balance support: Bilateral upper extremity supported Sitting balance-Leahy Scale: Fair     Standing balance support: Reliant on assistive device for balance Standing balance-Leahy Scale: Poor Standing balance comment: reliant on RW for standing                            Cognition Arousal/Alertness: Awake/alert Behavior During Therapy: WFL for tasks assessed/performed Overall Cognitive Status: History of cognitive impairments - at baseline                                 General Comments: 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        Exercises      General Comments        Pertinent Vitals/Pain Pain Assessment Pain Assessment: Faces Faces Pain Scale: Hurts even more Pain Location: low back pain with movement, L side (TTP glute med) Pain Descriptors / Indicators: Grimacing, Guarding, Sharp Pain Intervention(s): Monitored during session, Repositioned, Limited activity within patient's tolerance    Home Living                          Prior Function  PT Goals (current goals can now be found in the care plan section) Acute Rehab PT Goals Patient Stated Goal: decrease back pain PT Goal Formulation: With patient Time For Goal Achievement: 12/07/21 Potential to Achieve Goals: Good Progress towards PT goals: Progressing toward goals    Frequency    Min 3X/week      PT Plan Current plan remains appropriate    Co-evaluation              AM-PAC PT "6 Clicks" Mobility   Outcome Measure  Help needed turning from your back to your side while in a flat bed without using  bedrails?: None Help needed moving from lying on your back to sitting on the side of a flat bed without using bedrails?: A Little Help needed moving to and from a bed to a chair (including a wheelchair)?: A Little Help needed standing up from a chair using your arms (e.g., wheelchair or bedside chair)?: A Little Help needed to walk in hospital room?: A Little Help needed climbing 3-5 steps with a railing? : A Little 6 Click Score: 19    End of Session   Activity Tolerance: Patient tolerated treatment well Patient left: in bed;with call bell/phone within reach;with family/visitor present Nurse Communication: Mobility status PT Visit Diagnosis: Unsteadiness on feet (R26.81);Other abnormalities of gait and mobility (R26.89);Pain Pain - part of body:  (low back pain)     Time: 1411-1434 PT Time Calculation (min) (ACUTE ONLY): 23 min  Charges:  $Therapeutic Activity: 8-22 mins                    Marye Round, PT DPT Acute Rehabilitation Services Pager [phone]  Office [phone]   Truddie Coco 11/26/2021, 3:53 PM

## 2021-11-26 NOTE — Anesthesia Postprocedure Evaluation (Signed)
Anesthesia Post Note  Patient: Leah Rose  Procedure(s) Performed: TRANSESOPHAGEAL ECHOCARDIOGRAM (TEE) BUBBLE STUDY     Patient location during evaluation: Endoscopy Anesthesia Type: General Level of consciousness: awake Pain management: pain level controlled Vital Signs Assessment: post-procedure vital signs reviewed and stable Respiratory status: spontaneous breathing Cardiovascular status: stable Postop Assessment: no apparent nausea or vomiting Anesthetic complications: no   No notable events documented.  Last Vitals:  Vitals:   11/26/21 1025 11/26/21 1040  BP: 102/74 106/70  Pulse: 83 85  Resp: 17 19  Temp:  36.8 C  SpO2: 99% 99%    Last Pain:  Vitals:   11/26/21 1040  TempSrc:   PainSc: 0-No pain                 Vi Whitesel

## 2021-11-26 NOTE — Progress Notes (Signed)
  Progress Note   Date: 11/24/2021  Patient Name: Leah Rose        MRN#: [id number]  Review the patient's clinical findings supports the diagnosis of:   Hyponatremia , not clinically significant

## 2021-11-26 NOTE — Progress Notes (Addendum)
[facility] Infectious Diseases Follow Up Note  Patient Identification: Patient Name: Leah Rose MRN: [id number] Admit Date: 11/20/2021 12:19 PM Age: 51 y.o.Today's Date: 11/26/2021  Reason for Visit: MRSA bacteremia and pyelonephritis   Principal Problem:   Sepsis secondary to UTI [facility])  Antibiotics:  Cephalexin 81/8 Ciprofloxacin 81/9-8/22, cefazolin 8/22- Vancomycin 8/20-   Lines/Hardware: PIV   Interval Events: remains afebrile, WBC down to 12.6, TEE prelim with no vegetations    Assessment 51 year old female with PMH of HTN, depression, chronic venous insufficiency who presented to the ED with persistent left flank/back pain, allergic reaction to cephalexin. Admitted for    # Clinical left sided pyelonephritis ( fever and left sided back/flankpain) 8/18 Urine cx E coli, R to cipro and levofloxacin  8/19 Urine E coli , R to cipro   # MRSA bacteremia - she has a ruptured boil in her rt arm and likely the cause for MRSA bacteremia. She complains of left lower back pain. No spinal tenderness, no signs of septic peripheral joints or no known hardwares Repeat blood cx 8/20 NG in 4 days  TTE 8/21 unremarkable for vegetations or endocarditis.  TEE 8/25 prelim with no vegetations or concerns for endocarditis   # Left lower back pain - mild tenderness ? Muscle spasm.  she does not have midline back pain or spinal tenderness. CT abdomen/pelvis 823 was unremarkable, on analgesics   # Allergy to Penicillin/Cephalexin/Sulpha - no issues with IV trial of IV cefazolin and currently on IV cefazolin for tx of pyelonephritis without any complaints   Recommendations Complete 7 days of IV cefazolin to complete course for pyelonephritis. Would not prefer to switch to PO antibiotics given her resistance profile and multiple allergies as stated previously  Continue Vancomycin, pharmacy to dose while IP. Ok to switch to linezolid when ready  to go home to complete 14 days of treatment from date of negative blood cx 8/19 given uncomplicated staph bacteremia. She is on duloxetine  po daily, low concerns for DDI with linezolid ( d/w ID pharm D) if cannot stop while on linezolid  Analgesics for left lower back pain per PPM   Will follow in the clinic. Otherwise, ID will sign off now.   Rest of the management as per the primary team. Thank you for the consult. Please page with pertinent questions or concerns.  ______________________________________________________________________ Subjective patient seen and examined at the bedside.  Left lower back pain is better when getting analgesics and comes rt back when wears off. It is 6-7/10 when analgesics  wears off.  No mid line back pain, peripheral joint pain   Vitals BP 101/72 (BP Location: Left Arm)   Pulse 81   Temp 98.1 F (36.7 C) (Oral)   Resp 16   Ht  (1.499 m)   Wt 45.8 kg   SpO2 95%   BMI 20.40 kg/m     Physical Exam Constitutional: lying in the bed, appears comfortable     Comments:   Cardiovascular:     Rate and Rhythm: Normal rate and regular rhythm.     Heart sounds:  Pulmonary:     Effort: Pulmonary effort is normal on room air     Comments:   Abdominal:     Palpations: Abdomen is soft.     Tenderness: non distended   Musculoskeletal:        General: No swelling or tenderness. No midline spinal tenderness, mild tenderness at left lower back, no overlying cellulitis, fluctuance or crepitus  Skin:    Comments: Rt arm ruptured boil, no surrounding cellulitis   Neurological:     General: grossly non focal, awake, alert and oriented   Psychiatric:        Mood and Affect: Mood normal.   Pertinent Microbiology Results for orders placed or performed during the hospital encounter of 11/20/21  Susceptibility, Aer + Anaerob     Status: Abnormal   Collection Time: 11/19/21 11:16 AM  Result Value Ref Range Status   Suscept, Aer + Anaerob  Final report (A)  Corrected    Comment: (NOTE) Performed At: [facility] [employer] [address] [place], [place] [id number] Jolene Schimke MD [id number] CORRECTED ON 08/23 AT 2135: PREVIOUSLY REPORTED AS Preliminary report    Source of Sample E. COLI/ URINE  Final    Comment: Performed at [facility], [address] [place], [place] [address]  Susceptibility Result     Status: Abnormal   Collection Time: 11/19/21 11:16 AM  Result Value Ref Range Status   Suscept Result 1 Escherichia coli (A)  Final    Comment: (NOTE) Identification performed by account, not confirmed by this laboratory. Testing performed by broth microdilution.    Antimicrobial Suscept Comment  Corrected    Comment: (NOTE)      ** S = Susceptible; I = Intermediate; R = Resistant **                   P = Positive; N = Negative            MICS are expressed in micrograms per mL   Antibiotic                 RSLT#1    RSLT#2    RSLT#3    RSLT#4 Amoxicillin/Clavulanic Acid    S Ampicillin                     S Ceftriaxone                    S Cefuroxime                     I Ciprofloxacin                  R Ertapenem                      S Gentamicin                     S Imipenem                       S Levofloxacin                   R Meropenem                      S Nitrofurantoin                 S Tetracycline                   S Tobramycin                     S Trimethoprim/Sulfa             S Performed At: [facility] [employer] [address] [place], [place] [id number] Jolene Schimke MD [id number]   Resp  Panel by RT-PCR (Flu A&B, Covid) Anterior Nasal Swab     Status: None   Collection Time: 11/20/21  4:18 PM   Specimen: Anterior Nasal Swab  Result Value Ref Range Status   SARS Coronavirus 2 by RT PCR NEGATIVE NEGATIVE Final    Comment: (NOTE) SARS-CoV-2 target nucleic acids are NOT DETECTED.  The SARS-CoV-2 RNA is generally detectable in upper respiratory specimens during the acute  phase of infection. The lowest concentration of SARS-CoV-2 viral copies this assay can detect is 138 copies/mL. A negative result does not preclude SARS-Cov-2 infection and should not be used as the sole basis for treatment or other patient management decisions. A negative result may occur with  improper specimen collection/handling, submission of specimen other than nasopharyngeal swab, presence of viral mutation(s) within the areas targeted by this assay, and inadequate number of viral copies(<138 copies/mL). A negative result must be combined with clinical observations, patient history, and epidemiological information. The expected result is Negative.  Fact Sheet for Patients:  BloggerCourse.com  Fact Sheet for Healthcare Providers:  SeriousBroker.it  This test is no t yet approved or cleared by the [place] FDA and  has been authorized for detection and/or diagnosis of SARS-CoV-2 by FDA under an Emergency Use Authorization (EUA). This EUA will remain  in effect (meaning this test can be used) for the duration of the COVID-19 declaration under Section 564(b)(1) of the Act, 21 U.S.C.section 360bbb-3(b)(1), unless the authorization is terminated  or revoked sooner.       Influenza A by PCR NEGATIVE NEGATIVE Final   Influenza B by PCR NEGATIVE NEGATIVE Final    Comment: (NOTE) The Xpert Xpress SARS-CoV-2/FLU/RSV plus assay is intended as an aid in the diagnosis of influenza from Nasopharyngeal swab specimens and should not be used as a sole basis for treatment. Nasal washings and aspirates are unacceptable for Xpert Xpress SARS-CoV-2/FLU/RSV testing.  Fact Sheet for Patients: BloggerCourse.com  Fact Sheet for Healthcare Providers: SeriousBroker.it  This test is not yet approved or cleared by the [place] FDA and has been authorized for detection and/or diagnosis of  SARS-CoV-2 by FDA under an Emergency Use Authorization (EUA). This EUA will remain in effect (meaning this test can be used) for the duration of the COVID-19 declaration under Section 564(b)(1) of the Act, 21 U.S.C. section 360bbb-3(b)(1), unless the authorization is terminated or revoked.  Performed at [facility], [address] [place], [place] [address]   Blood Culture (routine x 2)     Status: Abnormal   Collection Time: 11/20/21  4:18 PM   Specimen: BLOOD RIGHT FOREARM  Result Value Ref Range Status   Specimen Description BLOOD RIGHT FOREARM  Final   Special Requests   Final    BOTTLES DRAWN AEROBIC AND ANAEROBIC Blood Culture adequate volume   Culture  Setup Time   Final    GRAM POSITIVE COCCI IN BOTH AEROBIC AND ANAEROBIC BOTTLES CRITICAL RESULT CALLED TO, READ BACK BY AND VERIFIED WITH: PHARMD C PIERCE [id number] AT 752 AM BY CM Performed at [facility], [address] [place], [place] [address]    Culture METHICILLIN RESISTANT STAPHYLOCOCCUS AUREUS (A)  Final   Report Status 11/23/2021 FINAL  Final   Organism ID, Bacteria METHICILLIN RESISTANT STAPHYLOCOCCUS AUREUS  Final      Susceptibility   Methicillin resistant staphylococcus aureus - MIC*    CIPROFLOXACIN <=0.5 SENSITIVE Sensitive     ERYTHROMYCIN <=0.25 SENSITIVE Sensitive     GENTAMICIN <=0.5 SENSITIVE  Sensitive     OXACILLIN >=4 RESISTANT Resistant     TETRACYCLINE <=1 SENSITIVE Sensitive     VANCOMYCIN 1 SENSITIVE Sensitive     TRIMETH/SULFA <=10 SENSITIVE Sensitive     CLINDAMYCIN <=0.25 SENSITIVE Sensitive     RIFAMPIN <=0.5 SENSITIVE Sensitive     Inducible Clindamycin NEGATIVE Sensitive     * METHICILLIN RESISTANT STAPHYLOCOCCUS AUREUS  Urine Culture     Status: Abnormal   Collection Time: 11/20/21  4:18 PM   Specimen: In/Out Cath Urine  Result Value Ref Range Status   Specimen Description IN/OUT CATH URINE  Final   Special Requests   Final    NONE Performed at [facility],  [address] [place], [place] [id number]    Culture >=100,000 COLONIES/mL ESCHERICHIA COLI (A)  Final   Report Status 11/23/2021 FINAL  Final   Organism ID, Bacteria ESCHERICHIA COLI (A)  Final      Susceptibility   Escherichia coli - MIC*    AMPICILLIN 8 SENSITIVE Sensitive     CEFAZOLIN <=4 SENSITIVE Sensitive     CEFTRIAXONE <=0.25 SENSITIVE Sensitive     CIPROFLOXACIN >=4 RESISTANT Resistant     GENTAMICIN <=1 SENSITIVE Sensitive     IMIPENEM <=0.25 SENSITIVE Sensitive     NITROFURANTOIN <=16 SENSITIVE Sensitive     TRIMETH/SULFA <=20 SENSITIVE Sensitive     AMPICILLIN/SULBACTAM <=2 SENSITIVE Sensitive     * >=100,000 COLONIES/mL ESCHERICHIA COLI  Blood Culture ID Panel (Reflexed)     Status: Abnormal   Collection Time: 11/20/21  4:18 PM  Result Value Ref Range Status   Enterococcus faecalis NOT DETECTED NOT DETECTED Final   Enterococcus Faecium NOT DETECTED NOT DETECTED Final   Listeria monocytogenes NOT DETECTED NOT DETECTED Final   Staphylococcus species DETECTED (A) NOT DETECTED Final    Comment: CRITICAL RESULT CALLED TO, READ BACK BY AND VERIFIED WITH: PHARMD C PIERCE [phone] AT 751 AM BY CM    Staphylococcus aureus (BCID) DETECTED (A) NOT DETECTED Final    Comment: Methicillin (oxacillin)-resistant Staphylococcus aureus (MRSA). MRSA is predictably resistant to beta-lactam antibiotics (except ceftaroline). Preferred therapy is vancomycin unless clinically contraindicated. Patient requires contact precautions if  hospitalized. CRITICAL RESULT CALLED TO, READ BACK BY AND VERIFIED WITH: PHARMD C PIERCE [phone] AT 751 AM BY CM    Staphylococcus epidermidis NOT DETECTED NOT DETECTED Final   Staphylococcus lugdunensis NOT DETECTED NOT DETECTED Final   Streptococcus species NOT DETECTED NOT DETECTED Final   Streptococcus agalactiae NOT DETECTED NOT DETECTED Final   Streptococcus pneumoniae NOT DETECTED NOT DETECTED Final   Streptococcus pyogenes NOT DETECTED NOT DETECTED Final    A.calcoaceticus-baumannii NOT DETECTED NOT DETECTED Final   Bacteroides fragilis NOT DETECTED NOT DETECTED Final   Enterobacterales NOT DETECTED NOT DETECTED Final   Enterobacter cloacae complex NOT DETECTED NOT DETECTED Final   Escherichia coli NOT DETECTED NOT DETECTED Final   Klebsiella aerogenes NOT DETECTED NOT DETECTED Final   Klebsiella oxytoca NOT DETECTED NOT DETECTED Final   Klebsiella pneumoniae NOT DETECTED NOT DETECTED Final   Proteus species NOT DETECTED NOT DETECTED Final   Salmonella species NOT DETECTED NOT DETECTED Final   Serratia marcescens NOT DETECTED NOT DETECTED Final   Haemophilus influenzae NOT DETECTED NOT DETECTED Final   Neisseria meningitidis NOT DETECTED NOT DETECTED Final   Pseudomonas aeruginosa NOT DETECTED NOT DETECTED Final   Stenotrophomonas maltophilia NOT DETECTED NOT DETECTED Final   Candida albicans NOT DETECTED NOT DETECTED Final   Candida auris  NOT DETECTED NOT DETECTED Final   Candida glabrata NOT DETECTED NOT DETECTED Final   Candida krusei NOT DETECTED NOT DETECTED Final   Candida parapsilosis NOT DETECTED NOT DETECTED Final   Candida tropicalis NOT DETECTED NOT DETECTED Final   Cryptococcus neoformans/gattii NOT DETECTED NOT DETECTED Final   Meth resistant mecA/C and MREJ DETECTED (A) NOT DETECTED Final    Comment: CRITICAL RESULT CALLED TO, READ BACK BY AND VERIFIED WITH: PHARMD C PIERCE [id number] AT 751 AM BY CM Performed at [facility], [address] [place], [place] [address]   Blood Culture (routine x 2)     Status: None (Preliminary result)   Collection Time: 11/20/21  4:23 PM   Specimen: BLOOD RIGHT FOREARM  Result Value Ref Range Status   Specimen Description BLOOD RIGHT FOREARM  Final   Special Requests   Final    BOTTLES DRAWN AEROBIC ONLY Blood Culture adequate volume   Culture   Final    NO GROWTH 4 DAYS Performed at [facility], [address], [place], [place] [address]    Report Status PENDING  Incomplete   Culture, blood (Routine X 2) w Reflex to ID Panel     Status: None (Preliminary result)   Collection Time: 11/21/21  8:21 AM   Specimen: BLOOD  Result Value Ref Range Status   Specimen Description BLOOD SITE NOT SPECIFIED  Final   Special Requests   Final    BOTTLES DRAWN AEROBIC AND ANAEROBIC Blood Culture results may not be optimal due to an inadequate volume of blood received in culture bottles   Culture   Final    NO GROWTH 4 DAYS Performed at [facility], [address], [place], [place] [address]    Report Status PENDING  Incomplete  MRSA Next Gen by PCR, Nasal     Status: None   Collection Time: 11/21/21  1:46 PM   Specimen: Nasal Mucosa; Nasal Swab  Result Value Ref Range Status   MRSA by PCR Next Gen NOT DETECTED NOT DETECTED Final    Comment: (NOTE) The GeneXpert MRSA Assay (FDA approved for NASAL specimens only), is one component of a comprehensive MRSA colonization surveillance program. It is not intended to diagnose MRSA infection nor to guide or monitor treatment for MRSA infections. Test performance is not FDA approved in patients less than 65 years old. Performed at [facility], [address], [place], [place] [address]     Pertinent Lab.    Latest Ref Rng & Units 11/26/2021    5:05 AM 11/25/2021    3:52 AM 11/24/2021    4:08 AM  CBC  WBC 4.0 - 10.5 K/uL 12.6  14.0  14.6   Hemoglobin 12.0 - 15.0 g/dL 14.4  31.5  40.0   Hematocrit 36.0 - 46.0 % 35.8  36.9  38.3   Platelets 150 - 400 K/uL 435  370  307       Latest Ref Rng & Units 11/26/2021    5:05 AM 11/25/2021    3:52 AM 11/24/2021    4:08 AM  CMP  Glucose 70 - 99 mg/dL 867  619  509   BUN 6 - 20 mg/dL 5  7  6    Creatinine 0.44 - 1.00 mg/dL  3.26  7.12   Sodium 135 - 145 mmol/L 133  130  131   Potassium 3.5 - 5.1 mmol/L 3.5  3.8  4.6   Chloride 98 - 111  mmol/L 99  96  97   CO2 22 - 32 mmol/L 23  22  24    Calcium 8.9 - 10.3 mg/dL 9.0  9.1  9.0      Pertinent Imaging today Plain  films and CT images have been personally visualized and interpreted; radiology reports have been reviewed. Decision making incorporated into the Impression / Recommendations.  ECHO TEE  Result Date: 11/26/2021    TRANSESOPHOGEAL ECHO REPORT   Patient Name:   KRYSTN DERMODY Date of Exam: 11/26/2021 Medical Rec #:  11/28/2021      Height:       59.0 in Accession #:    [id number]     Weight:       101.0 lb Date of Birth:  [birth year]      BSA:          1.380 m Patient Age:    51 years       BP:           118/74 mmHg Patient Gender: F              HR:           103 bpm. Exam Location:  Inpatient Procedure: Transesophageal Echo, Cardiac Doppler, Color Doppler and Saline            Contrast Bubble Study Indications:    Endocarditis  History:        Patient has prior history of Echocardiogram examinations, most                 recent 11/22/2021. Risk Factors:Hypertension and Current Smoker.  Sonographer:    11/24/2021 Referring Phys: [id number] LAURA R INGOLD PROCEDURE: After discussion of the risks and benefits of a TEE, an informed consent was obtained from the patient. TEE procedure time was 9 minutes. The transesophogeal probe was passed without difficulty through the esophogus of the patient. Imaged were  obtained with the patient in a left lateral decubitus position. Sedation performed by different physician. The patient was monitored while under deep sedation. Anesthestetic sedation was provided intravenously by Anesthesiology: 161.98mg  of Propofol. Image quality was good. The patient developed no complications during the procedure. IMPRESSIONS  1. Left ventricular ejection fraction, by estimation, is 65 to 70%. The left ventricle has normal function.  2. Right ventricular systolic function is normal. The right ventricular size is normal.  3. No left atrial/left atrial appendage thrombus was detected.  4. The mitral valve is normal in structure. Trivial mitral valve regurgitation.  5. The aortic valve is normal in  structure. Aortic valve regurgitation is not visualized. FINDINGS  Left Ventricle: Left ventricular ejection fraction, by estimation, is 65 to 70%. The left ventricle has normal function. The left ventricular internal cavity size was normal in size. Right Ventricle: The right ventricular size is normal. No increase in right ventricular wall thickness. Right ventricular systolic function is normal. Left Atrium: Left atrial size was normal in size. No left atrial/left atrial appendage thrombus was detected. Right Atrium: Right atrial size was normal in size. Pericardium: There is no evidence of pericardial effusion. Mitral Valve: The mitral valve is normal in structure. Trivial mitral valve regurgitation. Tricuspid Valve: The tricuspid valve is normal in structure. Tricuspid valve regurgitation is trivial. Aortic Valve: The aortic valve is normal in structure. Aortic valve regurgitation is not visualized. Pulmonic Valve: The pulmonic valve was normal in structure. Pulmonic valve regurgitation is not visualized. Aorta: The aortic root is normal in size and structure. There is  minimal (Grade I) plaque. IAS/Shunts: Agitated saline contrast was given intravenously to evaluate for intracardiac shunting. NO PFO by color doppler. With injection of agitated saline there were few small bubbles seen late in LA. Consistent with small amount of intrapulmonary shunting. No PFO. Dietrich Pates MD Electronically signed by Dietrich Pates MD Signature Date/Time: 11/26/2021/1:03:12 PM    Final      I spent 50 minutes for this patient encounter including review of prior medical records, coordination of care with primary/other specialist with greater than 50% of time being face to face/counseling and discussing diagnostics/treatment plan with the patient/family.  Electronically signed by:   Odette Fraction, MD Infectious Disease Physician [facility] Infectious Disease Pager: [phone]

## 2021-11-26 NOTE — Telephone Encounter (Signed)
Pharmacy Patient Advocate Encounter  Insurance verification completed.    The patient is insured through [employer]   The patient is currently admitted and ran test claims for the following: Linezolid.  Copays and coinsurance results were relayed to Inpatient clinical team.

## 2021-11-26 NOTE — Progress Notes (Signed)
  Echocardiogram Echocardiogram Transesophageal has been performed.  Milda Smart 11/26/2021, 10:46 AM

## 2021-11-26 NOTE — Transfer of Care (Signed)
Immediate Anesthesia Transfer of Care Note  Patient: Leah Rose  Procedure(s) Performed: TRANSESOPHAGEAL ECHOCARDIOGRAM (TEE) BUBBLE STUDY  Patient Location: PACU  Anesthesia Type:MAC  Level of Consciousness: awake, drowsy and patient cooperative  Airway & Oxygen Therapy: Patient Spontanous Breathing and Patient connected to nasal cannula oxygen  Post-op Assessment: Report given to RN, Post -op Vital signs reviewed and stable and Patient moving all extremities X 4  Post vital signs: Reviewed and stable  Last Vitals:  Vitals Value Taken Time  BP 94/72 11/26/21 1010  Temp 36.2 C 11/26/21 1010  Pulse 88 11/26/21 1011  Resp 17 11/26/21 1011  SpO2 88 % 11/26/21 1011  Vitals shown include unvalidated device data.  Last Pain:  Vitals:   11/26/21 1010  TempSrc:   PainSc: 0-No pain      Patients Stated Pain Goal: 3 (84/16/60 6301)  Complications: No notable events documented.

## 2021-11-26 NOTE — TOC Benefit Eligibility Note (Signed)
Patient Product/process development scientist completed.    The patient is currently admitted and upon discharge could be taking Linezolid 600mg .  The current 30 day co-pay is $75.00.   The patient is insured through  [employer], CPhT Pharmacy Patient Advocate Specialist [facility] Patient Advocate Team Direct Number: [phone]  Fax: [phone]

## 2021-11-26 NOTE — Anesthesia Preprocedure Evaluation (Signed)
Anesthesia Evaluation  Patient identified by MRN, date of birth, ID band Patient awake    Reviewed: Allergy & Precautions, NPO status , Patient's Chart, lab work & pertinent test results  Airway Mallampati: II       Dental   Pulmonary Current Smoker,    breath sounds clear to auscultation       Cardiovascular hypertension,  Rhythm:Regular Rate:Normal     Neuro/Psych PSYCHIATRIC DISORDERS TIA   GI/Hepatic negative GI ROS, Neg liver ROS,   Endo/Other  diabetesHypothyroidism   Renal/GU negative Renal ROS     Musculoskeletal   Abdominal   Peds  Hematology   Anesthesia Other Findings   Reproductive/Obstetrics                             Anesthesia Physical Anesthesia Plan  ASA: 3  Anesthesia Plan: General   Post-op Pain Management:    Induction: Intravenous  PONV Risk Score and Plan: 2 and Ondansetron and Propofol infusion  Airway Management Planned: Nasal Cannula and Simple Face Mask  Additional Equipment:   Intra-op Plan:   Post-operative Plan:   Informed Consent: I have reviewed the patients History and Physical, chart, labs and discussed the procedure including the risks, benefits and alternatives for the proposed anesthesia with the patient or authorized representative who has indicated his/her understanding and acceptance.     Dental advisory given  Plan Discussed with: CRNA and Anesthesiologist  Anesthesia Plan Comments:         Anesthesia Quick Evaluation

## 2021-11-26 NOTE — Interval H&P Note (Signed)
History and Physical Interval Note:  11/26/2021 9:25 AM  Leah Rose  has presented today for surgery, with the diagnosis of BACTERIMIA.  The various methods of treatment have been discussed with the patient and family. After consideration of risks, benefits and other options for treatment, the patient has consented to  Procedure(s): TRANSESOPHAGEAL ECHOCARDIOGRAM (TEE) (N/A) as a surgical intervention.  The patient's history has been reviewed, patient examined, no change in status, stable for surgery.  I have reviewed the patient's chart and labs.  Questions were answered to the patient's satisfaction.     Dietrich Pates

## 2021-11-27 ENCOUNTER — Other Ambulatory Visit (HOSPITAL_COMMUNITY): Payer: Self-pay

## 2021-11-27 DIAGNOSIS — I959 Hypotension, unspecified: Secondary | ICD-10-CM

## 2021-11-27 DIAGNOSIS — A4102 Sepsis due to Methicillin resistant Staphylococcus aureus: Secondary | ICD-10-CM | POA: Diagnosis not present

## 2021-11-27 DIAGNOSIS — E871 Hypo-osmolality and hyponatremia: Secondary | ICD-10-CM

## 2021-11-27 DIAGNOSIS — B9620 Unspecified Escherichia coli [E. coli] as the cause of diseases classified elsewhere: Secondary | ICD-10-CM | POA: Diagnosis not present

## 2021-11-27 DIAGNOSIS — M5459 Other low back pain: Secondary | ICD-10-CM

## 2021-11-27 DIAGNOSIS — N390 Urinary tract infection, site not specified: Secondary | ICD-10-CM | POA: Diagnosis not present

## 2021-11-27 LAB — BASIC METABOLIC PANEL
Anion gap: 9 (ref 5–15)
BUN: <5 mg/dL — ABNORMAL LOW (ref 6–20)
CO2: 20 mmol/L — ABNORMAL LOW (ref 22–32)
Calcium: 9 mg/dL (ref 8.9–10.3)
Chloride: 103 mmol/L (ref 98–111)
Creatinine, Ser: 0.52 mg/dL (ref 0.44–1.00)
GFR, Estimated: >60 mL/min (ref >60)
Glucose, Bld: 157 mg/dL — ABNORMAL HIGH (ref 70–99)
Potassium: 3.9 mmol/L (ref 3.5–5.1)
Sodium: 132 mmol/L — ABNORMAL LOW (ref 135–145)

## 2021-11-27 LAB — T4, FREE: Free T4: 0.99 ng/dL (ref 0.61–1.12)

## 2021-11-27 LAB — GLUCOSE, CAPILLARY
Glucose-Capillary: 139 mg/dL — ABNORMAL HIGH (ref 70–99)
Glucose-Capillary: 131 mg/dL — ABNORMAL HIGH (ref 70–99)
Glucose-Capillary: 128 mg/dL — ABNORMAL HIGH (ref 70–99)
Glucose-Capillary: 132 mg/dL — ABNORMAL HIGH (ref 70–99)

## 2021-11-27 LAB — CBC
HCT: 34.1 % — ABNORMAL LOW (ref 36.0–46.0)
Hemoglobin: 11.4 g/dL — ABNORMAL LOW (ref 12.0–15.0)
MCH: 25.9 pg — ABNORMAL LOW (ref 26.0–34.0)
MCHC: 33.4 g/dL (ref 30.0–36.0)
MCV: 77.5 fL — ABNORMAL LOW (ref 80.0–100.0)
Platelets: 484 10*3/uL — ABNORMAL HIGH (ref 150–400)
RBC: 4.4 MIL/uL (ref 3.87–5.11)
RDW: 14.4 % (ref 11.5–15.5)
WBC: 11.5 10*3/uL — ABNORMAL HIGH (ref 4.0–10.5)
nRBC: 0 % (ref 0.0–0.2)

## 2021-11-27 LAB — CULTURE, BLOOD (ROUTINE X 2)
Culture: NO GROWTH
Culture: NO GROWTH
Special Requests: ADEQUATE

## 2021-11-27 MED ORDER — SODIUM CHLORIDE 0.9 % IV BOLUS
1000.0000 mL | Freq: Once | INTRAVENOUS | Status: AC
  Administered 2021-11-27: 1000 mL via INTRAVENOUS

## 2021-11-27 NOTE — Progress Notes (Signed)
NAME:  Leah Rose, MRN:  [id number], DOB:  12-18-1970, LOS: 6 ADMISSION DATE:  11/20/2021  Subjective  Patient evaluated at bedside this AM.Family present. Nothing changed since yesterday, still asymptomatic and feeling better other than her pain. She says it is improved with percocet she got in the ED. She was frustrated that she urinated in her bed while hooked up to the IV pole. She feels safe to ambulate without dizziness or weakness, just needs help getting disconnected.   Objective   Blood pressure 123/72, pulse 79, temperature 98.6 F (37 C), temperature source Oral, resp. rate 17, height 4\' 11"  (1.499 m), weight 45.8 kg, SpO2 100 %.    No intake or output data in the 24 hours ending 11/27/21 1251  Filed Weights   11/21/21 0901  Weight: 45.8 kg   Physical Exam: Grossly unchanged since prior days General: Laying in bed, pleasant,no distress, non-toxic HEENT: Normocephalic, atraumatic, no scleral icterus CV: Regular rate, rhythm, no m/r/g Pulm: lungs clear to auscultation bilaterally, good airflow bilaterally Abdomen: Normoactive bowel sounds. Soft, non-tender, non-distended, no palpable masses. No CVA tenderness.  Skin: warm and dry MSK: No CVAT. Neuro: Awake, alert, oriented  Labs       Latest Ref Rng & Units 11/27/2021    5:37 AM 11/26/2021    5:05 AM 11/25/2021    3:52 AM  CBC  WBC 4.0 - 10.5 K/uL 11.5  12.6  14.0   Hemoglobin 12.0 - 15.0 g/dL 11/27/2021  22.9  79.8   Hematocrit 36.0 - 46.0 % 34.1  35.8  36.9   Platelets 150 - 400 K/uL 484  435  370       Latest Ref Rng & Units 11/27/2021    5:37 AM 11/26/2021    5:05 AM 11/25/2021    3:52 AM  BMP  Glucose 70 - 99 mg/dL 11/27/2021  194  174   BUN 6 - 20 mg/dL 5  5  7    Creatinine 0.44 - 1.00 mg/dL 081  4.48  1.85   Sodium 135 - 145 mmol/L 132  133  130   Potassium 3.5 - 5.1 mmol/L 3.9  3.5  3.8   Chloride 98 - 111 mmol/L 103  99  96   CO2 22 - 32 mmol/L 20  23  22    Calcium 8.9 - 10.3 mg/dL 9.0  9.0  9.1    Summary    Leah Rose is 51yo with tobacco use disorder, chronic venous insufficiency, hypertension, TIA admitted 8/19 for cephalosporin allergic reaction and found to have MRSA bacteremia, improving clinically but requiring continued care for IV antibiotics.  Assessment & Plan:  Principal Problem:   Sepsis secondary to UTI (HCC)  Uncomplicated MRSA bacteremia  Afebrile, vital signs are stable with improving wbc 12.6 to 11.5. Repeat blood cultures from 8/20 final result with no growth at 5 days.  Per ID will need to complete 14 total days of MRSA treatment from the date of culture since this is uncomplicated bacteremia, and can be done so on oral linezolid after discharge. -IV vanc(day 7/14), transition to oral linezolid at discharge - PRN tylenol for fever and pain -ID following  E coli urinary tract infection The patient was found to have E coli UTI with no CVA tenderness and no intraabdominal infection on CT A/P non-contrast at admission. Repeat CT AP w contrast 8/23 without evidence of pyelo. She was initially treated with ciprofloxacin and was switched to cefazolin 8/22 per ID given resistant E coli  to cipro. The left sided pain seems to be secondary to muscle tightness and spasm of the hamstrings, gluteal muscles and paraspinal muscles of the left lumbosacral region. Do not think this is pyelo given negative image findings and no CVA tenderness, but will continue treating this as clinical pyelonephritis per ID. Will give one dose IV cefazolin prior do discharge Monday. -Continue IV cefazolin for 7 day total course (day 5/7)  Hypotension Hyponatremia Patient has persistently low blood pressures in the low 100s systolic, despite a past medical history of hypertension. She is on HCTZ and lisinopril at home. She has not been getting lisinopril and HCTZ was stopped today. Suspect this is the source of some hypotension and mild hyponatremia, especially given her poor PO fluid intake and dehydration.  Do not think her thyroid is causing low BP as fT4 was normal, though it was low in the past.Do wonder if she has adrenal insufficiency and will test for this in the AM as she was supposed to have this done a while ago. Will also bolus with IVF today. -trend BMP -F/U AM ACTH and cortisol  L lower back pain Patient says this was an acute onset lower back pain without trauma. No radiation down the leg coming from the back to suggest radiculopathy. CT A/P w contrast 8/23 without an identifyable cause on imaging.Lidocaine patch has still provided no relief. The patient endorses good response to heat. Given her tight hamstrings on that side am curious if this is muscle spasm. She has not had a BM since Monday which may be causing this internal L lower back pain as well.  -Senna 2 tablets daily -Mirilax 17g daily -Apply heat to SI joint/left lower back  Best practice:  DIET: CM IVF: na DVT PPX: lovenox BOWEL: na CODE: FULL FAM COM: na  Willette Cluster Internal Medicine Resident PGY-1 PAGER: [phone] 11/27/2021 12:51 PM  If after hours (below), please contact on-call pager: [phone] 5PM-7AM Monday-Friday 1PM-7AM Saturday-Sunday

## 2021-11-27 NOTE — Progress Notes (Signed)
Occupational Therapy Treatment Patient Details Name: Leah Rose MRN: [id number] DOB: Dec 06, 1970 Today's Date: 11/27/2021   History of present illness Pt is 51 yo female presented with persistent L flank pain on 11/20/21.  Pt found to have MRSA bacteremia/Ecoli UTI with sepsis.  Pt with hx including chronic venous insufficiency, HTN, TIA   OT comments  Pt. Seen for skilled OT treatment session.  Pt. Agreeable to oob for toileting task.  Declined dme states she does not use rw at home.  Heavy reliance on furniture walking but no lob noted.  Min guard a mostly with cues for safety and sequencing.  Education on uses of 3n1 for home use. Cont. To benefit from acute OT with focus on progression and safety with pt. Completion of adls.     Recommendations for follow up therapy are one component of a multi-disciplinary discharge planning process, led by the attending physician.  Recommendations may be updated based on patient status, additional functional criteria and insurance authorization.    Follow Up Recommendations  Home health OT    Assistance Recommended at Discharge Intermittent Supervision/Assistance  Patient can return home with the following  A little help with walking and/or transfers;A little help with bathing/dressing/bathroom;Assistance with cooking/housework;Assist for transportation;Help with stairs or ramp for entrance   Equipment Recommendations  BSC/3in1;Tub/shower seat    Recommendations for Other Services      Precautions / Restrictions Precautions Precautions: Fall       Mobility Bed Mobility Overal bed mobility: Needs Assistance Bed Mobility: Supine to Sit, Sit to Supine     Supine to sit: Supervision Sit to supine: Supervision   General bed mobility comments: safety    Transfers Overall transfer level: Needs assistance Equipment used: None Transfers: Sit to/from Stand, Bed to chair/wheelchair/BSC Sit to Stand: Min guard Stand pivot transfers: Min  guard         General transfer comment: pt. declined rw states she does not use one at home. heavy reliance on furniture walking educated on which things are not safe to hold onto     Balance                                           ADL either performed or assessed with clinical judgement   ADL Overall ADL's : Needs assistance/impaired     Grooming: Wash/dry hands;Min Production designer, theatre/television/film: Min guard;Ambulation;BSC/3in1;Regular Toilet;Grab bars Toilet Transfer Details (indicate cue type and reason): 3n1 over the commode-educated on 3 uses of a 3n1 for home Toileting- Clothing Manipulation and Hygiene: Set up;Sitting/lateral lean;Sit to/from stand;Min guard       Functional mobility during ADLs: Min guard;Minimal assistance;Cueing for safety;Cueing for sequencing General ADL Comments: pt. utilized furniture walking during ambulation to/from b.room educated on uses of 3n1 for home    Extremity/Trunk Assessment              Vision       Perception     Praxis      Cognition Arousal/Alertness: Awake/alert Behavior During Therapy: WFL for tasks assessed/performed Overall Cognitive Status: History of cognitive impairments - at baseline  Exercises      Shoulder Instructions       General Comments      Pertinent Vitals/ Pain       Pain Assessment Pain Assessment: Faces Faces Pain Scale: Hurts a little bit Pain Location: did not state location Pain Intervention(s): Patient requesting pain meds-RN notified, Repositioned, Monitored during session, Limited activity within patient's tolerance  Home Living                                          Prior Functioning/Environment              Frequency  Min 2X/week        Progress Toward Goals  OT Goals(current goals can now be found in the care plan section)  Progress  towards OT goals: Progressing toward goals     Plan Discharge plan remains appropriate    Co-evaluation                 AM-PAC OT "6 Clicks" Daily Activity     Outcome Measure   Help from another person eating meals?: None Help from another person taking care of personal grooming?: A Little Help from another person toileting, which includes using toliet, bedpan, or urinal?: A Little Help from another person bathing (including washing, rinsing, drying)?: A Little Help from another person to put on and taking off regular upper body clothing?: None Help from another person to put on and taking off regular lower body clothing?: A Little 6 Click Score: 20    End of Session Equipment Utilized During Treatment: Gait belt  OT Visit Diagnosis: Unsteadiness on feet (R26.81);Other abnormalities of gait and mobility (R26.89);Muscle weakness (generalized) (M62.81);Dizziness and giddiness (R42)   Activity Tolerance Patient tolerated treatment well   Patient Left in bed;with call bell/phone within reach;with bed alarm set;with family/visitor present   Nurse Communication Patient requests pain meds        Time: 2130-8657 OT Time Calculation (min): 13 min  Charges: OT General Charges $OT Visit: 1 Visit OT Treatments $Self Care/Home Management : 8-22 mins  Boneta Lucks, COTA/L Acute Rehabilitation [phone]   Salvadore Oxford 11/27/2021, 12:53 PM

## 2021-11-28 DIAGNOSIS — R7881 Bacteremia: Secondary | ICD-10-CM

## 2021-11-28 DIAGNOSIS — M545 Low back pain, unspecified: Secondary | ICD-10-CM

## 2021-11-28 LAB — CBC
HCT: 33.1 % — ABNORMAL LOW (ref 36.0–46.0)
Hemoglobin: 11.1 g/dL — ABNORMAL LOW (ref 12.0–15.0)
MCH: 25.8 pg — ABNORMAL LOW (ref 26.0–34.0)
MCHC: 33.5 g/dL (ref 30.0–36.0)
MCV: 77 fL — ABNORMAL LOW (ref 80.0–100.0)
Platelets: 574 10*3/uL — ABNORMAL HIGH (ref 150–400)
RBC: 4.3 MIL/uL (ref 3.87–5.11)
RDW: 14.4 % (ref 11.5–15.5)
WBC: 10.6 10*3/uL — ABNORMAL HIGH (ref 4.0–10.5)
nRBC: 0 % (ref 0.0–0.2)

## 2021-11-28 LAB — BASIC METABOLIC PANEL
Anion gap: 6 (ref 5–15)
BUN: <5 mg/dL — ABNORMAL LOW (ref 6–20)
CO2: 22 mmol/L (ref 22–32)
Calcium: 8.9 mg/dL (ref 8.9–10.3)
Chloride: 107 mmol/L (ref 98–111)
Creatinine, Ser: 0.48 mg/dL (ref 0.44–1.00)
GFR, Estimated: >60 mL/min (ref >60)
Glucose, Bld: 122 mg/dL — ABNORMAL HIGH (ref 70–99)
Potassium: 3.8 mmol/L (ref 3.5–5.1)
Sodium: 135 mmol/L (ref 135–145)

## 2021-11-28 LAB — GLUCOSE, CAPILLARY
Glucose-Capillary: 128 mg/dL — ABNORMAL HIGH (ref 70–99)
Glucose-Capillary: 91 mg/dL (ref 70–99)

## 2021-11-28 LAB — CORTISOL-AM, BLOOD: Cortisol - AM: 14.2 ug/dL (ref 6.7–22.6)

## 2021-11-28 NOTE — Progress Notes (Cosign Needed Addendum)
NAME:  Leah Rose, MRN:  [id number], DOB:  Aug 27, 1970, LOS: 7 ADMISSION DATE:  11/20/2021  Subjective  Patient evaluated at bedside this AM. She states she is feeling better and is having bowel movements after being constipated. Discussed plan for discharge later tomorrow.   Objective   Blood pressure (!) 142/97, pulse 97, temperature 98.5 F (36.9 C), temperature source Oral, resp. rate 18, height 4\' 11"  (1.499 m), weight 45.8 kg, SpO2 100 %.     Intake/Output Summary (Last 24 hours) at 11/28/2021 1748 Last data filed at 11/27/2021 2240 Gross per 24 hour  Intake 150 ml  Output --  Net 150 ml   Filed Weights   11/21/21 0901  Weight: 45.8 kg   Physical Exam: General: Resting comfortably in the bed, no acute distress CV: Regular rate, rhythm. No murmurs appreciated. Warm extremities. Pulm: Normal work of breathing on room air. No audible wheezing. Abdomen: Soft, mildly tender on LLQ, non-distended. Normoactive bowel sounds. Neuro: Awake, alert, conversing appropriately. Grossly non-focal. Psych: Normal mood, affect, speech.   Labs       Latest Ref Rng & Units 11/28/2021    6:36 AM 11/27/2021    5:37 AM 11/26/2021    5:05 AM  CBC  WBC 4.0 - 10.5 K/uL 10.6  11.5  12.6   Hemoglobin 12.0 - 15.0 g/dL 11/28/2021  66.2  94.7   Hematocrit 36.0 - 46.0 % 33.1  34.1  35.8   Platelets 150 - 400 K/uL 574  484  435       Latest Ref Rng & Units 11/28/2021    6:36 AM 11/27/2021    5:37 AM 11/26/2021    5:05 AM  BMP  Glucose 70 - 99 mg/dL 11/28/2021  650  354   BUN 6 - 20 mg/dL <5  <5  5   Creatinine 0.44 - 1.00 mg/dL 656  8.12  7.51   Sodium 135 - 145 mmol/L 135  132  133   Potassium 3.5 - 5.1 mmol/L 3.8  3.9  3.5   Chloride 98 - 111 mmol/L 107  103  99   CO2 22 - 32 mmol/L 22  20  23    Calcium 8.9 - 10.3 mg/dL 8.9  9.0  9.0    Summary   Leah Rose is 51yo person with tobacco use disorder, chronic venous insufficiency, hypertension, TIA admitted 8/19 for sepsis 2/2 urinary tract  infection after suffering allergic reaction from cephalosporin, incidentally found to have MRSA bacteremia without clear source, improving on IV antibiotics.   Assessment & Plan:  Principal Problem:   Sepsis secondary to UTI [facility]) Active Problems:   MRSA bacteremia   Lower back pain  #Urinary tract infection 2/2 E. coli, sepsis resolved We are empirically treating for pyelonephritis per infectious disease's recommendations. No new symptoms for patient, will continue with IV medication, last dose will be tomorrow. - Continue IV cefazolin 7d (day 6/7)  #Uncomplicated MRSA bacteremia Continues to be afebrile and hemodynamically stable. Repeat cultures negative. No clear source of infection. Planning for discharge tomorrow morning. Per infectious disease will plan for a total of 14d antibiotic coverage - today is day 8 of MRSA coverage.  - Continue IV vancomycin today - Transition to linezolid tomorrow, last day of abx 12/04/2021  #Back pain Ms. Megna reports her pain has been well-controlled on oxycodone. This morning she was concerned regarding pain control after leaving the hospital. Explained that we can give her a short course (5d total), but after that  will need to wean off of any narcotic medication.  - Continue oxycodone 5mg  q6h PRN - Can give 5d course at discharge  - Continue daily senna  #Hypotension, resolved Blood pressure has improved now that we have stopped her home hydrochlorothiazide. AM cortisol today negative. Will continue to monitor. Would consider beta blocker for any other antihypertension needs.  Best practice:  DIET: regular IVF: na DVT PPX: lovenox BOWEL: senna CODE: FULL FAM COM: na  , MD Internal Medicine Resident PGY-3 PAGER: [phone] 11/28/2021 5:48 PM  If after hours (below), please contact on-call pager: [phone] 5PM-7AM Monday-Friday 1PM-7AM Saturday-Sunday

## 2021-11-29 ENCOUNTER — Encounter (HOSPITAL_COMMUNITY): Payer: Self-pay | Admitting: Internal Medicine

## 2021-11-29 ENCOUNTER — Other Ambulatory Visit (HOSPITAL_COMMUNITY): Payer: Self-pay

## 2021-11-29 DIAGNOSIS — N3000 Acute cystitis without hematuria: Secondary | ICD-10-CM

## 2021-11-29 LAB — VANCOMYCIN, PEAK: Vancomycin Pk: 14 ug/mL — ABNORMAL LOW (ref 30–40)

## 2021-11-29 LAB — URINE CULTURE: Culture: >=100000 — AB

## 2021-11-29 LAB — GLUCOSE, CAPILLARY: Glucose-Capillary: 106 mg/dL — ABNORMAL HIGH (ref 70–99)

## 2021-11-29 MED ORDER — OXYCODONE HCL 5 MG PO TABS
5.0000 mg | ORAL_TABLET | Freq: Three times a day (TID) | ORAL | 0 refills | Status: AC | PRN
  Filled 2021-11-29: qty 15, 5d supply, fill #0

## 2021-11-29 MED ORDER — ACETAMINOPHEN 500 MG PO TABS
1000.0000 mg | ORAL_TABLET | Freq: Three times a day (TID) | ORAL | 0 refills | Status: AC
  Filled 2021-11-29: qty 60, 10d supply, fill #0

## 2021-11-29 MED ORDER — DULOXETINE HCL 30 MG PO CPEP: 30.0000 mg | ORAL_CAPSULE | Freq: Every day | ORAL | 0 refills | Status: DC

## 2021-11-29 NOTE — Progress Notes (Signed)
Patient left hospital without retrieving home medications from pharmacy. Sister Juliette Alcide has agreed to pick up medications tomorrow.

## 2021-11-29 NOTE — Progress Notes (Signed)
Patient reporting she has no ride home and cannot use the bus. CSW provided taxi voucher on hard chart.   Joaquin Courts, MSW, [facility]

## 2021-11-29 NOTE — Discharge Summary (Addendum)
Name: Leah Rose MRN: [id number] DOB: 10/14/1970 51 y.o. PCP: Leah Contes, MD  Date of Admission: 11/20/2021 12:19 PM Date of Discharge: 11/29/2021 Attending Physician: Leah Mussel, MD  Discharge Diagnosis: 1. Principal Problem:   Sepsis secondary to UTI [facility]) Active Problems:   MRSA bacteremia   Lower back pain  MRSA Bacteremia E coli UTI-resolved Acute atraumatic left lower back pain  Discharge Medications: Allergies as of 11/29/2021       Reactions   Keflex [cephalexin] Hives, Itching, Swelling   Penicillins Itching   Sulfonamide Derivatives Itching, Rash        Medication List     STOP taking these medications    cephALEXin 500 MG capsule Commonly known as: KEFLEX   hydrochlorothiazide 12.5 MG tablet Commonly known as: HYDRODIURIL   HYDROcodone-acetaminophen 5-325 MG tablet Commonly known as: NORCO/VICODIN   olmesartan 40 MG tablet Commonly known as: BENICAR       TAKE these medications    acetaminophen 500 MG tablet Commonly known as: TYLENOL Take 2 tablets (1,000 mg total) by mouth 3 (three) times daily for 30 doses.   aspirin EC 81 MG tablet Take 81 mg by mouth daily. Swallow whole.   DULoxetine 30 MG capsule Commonly known as: Cymbalta Take 1 capsule (30 mg total) by mouth daily. What changed: See the new instructions.   linezolid 600 MG tablet Commonly known as: ZYVOX Take 1 tablet (600 mg total) by mouth 2 (two) times daily for 7 days.   medroxyPROGESTERone 150 MG/ML injection Commonly known as: DEPO-PROVERA Inject 150 mg into the muscle every 3 (three) months.   oxyCODONE 5 MG immediate release tablet Commonly known as: Roxicodone Take 1 tablet (5 mg total) by mouth every 8 (eight) hours as needed for up to 5 days for severe pain.        Disposition and follow-up:   Leah Rose was discharged from Leah Rose.  At the hospital follow up visit please address:  1.    Resolving Uncomplicated MRSA bacteremia --Linezolid PO through 12/06/2021 -recheck CBC  Chronic HTN -Held lisinopril and HCTZ at discharge -consider CCB vs BB  Acute atraumatic left lower back pain -discharged with 5 day course of oxycodone, tylenol -recommended voltaren -given work note for limited weight bearing -continue to evaluate for transition to chronic back pain requiring imaging  Poor Dentition -will need to see dentist  Hypotension during hospitalization -normal free T4, normal morning ACTH  2.  Labs / imaging needed at time of follow-up: CBC  3.  Pending labs/ test needing follow-up: NA  Follow-up Appointments:  Internal medicine center 12/08/2021 8:45 AM-Leah Rose for Infectious Disease 12/07/2021 8:45 AM-Leah Rose Course by problem list: Leah Rose is 51yo with tobacco use disorder, chronic venous insufficiency, hypertension, TIA admitted 8/19 for cephalosporin allergic reaction while treating E coli UTI and found to have MRSA bacteremia.  MRSA bacteremia E. coli urinary tract infection This is a 51 year old female who presented for some swelling in her face after receiving treatment for suspected pyelonephritis with cephalexin.  In the emergency department she was noted to be febrile tachycardic and tachypnea, laboratory showed an elevated lactic acid at 2.6 and wbc up to 14.6K.  Therefore there was concern for sepsis with acute organ dysfunction secondary to pyelonephritis.Only 1 set of blood cultures initially drawn and positive for MRSA bacteremia in the setting of a recently ruptured boil on the R arm. The  patient was treated with IV vanc starting 8/19. TTE and TEE were negative.Repeat blood cultures were without growth. ID followed the patient throughout and given uncomplicated MRSA bacteremia recommend 14 day total MRSA coverage with IV vanc 8/20-8/27, and oral linezolid 8/28-12/04/2021. At discharge the  patients lactate cleared, WBC was 10.6K, the patient was afebrile, normotensive without tachypnea or tachycardia.   E coli urinary tract infection The patient was found to have UA compatible with E coli UTI with no CVA tenderness and no intraabdominal infection on CT A/P non-contrast at admission. This retrospectively appears to be asymptomatic bacteruria but was treated given how sick she was with an unclear etiology and new onset L flank pain with bacteruria. Given persistent left flank pain she had repeat CT A/P w contrast and there were no signs of intra-abdominal or A/P MSK infection.She was initially treated with ciprofloxacin and was switched to cefazolin per ID given cipro resistance. She did not have any reaction to cefazolin and this was completed as a 7 day course for clinical pyelonephritis per ID, although again per the medicine team this seemed consistent with asymptomatic bacteruria vs uncomplicated cystitis.    L flank pain Patient says this was an acute onset flank pain without trauma that coincided with feeling sick and presenting to the ED.  Some SI joint pain to palpation.No pain on hip movements. Extremely tight hamstring muscles.No radiation down the leg coming from the back to suggest radiculopathy. This may just be an acute pulled hamstring vs muscle spasms causing radiation into the pelvis/flank . This was coincidental and unrelated to bacteremia. Was given short course of narcotic pain meds at last ED visit and will continue short 5 day course at discharge.   Discharge Exam:   BP (!) 139/95 (BP Location: Left Arm)   Pulse 95   Temp 98.5 F (36.9 C) (Oral)   Resp 16   Ht 4\' 11"  (1.499 m)   Wt 45.8 kg   SpO2 97%   BMI 20.40 kg/m  Discharge exam:  Gen: Well appearing female, laying in bed, pleasant, HEENT: normocephalic, atraumatic, EOM intact, no scleral icterus, poor dentition CV: RRR, normal s1/s2. No m/r/g, + pulses, cap refil 2 seconds Pulm: LCTAB, good airflow  through out, normal work of breathing Abd: soft, ND,NT Skin: residual from prior boil without erythem/fluctuance/ inflammation or drainage,warm and dry, no LE edema Pertinent Labs, Studies, and Procedures:     Latest Ref Rng & Units 11/28/2021    6:36 AM 11/27/2021    5:37 AM 11/26/2021    5:05 AM  CBC  WBC 4.0 - 10.5 K/uL 10.6  11.5  12.6   Hemoglobin 12.0 - 15.0 g/dL 11/28/2021  63.0  16.0   Hematocrit 36.0 - 46.0 % 33.1  34.1  35.8   Platelets 150 - 400 K/uL 574  484  435        Latest Ref Rng & Units 11/28/2021    6:36 AM 11/27/2021    5:37 AM 11/26/2021    5:05 AM  BMP  Glucose 70 - 99 mg/dL 11/28/2021  323  557   BUN 6 - 20 mg/dL <5  <5  5   Creatinine 0.44 - 1.00 mg/dL 322  0.25  4.27   Sodium 135 - 145 mmol/L 135  132  133   Potassium 3.5 - 5.1 mmol/L 3.8  3.9  3.5   Chloride 98 - 111 mmol/L 107  103  99   CO2 22 - 32 mmol/L 22  20  23   Calcium 8.9 - 10.3 mg/dL 8.9  9.0  9.0    DG Chest Port 1 View  Result Date: 11/20/2021 CLINICAL DATA:  Questionable sepsis - evaluate for abnormality EXAM: PORTABLE CHEST 1 VIEW COMPARISON:  Chest radiograph 08/10/2016 FINDINGS: Mild cardiomegaly. Stable mediastinal contours. No pulmonary edema, focal airspace disease, pleural effusion, or pneumothorax. No acute osseous findings. IMPRESSION: Mild cardiomegaly. No acute chest findings. Electronically Signed   By: Narda Rutherford M.D.   On: 11/20/2021 16:39     Discharge Instructions: Discharge Instructions     Diet - low sodium heart healthy   Complete by: As directed    Discharge instructions   Complete by: As directed    You were hospitalized for a bacterial infection of your blood stream. The bacteria was MRSA and you were treated with IV antibiotics while in the hospital. You will take oral antibiotics after leaving the hospital to finish your treatment. You also had a urinary tract infection and were treated with IV antibiotics for this while in the hospital. Your blood pressure was low in the  hospital so we are stopping your blood pressure medications. You also have lower back pain which will need to be evaluated. We are discharging you with 5 days of pain medications. You can also find voltaren gel at any [facility], [employer] or pharmacy and rub it over the affected area.  MEDICATION CHANGES:  START: Linezolid 600 mg twice daily(2 pills) for 8 days (last day 12/06/2021) Oxycodone 5 mg 3 times daily(every 6 hours) Tylenol 1000mg  (2 pills) 3 times daily (every 6 hours)  STOP: HCTZ Olmesartan   Increase activity slowly   Complete by: As directed        Signed: , MD 11/29/2021, 1:03 PM   Pager: [phone]

## 2021-11-29 NOTE — Progress Notes (Signed)
Physical Therapy Treatment Patient Details Name: Leah Rose MRN: [id number] DOB: [birth year] Today's Date: 11/29/2021   History of Present Illness Pt is 51 yo female presented with persistent L flank pain on 11/20/21.  Pt found to have MRSA bacteremia/Ecoli UTI with sepsis.  Pt with hx including chronic venous insufficiency, HTN, TIA    PT Comments    Pt plans to d/c home today, expressed desire to practice gait prior to d/c. Pt ambulatory for short hallway distance without use of RW support due to PT encouragement, pt with slowed speed and short step length without use of AD. Pt states she plans to go back to work on Thursday of this week, and the job is mostly standing. Pt and PT discussed AD options, PT feels pt would benefit most from rollator given pt's limited tolerance and to have ability to sit and rest as needed at work. Pt also states she needs BSC, initially PT under the impression that pt had all needed DME but pt now stating she does not. PT placed DME orders.     Recommendations for follow up therapy are one component of a multi-disciplinary discharge planning process, led by the attending physician.  Recommendations may be updated based on patient status, additional functional criteria and insurance authorization.  Follow Up Recommendations  Home health PT     Assistance Recommended at Discharge Frequent or constant Supervision/Assistance  Patient can return home with the following Help with stairs or ramp for entrance;A little help with walking and/or transfers;Assistance with cooking/housework   Equipment Recommendations  Rollator (4 wheels);BSC/3in1    Recommendations for Other Services       Precautions / Restrictions Precautions Precautions: Fall Restrictions Weight Bearing Restrictions: No     Mobility  Bed Mobility Overal bed mobility: Needs Assistance             General bed mobility comments: up in room    Transfers                    General transfer comment: up in room    Ambulation/Gait Ambulation/Gait assistance: Supervision Gait Distance (Feet): 230 Feet Assistive device: Rolling walker (2 wheels), None Gait Pattern/deviations: Step-through pattern, Decreased stride length, Trunk flexed, Shuffle Gait velocity: decr     General Gait Details: cues for upright posture, increasing step length   Stairs             Wheelchair Mobility    Modified Rankin (Stroke Patients Only)       Balance Overall balance assessment: Needs assistance Sitting-balance support: Bilateral upper extremity supported Sitting balance-Leahy Scale: Fair     Standing balance support: No upper extremity supported, During functional activity Standing balance-Leahy Scale: Fair                              Cognition Arousal/Alertness: Awake/alert Behavior During Therapy: WFL for tasks assessed/performed Overall Cognitive Status: History of cognitive impairments - at baseline                                 General Comments: 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        Exercises      General Comments        Pertinent Vitals/Pain Pain Assessment Pain Assessment: Faces Faces Pain Scale: Hurts little more Pain Location: back Pain Descriptors / Indicators: Grimacing,  Guarding Pain Intervention(s): Limited activity within patient's tolerance, Monitored during session, Repositioned    Home Living                          Prior Function            PT Goals (current goals can now be found in the care plan section) Acute Rehab PT Goals Patient Stated Goal: decrease back pain PT Goal Formulation: With patient Time For Goal Achievement: 12/07/21 Potential to Achieve Goals: Good Progress towards PT goals: Progressing toward goals    Frequency    Min 3X/week      PT Plan Current plan remains appropriate;Equipment recommendations need to be updated     Co-evaluation              AM-PAC PT "6 Clicks" Mobility   Outcome Measure  Help needed turning from your back to your side while in a flat bed without using bedrails?: None Help needed moving from lying on your back to sitting on the side of a flat bed without using bedrails?: None Help needed moving to and from a bed to a chair (including a wheelchair)?: A Little Help needed standing up from a chair using your arms (e.g., wheelchair or bedside chair)?: None Help needed to walk in hospital room?: A Little Help needed climbing 3-5 steps with a railing? : A Little 6 Click Score: 21    End of Session   Activity Tolerance: Patient tolerated treatment well Patient left: in bed;with call bell/phone within reach;with family/visitor present Nurse Communication: Mobility status PT Visit Diagnosis: Unsteadiness on feet (R26.81);Other abnormalities of gait and mobility (R26.89);Pain Pain - Right/Left: Left Pain - part of body:  (back)     Time: 1350-1405 PT Time Calculation (min) (ACUTE ONLY): 15 min  Charges:  $Gait Training: 8-22 mins                     Marye Round, PT DPT Acute Rehabilitation Services Pager [phone]  Office [phone]    Tyrone Apple E Christain [place] 11/29/2021, 3:25 PM

## 2021-11-29 NOTE — TOC Transition Note (Signed)
Transition of Care [facility]) - CM/SW Discharge Note   Patient Details  Name: Leah Rose MRN: [id number] Date of Birth: [birth year]  Transition of [facility] CM/SW Contact:  Harriet Masson, RN Phone Number: 11/29/2021, 2:27 PM   Clinical Narrative:     Patient stable for discharge. Orders for home health. Patient defers to [facility] to find highly rated agency. Spoke to [employer] with [facility] accepted referral.  DME ordered. Patient agreeable to use in house provider. Spoke to [place] with [employer] and ordered 3&1, RW to be delivered to the home address. Family at bedside to transport home. Address, Phone number and PCP verified.   Final next level of care: Home w Hospice Care Barriers to Discharge: Barriers Resolved   Patient Goals and CMS Choice      Return home  Discharge Placement                   home    Discharge Plan and Services                DME Arranged: 3-N-1, Dan Humphreys DME Agency: [facility] Date DME Agency Contacted: 11/29/21 Time DME Agency Contacted: 1425 Representative spoke with at DME Agency: Lawernce Keas HH Arranged: PT HH Agency: [facility] Date [facility] Agency Contacted: 11/29/21 Time HH Agency Contacted: 1426 Representative spoke with at [facility] Agency: Elnita Maxwell  Social Determinants of Health (SDOH) Interventions     Readmission Risk Interventions    11/29/2021    2:27 PM  Readmission Risk Prevention Plan  Post Dischage Appt Complete  Medication Screening Complete  Transportation Screening Complete

## 2021-11-30 ENCOUNTER — Telehealth: Payer: Self-pay | Admitting: Emergency Medicine

## 2021-11-30 ENCOUNTER — Other Ambulatory Visit (HOSPITAL_COMMUNITY): Payer: Self-pay

## 2021-11-30 ENCOUNTER — Encounter (HOSPITAL_COMMUNITY): Payer: Self-pay | Admitting: Pharmacist

## 2021-11-30 LAB — URINE CULTURE: Culture: >=100000 — AB

## 2021-11-30 NOTE — Telephone Encounter (Signed)
Post ED Visit - Positive Culture Follow-up  Culture report reviewed by antimicrobial stewardship pharmacist: [facility] []  , Pharm.D. []  Enzo Bi, Pharm.D., BCPS AQ-ID []  , Pharm.D., BCPS []  Celedonio Miyamoto, Pharm.D., BCPS []  [place], Garvin Fila.D., BCPS, AAHIVP []  , Pharm.D., BCPS, AAHIVP []  Georgina Pillion, PharmD, BCPS []  , PharmD, BCPS []  [place], PharmD, BCPS []  [place], PharmD []  , PharmD, BCPS []  Estella Husk, PharmD  Pharmacy Team []  Lysle Pearl, PharmD []  , PharmD []  Phillips Climes, PharmD []  , Rph []  Agapito Games) , PharmD []  Verlan Friends, PharmD []  , PharmD []  Mervyn Gay, PharmD []  , PharmD []  Vinnie Level, PharmD []  Wonda Olds, PharmD []  , PharmD []  Len Childs, PharmD PharmD   Positive urine culture Treated with cephalexin and ciprofloxacin, no further patient follow-up is required at this time.  Greer Pickerel 11/30/2021, 10:03 AM

## 2021-12-07 ENCOUNTER — Inpatient Hospital Stay: Payer: 59 | Admitting: Infectious Diseases

## 2021-12-08 ENCOUNTER — Ambulatory Visit: Payer: 59

## 2021-12-08 VITALS — BP 125/85 | HR 105 | Temp 98.7°F | Wt 95.8 lb

## 2021-12-08 DIAGNOSIS — M545 Low back pain, unspecified: Secondary | ICD-10-CM | POA: Diagnosis not present

## 2021-12-08 DIAGNOSIS — I10 Essential (primary) hypertension: Secondary | ICD-10-CM

## 2021-12-08 DIAGNOSIS — E038 Other specified hypothyroidism: Secondary | ICD-10-CM

## 2021-12-08 DIAGNOSIS — R7881 Bacteremia: Secondary | ICD-10-CM

## 2021-12-08 DIAGNOSIS — B9562 Methicillin resistant Staphylococcus aureus infection as the cause of diseases classified elsewhere: Secondary | ICD-10-CM

## 2021-12-08 DIAGNOSIS — F17210 Nicotine dependence, cigarettes, uncomplicated: Secondary | ICD-10-CM

## 2021-12-08 DIAGNOSIS — F331 Major depressive disorder, recurrent, moderate: Secondary | ICD-10-CM

## 2021-12-08 MED ORDER — OXYCODONE HCL 5 MG PO TABS: 5.0000 mg | ORAL_TABLET | Freq: Three times a day (TID) | ORAL | 0 refills | Status: DC | PRN

## 2021-12-08 MED ORDER — OXYCODONE HCL 5 MG PO TABS: 5.0000 mg | ORAL_TABLET | Freq: Three times a day (TID) | ORAL | 0 refills | Status: AC | PRN

## 2021-12-08 NOTE — Patient Instructions (Addendum)
Ms.Shanita Tessmer, it was a pleasure seeing you today!  Today we discussed: Hospital follow up: Reassured today by resolutions of symptoms and fevers after completing your antibiotic course. Please reschedule your follow up with infectious disease as soon as you can.  Low back pain: We will get labs to ensure you do not have any bony infections in the setting of your low back pain. Please come back in 2-3 weeks to discuss any persistent symptoms and/or need for more tests. I will prescribe 5 more days of oxycodone and more lidocaine patches. HTN: continue with your current medications. Your blood pressure was well controlled today.  I have ordered the following labs today:  Lab Orders         CBC no Diff         Sed Rate (ESR)         CRP (C-Reactive Protein)       Tests ordered today:  none  Referrals ordered today:   Referral Orders  No referral(s) requested today     I have ordered the following medication/changed the following medications:   Stop the following medications: Medications Discontinued During This Encounter  Medication Reason   oxyCODONE (ROXICODONE) 5 MG immediate release tablet Entry Error     Start the following medications: Meds ordered this encounter  Medications                  oxyCODONE (ROXICODONE) 5 MG immediate release tablet    Sig: Take 1 tablet (5 mg total) by mouth every 8 (eight) hours as needed for up to 5 days.    Dispense:  15 tablet    Refill:  0     Follow-up:  2-3 weeks    Please make sure to arrive 15 minutes prior to your next appointment. If you arrive late, you may be asked to reschedule.   We look forward to seeing you next time. Please call our clinic at [phone] if you have any questions or concerns. The best time to call is Monday-Friday from 9am-4pm, but there is someone available 24/7. If after hours or the weekend, call the main hospital number and ask for the Internal Medicine Resident On-Call. If you need  medication refills, please notify your pharmacy one week in advance and they will send [place] a request.  Thank you for letting us take part in your care. Wishing you the best!  Thank you, Sanjuan Dame, MD

## 2021-12-08 NOTE — Progress Notes (Signed)
CC: hospital follow up  HPI:  [name] is a 51 y.o. with history as below who presents for hospital follow-up after admission for MRSA bacteremia.  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  Physical Exam:  Vitals:   12/08/21 0834  BP: 125/85  Pulse: (!) 105  Temp: 98.7 F (37.1 C)  TempSrc: Oral  SpO2: 100%  Weight: 95 lb 12.8 oz (43.5 kg)   Physical Exam Constitutional:      General: She is not in acute distress. HENT:     Head: Normocephalic and atraumatic.  Eyes:     Extraocular Movements: Extraocular movements intact.  Cardiovascular:     Rate and Rhythm: Normal rate and regular rhythm.  Pulmonary:     Effort: Pulmonary effort is normal.     Breath sounds: Normal breath sounds. No wheezing, rhonchi or rales.  Abdominal:     General: There is no distension.  Musculoskeletal:     Comments: Mild tenderness to deep palpation at left calf and left lower back. No spinous process tenderness, lower extremity weakness or loss of sensation.   Skin:    General: Skin is warm and dry.  Neurological:     General: No focal deficit present.     Mental Status: She is alert and oriented to person, place, and time.     Sensory: No sensory deficit.     Motor: No weakness.  Psychiatric:        Mood and Affect: Mood normal.        Behavior: Behavior normal.      Assessment & Plan:   See Encounters Tab for problem based charting.  Essential hypertension Pressure at goal today at 125/85. Endorses medication compliance.   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 States fatigue is  currently not a problem but continues to endorse depressive symptoms. ACTH, Cortisol, LH levels wnl. Seems to be isolated central hypothyroidism.   -Will get MRI brain to rule out pituitary adenoma once/if patient is able  MRSA bacteremia Patient presents today after admission for MRSA bacteremia. She has finished her linezolid and denies any symptoms of infection (fevers, chills). Denies any urinary symptoms. Vitals within normal limits today. Tachycardia resolved on manual recheck.  -Missed 9/5 ID follow up. Encouraged to reschedule. -Repeat CBC  Lower back pain Patient reports 2 weeks of dull, achy left lower back pain with some pain extending to left posterior thigh and calf since midway into her admission for MRSA bacteremia. Worse with standing for long period and bending over. This limits her ability to carry out job function. She is afebrile today and WBC elevation/lactate were wnl at discharge. Topical agents including lidocaine patch, voltaren gel, and tiger balm have only provided temporary relief. Oxy 5 mg q8 prn does provide relief but she cannot take while working, when symptoms are most profound. Symptoms are ok in the morning and worsen over the course of the day but do not affect sleep. No symptoms of sciatica. Denies paraesthesias. No weakness or sensory deficits on exam. No spinous process tenderness. Given recent hospitalization for bacteremia, will get labs including CBC, ESR, CRP. MRI spine also appropriate but patient doesn't  feel like she can do this right now due to cost/logistics. Afebrile without red flag symptoms. If not infectious (osteomyelitis), would expect acute atraumatic low back pain to resolve with supportive care. She is not amenable to PT at this time.  -CBC, ESR, CRP -F/u in 2 weeks -Oxycodone 5 mg q8 hr prn for 5 days -Discussed obtaining lidocaine patch OTC -Continue with Tylenol, heat, rest as needed  Moderate recurrent major depression ([place]) Patient  refused PSQ9 today. States depressive symptoms are not present currently but does get tearful later in visit when discussing living situation and pain management. Would likely benefit from anti-depressant but has self-discontinued Zoloft and Cymbalta in the past due to feeling "weird" and "off." Duloxetine would potentially be of most benefit if acute low back pain turns chronic.   -Revisit depressive symptoms at f/u -Consider referral for therapy if amenable. Previous therapist (Dr. Theodis Shove) no longer practicing.    Patient seen with Dr. Evette Doffing

## 2021-12-08 NOTE — Assessment & Plan Note (Addendum)
Patient reports 2 weeks of dull, achy left lower back pain with some pain extending to left posterior thigh and calf since midway into her admission for MRSA bacteremia. Worse with standing for long period and bending over. This limits her ability to carry out job function. She is afebrile today and WBC elevation/lactate were wnl at discharge. Topical agents including lidocaine patch, voltaren gel, and tiger balm have only provided temporary relief. Oxy 5 mg q8 prn does provide relief but she cannot take while working, when symptoms are most profound. Symptoms are ok in the morning and worsen over the course of the day but do not affect sleep. No symptoms of sciatica. Denies paraesthesias. No weakness or sensory deficits on exam. No spinous process tenderness. Given recent hospitalization for bacteremia, will get labs including CBC, ESR, CRP. MRI spine also appropriate but patient doesn't feel like she can do this right now due to cost/logistics. Afebrile without red flag symptoms. If not infectious (osteomyelitis), would expect acute atraumatic low back pain to resolve with supportive care. She is not amenable to PT at this time.  -CBC, ESR, CRP -F/u in 2 weeks -Oxycodone 5 mg q8 hr prn for 5 days -Discussed obtaining lidocaine patch OTC -Continue with Tylenol, heat, rest as needed

## 2021-12-08 NOTE — Assessment & Plan Note (Signed)
Prior labs consistent with central hypothyroidism. States fatigue is currently not a problem but continues to endorse depressive symptoms. ACTH, Cortisol, LH levels wnl. Seems to be isolated central hypothyroidism.   -Will get MRI brain to rule out pituitary adenoma once/if patient is able

## 2021-12-08 NOTE — Assessment & Plan Note (Signed)
Patient refused PSQ9 today. States depressive symptoms are not present currently but does get tearful later in visit when discussing living situation and pain management. Would likely benefit from anti-depressant but has self-discontinued Zoloft and Cymbalta in the past due to feeling "weird" and "off." Duloxetine would potentially be of most benefit if acute low back pain turns chronic.   -Revisit depressive symptoms at f/u -Consider referral for therapy if amenable. Previous therapist (Dr. Monna Fam) no longer practicing.

## 2021-12-08 NOTE — Assessment & Plan Note (Addendum)
Patient presents today after admission for MRSA bacteremia. She has finished her linezolid and denies any symptoms of infection (fevers, chills). Denies any urinary symptoms. Vitals within normal limits today. Tachycardia resolved on manual recheck.  -Missed 9/5 ID follow up. Encouraged to reschedule. -Repeat CBC

## 2021-12-08 NOTE — Assessment & Plan Note (Signed)
Pressure at goal today at 125/85. Endorses medication compliance.   -Continue HCTZ 12.5 daily, olmesartan 40 mg daily

## 2021-12-09 LAB — CBC
Hematocrit: 37.5 % (ref 34.0–46.6)
Hemoglobin: 11.5 g/dL (ref 11.1–15.9)
MCH: 25.1 pg — ABNORMAL LOW (ref 26.6–33.0)
MCHC: 30.7 g/dL — ABNORMAL LOW (ref 31.5–35.7)
MCV: 82 fL (ref 79–97)
Platelets: 444 10*3/uL (ref 150–450)
RBC: 4.58 x10E6/uL (ref 3.77–5.28)
RDW: 13.6 % (ref 11.7–15.4)
WBC: 8.3 10*3/uL (ref 3.4–10.8)

## 2021-12-09 LAB — C-REACTIVE PROTEIN: CRP: 1 mg/L (ref 0–10)

## 2021-12-09 LAB — SEDIMENTATION RATE: Sed Rate: 25 mm/hr (ref 0–40)

## 2021-12-09 NOTE — Progress Notes (Signed)
Internal Medicine Clinic Attending  I saw and evaluated the patient.  I personally confirmed the key portions of the history and exam documented by Dr. Cliffton Asters and I reviewed pertinent patient test results.  The assessment, diagnosis, and plan were formulated together and I agree with the documentation in the resident's note.   Significant function limiting low back pain that is new since hospitalization for MRSA bacteremia. She has no other symptoms to suggest the infection is still active, she finished antibiotics. Inflammatory markers are totally normal, so seems low risk to be complicated discitis or osteomyelitis. We talked about supportive care for now, patient declined PT, duloxetine, or imaging. We are going to try just 5 more days of pain medications, she understands that this is not a long term treatment.

## 2022-02-17 ENCOUNTER — Observation Stay (HOSPITAL_COMMUNITY): Payer: 59

## 2022-02-17 ENCOUNTER — Observation Stay (HOSPITAL_COMMUNITY)
Admission: EM | Admit: 2022-02-17 | Discharge: 2022-02-18 | Disposition: A | Discharge status: Home / Self Care | Payer: 59 | Attending: Internal Medicine | Admitting: Internal Medicine

## 2022-02-17 ENCOUNTER — Other Ambulatory Visit: Payer: Self-pay

## 2022-02-17 ENCOUNTER — Emergency Department (HOSPITAL_COMMUNITY): Payer: 59

## 2022-02-17 DIAGNOSIS — Z7982 Long term (current) use of aspirin: Secondary | ICD-10-CM | POA: Insufficient documentation

## 2022-02-17 DIAGNOSIS — M25552 Pain in left hip: Secondary | ICD-10-CM | POA: Diagnosis present

## 2022-02-17 DIAGNOSIS — K5900 Constipation, unspecified: Secondary | ICD-10-CM | POA: Insufficient documentation

## 2022-02-17 DIAGNOSIS — F17200 Nicotine dependence, unspecified, uncomplicated: Secondary | ICD-10-CM | POA: Insufficient documentation

## 2022-02-17 DIAGNOSIS — E119 Type 2 diabetes mellitus without complications: Secondary | ICD-10-CM | POA: Diagnosis not present

## 2022-02-17 DIAGNOSIS — Z8673 Personal history of transient ischemic attack (TIA), and cerebral infarction without residual deficits: Secondary | ICD-10-CM | POA: Insufficient documentation

## 2022-02-17 DIAGNOSIS — N390 Urinary tract infection, site not specified: Principal | ICD-10-CM | POA: Diagnosis present

## 2022-02-17 DIAGNOSIS — I10 Essential (primary) hypertension: Secondary | ICD-10-CM | POA: Diagnosis not present

## 2022-02-17 DIAGNOSIS — B9620 Unspecified Escherichia coli [E. coli] as the cause of diseases classified elsewhere: Secondary | ICD-10-CM | POA: Insufficient documentation

## 2022-02-17 DIAGNOSIS — N12 Tubulo-interstitial nephritis, not specified as acute or chronic: Secondary | ICD-10-CM | POA: Diagnosis not present

## 2022-02-17 DIAGNOSIS — Z79899 Other long term (current) drug therapy: Secondary | ICD-10-CM | POA: Diagnosis not present

## 2022-02-17 DIAGNOSIS — E46 Unspecified protein-calorie malnutrition: Secondary | ICD-10-CM | POA: Insufficient documentation

## 2022-02-17 DIAGNOSIS — R109 Unspecified abdominal pain: Secondary | ICD-10-CM

## 2022-02-17 LAB — CBC WITH DIFFERENTIAL/PLATELET
Abs Immature Granulocytes: 0.09 10*3/uL — ABNORMAL HIGH (ref 0.00–0.07)
Basophils Absolute: 0.1 10*3/uL (ref 0.0–0.1)
Basophils Relative: 1 %
Eosinophils Absolute: 0.3 10*3/uL (ref 0.0–0.5)
Eosinophils Relative: 2 %
HCT: 41.4 % (ref 36.0–46.0)
Hemoglobin: 13 g/dL (ref 12.0–15.0)
Immature Granulocytes: 1 %
Lymphocytes Relative: 18 %
Lymphs Abs: 2.3 10*3/uL (ref 0.7–4.0)
MCH: 25.4 pg — ABNORMAL LOW (ref 26.0–34.0)
MCHC: 31.4 g/dL (ref 30.0–36.0)
MCV: 80.9 fL (ref 80.0–100.0)
Monocytes Absolute: 1.2 10*3/uL — ABNORMAL HIGH (ref 0.1–1.0)
Monocytes Relative: 10 %
Neutro Abs: 8.5 10*3/uL — ABNORMAL HIGH (ref 1.7–7.7)
Neutrophils Relative %: 68 %
Platelets: 328 10*3/uL (ref 150–400)
RBC: 5.12 MIL/uL — ABNORMAL HIGH (ref 3.87–5.11)
RDW: 15 % (ref 11.5–15.5)
WBC: 12.4 10*3/uL — ABNORMAL HIGH (ref 4.0–10.5)
nRBC: 0 % (ref 0.0–0.2)

## 2022-02-17 LAB — BASIC METABOLIC PANEL
Anion gap: 13 (ref 5–15)
BUN: 6 mg/dL (ref 6–20)
CO2: 21 mmol/L — ABNORMAL LOW (ref 22–32)
Calcium: 9.5 mg/dL (ref 8.9–10.3)
Chloride: 105 mmol/L (ref 98–111)
Creatinine, Ser: 0.64 mg/dL (ref 0.44–1.00)
GFR, Estimated: >60 mL/min (ref >60)
Glucose, Bld: 98 mg/dL (ref 70–99)
Potassium: 3.7 mmol/L (ref 3.5–5.1)
Sodium: 139 mmol/L (ref 135–145)

## 2022-02-17 LAB — URINALYSIS, ROUTINE W REFLEX MICROSCOPIC
Bilirubin Urine: NEGATIVE
Glucose, UA: NEGATIVE mg/dL
Ketones, ur: NEGATIVE mg/dL
Nitrite: POSITIVE — AB
Protein, ur: NEGATIVE mg/dL
RBC / HPF: >50 RBC/hpf — ABNORMAL HIGH (ref 0–5)
Specific Gravity, Urine: 1.009 (ref 1.005–1.030)
pH: 6 (ref 5.0–8.0)

## 2022-02-17 MED ORDER — NICOTINE 21 MG/24HR TD PT24
21.0000 mg | MEDICATED_PATCH | Freq: Every day | TRANSDERMAL | Status: DC
  Administered 2022-02-17 – 2022-02-18 (×2): 21 mg via TRANSDERMAL
  Filled 2022-02-17 (×2): qty 1

## 2022-02-17 MED ORDER — SODIUM CHLORIDE 0.9 % IV SOLN
1.0000 g | INTRAVENOUS | Status: DC
  Administered 2022-02-18: 1 g via INTRAVENOUS
  Filled 2022-02-17: qty 10

## 2022-02-17 MED ORDER — DIPHENHYDRAMINE HCL 50 MG/ML IJ SOLN
25.0000 mg | Freq: Once | INTRAMUSCULAR | Status: AC
  Administered 2022-02-17: 25 mg via INTRAVENOUS
  Filled 2022-02-17: qty 1

## 2022-02-17 MED ORDER — ASPIRIN 81 MG PO TBEC
81.0000 mg | DELAYED_RELEASE_TABLET | Freq: Every day | ORAL | Status: DC
  Administered 2022-02-17 – 2022-02-18 (×2): 81 mg via ORAL
  Filled 2022-02-17 (×2): qty 1

## 2022-02-17 MED ORDER — ONDANSETRON HCL 4 MG/2ML IJ SOLN
4.0000 mg | Freq: Once | INTRAMUSCULAR | Status: AC
  Administered 2022-02-17: 4 mg via INTRAVENOUS
  Filled 2022-02-17: qty 2

## 2022-02-17 MED ORDER — SENNOSIDES-DOCUSATE SODIUM 8.6-50 MG PO TABS: 2.0000 | ORAL_TABLET | Freq: Every day | ORAL | Status: DC

## 2022-02-17 MED ORDER — SODIUM CHLORIDE 0.9 % IV SOLN: 1.0000 g | Freq: Once | INTRAVENOUS | Status: DC

## 2022-02-17 MED ORDER — POLYETHYLENE GLYCOL 3350 17 G PO PACK
17.0000 g | PACK | Freq: Every day | ORAL | Status: DC
  Filled 2022-02-17: qty 1

## 2022-02-17 MED ORDER — SODIUM CHLORIDE 0.9 % IV BOLUS
1000.0000 mL | Freq: Once | INTRAVENOUS | Status: AC
  Administered 2022-02-17: 1000 mL via INTRAVENOUS

## 2022-02-17 MED ORDER — MORPHINE SULFATE (PF) 4 MG/ML IV SOLN
4.0000 mg | Freq: Once | INTRAVENOUS | Status: AC
  Administered 2022-02-17: 4 mg via INTRAVENOUS
  Filled 2022-02-17: qty 1

## 2022-02-17 MED ORDER — IRBESARTAN 300 MG PO TABS
300.0000 mg | ORAL_TABLET | Freq: Every day | ORAL | Status: DC
  Administered 2022-02-18: 300 mg via ORAL
  Filled 2022-02-17: qty 1

## 2022-02-17 MED ORDER — IOHEXOL 350 MG/ML SOLN
75.0000 mL | Freq: Once | INTRAVENOUS | Status: AC | PRN
  Administered 2022-02-17: 75 mL via INTRAVENOUS

## 2022-02-17 MED ORDER — HYDROCHLOROTHIAZIDE 12.5 MG PO TABS
12.5000 mg | ORAL_TABLET | Freq: Every day | ORAL | Status: DC
  Administered 2022-02-17 – 2022-02-18 (×2): 12.5 mg via ORAL
  Filled 2022-02-17 (×2): qty 1

## 2022-02-17 MED ORDER — ENOXAPARIN SODIUM 30 MG/0.3ML IJ SOSY
30.0000 mg | PREFILLED_SYRINGE | INTRAMUSCULAR | Status: DC
  Administered 2022-02-17 – 2022-02-18 (×2): 30 mg via SUBCUTANEOUS
  Filled 2022-02-17 (×2): qty 0.3

## 2022-02-17 MED ORDER — HYDROMORPHONE HCL 1 MG/ML IJ SOLN
0.5000 mg | Freq: Once | INTRAMUSCULAR | Status: AC
  Administered 2022-02-17: 0.5 mg via INTRAVENOUS
  Filled 2022-02-17: qty 1

## 2022-02-17 MED ORDER — ACETAMINOPHEN 325 MG PO TABS
650.0000 mg | ORAL_TABLET | Freq: Four times a day (QID) | ORAL | Status: DC | PRN
  Administered 2022-02-17 – 2022-02-18 (×2): 650 mg via ORAL
  Filled 2022-02-17 (×2): qty 2

## 2022-02-17 MED ORDER — POLYETHYLENE GLYCOL 3350 17 G PO PACK
17.0000 g | PACK | Freq: Every day | ORAL | Status: DC
  Administered 2022-02-18: 17 g via ORAL
  Filled 2022-02-17: qty 1

## 2022-02-17 MED ORDER — ROSUVASTATIN CALCIUM 20 MG PO TABS
20.0000 mg | ORAL_TABLET | Freq: Every day | ORAL | Status: DC
  Administered 2022-02-17 – 2022-02-18 (×2): 20 mg via ORAL
  Filled 2022-02-17 (×2): qty 1

## 2022-02-17 MED ORDER — SODIUM CHLORIDE 0.9 % IV SOLN
1.0000 g | Freq: Once | INTRAVENOUS | Status: AC
  Administered 2022-02-17: 1 g via INTRAVENOUS
  Filled 2022-02-17: qty 10

## 2022-02-17 MED ORDER — SENNOSIDES-DOCUSATE SODIUM 8.6-50 MG PO TABS: 1.0000 | ORAL_TABLET | Freq: Every day | ORAL | Status: DC

## 2022-02-17 MED ORDER — ACETAMINOPHEN 650 MG RE SUPP: 650.0000 mg | Freq: Four times a day (QID) | RECTAL | Status: DC | PRN

## 2022-02-17 MED ORDER — SODIUM CHLORIDE 0.9 % IV SOLN: Freq: Once | INTRAVENOUS | Status: AC

## 2022-02-17 MED ORDER — ONDANSETRON 4 MG PO TBDP
4.0000 mg | ORAL_TABLET | Freq: Once | ORAL | Status: AC
  Administered 2022-02-17: 4 mg via ORAL
  Filled 2022-02-17: qty 1

## 2022-02-17 MED ORDER — OXYCODONE HCL 5 MG PO TABS
5.0000 mg | ORAL_TABLET | Freq: Four times a day (QID) | ORAL | Status: DC | PRN
  Administered 2022-02-17 – 2022-02-18 (×4): 5 mg via ORAL
  Filled 2022-02-17 (×4): qty 1

## 2022-02-17 MED ORDER — OXYCODONE-ACETAMINOPHEN 5-325 MG PO TABS
1.0000 | ORAL_TABLET | Freq: Once | ORAL | Status: AC
  Administered 2022-02-17: 1 via ORAL
  Filled 2022-02-17: qty 1

## 2022-02-17 NOTE — ED Notes (Signed)
Pt refused tylenol

## 2022-02-17 NOTE — ED Triage Notes (Signed)
Pt BIB [facility] from home d/t new onset of left sided hip pain with radiation to the left flank area. Pain associated with urinary frequency. Similar symptoms during last hospital admission sepsis s/t uti.

## 2022-02-17 NOTE — ED Notes (Signed)
Pt resting quietly with eyes closed

## 2022-02-17 NOTE — ED Notes (Signed)
Pt yelling in hall "nothing you are doing is working", "I need to go to the bathroom and no one is helping me", explained to pt that this nurse will get a wheelchair to help her to the bathroom. Pt continues to yell that I am not doing anything for her. Pt sleeping 15 mins ago after 2nd dose of pain medication. No ready for participation in PO trial. This nurse assisted pt to bathroom via WC and returned. Pt continues to moan and yell in hallway.

## 2022-02-17 NOTE — ED Notes (Signed)
Pt to CT at this time.

## 2022-02-17 NOTE — ED Notes (Signed)
Patient assisted to and from bathroom via wheelchair.

## 2022-02-17 NOTE — ED Notes (Signed)
Refuses water. Requesting orange juice. Provided.

## 2022-02-17 NOTE — ED Notes (Signed)
Pt in a hall bed. Miralax not given at this time.

## 2022-02-17 NOTE — ED Provider Triage Note (Signed)
Emergency Medicine Provider Triage Evaluation Note  Leah Rose , a 51 y.o. female  was evaluated in triage.  Pt complains of left-sided flank pain started today, came on suddenly, remains in her left lower back, does not radiate, worsened with movements improved with certain positioning, no paresthesias or weakness moving down her legsaddle paresthesias no urinary bowel incontinency's, no recent trauma, does endorse frequent urination without dysuria hematuria, states she has felt this when she was diagnosed with pyelonephritis,.  Review of Systems  Positive: Flank pain, urinary frequency Negative: Hematuria, fever  Physical Exam  BP 121/87 (BP Location: Right Arm)   Pulse 100   Temp 98.5 F (36.9 C) (Oral)   Resp 18   Ht 4\' 11"  (1.499 m)   Wt 43.5 kg   SpO2 100%   BMI 19.35 kg/m  Gen:   Awake, no distress   Resp:  Normal effort  MSK:   Moves extremities without difficulty  Other:    Medical Decision Making  Medically screening exam initiated at 2:44 AM.  Appropriate orders placed.  Ardeth Repetto was informed that the remainder of the evaluation will be completed by another provider, this initial triage assessment does not replace that evaluation, and the importance of remaining in the ED until their evaluation is complete.  Lab work imaging ordered will need further work-up.   Allena Napoleon, PA-C 02/17/22 0246

## 2022-02-17 NOTE — H&P (Signed)
Date: 02/17/2022               Patient Name:  Leah Rose MRN: [id number]  DOB: Jun 19, 1970 Age / Sex: 51 y.o., female   PCP: Earl Lagos, MD         Medical Service: Internal Medicine Teaching Service         Attending Physician: Dr. Earl Lagos, MD    First Contact: Dr. Leroy Kennedy Pager: [phone]  Second Contact: Dr. Elza Rafter Pager: [phone]       After Hours (hypertension, hyperlipidemia, prior TIA, after 5p/  First Contact Pager: [phone]  weekends / holidays): Second Contact Pager: [phone]   Chief Complaint: Left flank pain  History of Present Illness:  Leah Rose is a 51 year old person living with history of hypertension, hyperlipidemia, prior TIA, diet-controlled diabetes, admission August for MRSA bacteremia and UTI who presents today for 1 day of acute flank pain. Pain radiates towards her left groin. Patient states pain is a crampy pain that suddenly started while she was at work.  She went to bed and was woken up by this pain around 2 AM.  Since then pain has continued to be persistent without much relief with pain medications in the ED.  She also endorses urinary frequency but no dysuria.  She denies any recent trauma or changes in her activity.  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  She denies fevers, chills, chest pain, shortness of breath, nausea, vomiting, abdominal pain, dysuria, syncope.  Meds:  Duloxetine 30 mg daily Medroxyprogestrone  HCTZ 12.5 mg daily  Omesartan 40 mg daily Rosuvastatin20 mg daily  Allergies: Allergies as of 02/17/2022 - Review Complete 02/17/2022  Allergen Reaction Noted   Ibuprofen Swelling 02/17/2022   Keflex [cephalexin] Hives, Itching, and Swelling 11/20/2021   Penicillins Itching    Sulfonamide derivatives Itching and Rash    Past Medical History:  Diagnosis Date   Chronic venous insufficiency 06/23/2017   Essential hypertension 03/14/2006   Hypertensive emergency  07/15/2020   Hypokalemia 07/15/2020   Moderate recurrent major depression (HCC) 03/14/2006   Tobacco abuse 03/14/2006    Family History: alcoholic cirrhosis(mother)  Social History: Lives alone, independent of ADLs and iADLs, works as a Geophysicist/field seismologist. Has 1 beer every other day. Smoking 1/2 ppd for the past few years. Vapes occasionally, denies other illicit drug use.   Review of Systems: A complete ROS was negative except as per HPI.   Physical Exam: Blood pressure (!) 150/106, pulse (!) 101, temperature 98.5 F (36.9 C), resp. rate 16, height 4\' 11"  (1.499 m), weight 43.5 kg, SpO2 98 %. Constitutional: Tearful, unable to sit still in bed HENT: Normocephalic and atraumatic, EOMI, conjunctiva normal, moist mucous membranes Cardiovascular: Normal rate, regular rhythm, S1 and S2 present, no murmurs, rubs, gallops.  Distal pulses intact Respiratory: No respiratory distress, no accessory muscle use.  Effort is normal.  Lungs are clear to auscultation bilaterally. GI: Nondistended, soft, nontender to palpation, normal active bowel sounds. No CVA tenderness Musculoskeletal: mild TTP of the left lower lumbar area Neurological: Is alert and oriented x4, no apparent focal deficits noted. Skin: Warm and dry.  No rash, erythema, lesions noted.   CT Renal Stone Study  Result Date: 02/17/2022 CLINICAL DATA:  Flank pain. EXAM: CT ABDOMEN AND PELVIS WITHOUT CONTRAST TECHNIQUE: Multidetector CT imaging of the abdomen and pelvis was performed following the standard protocol without IV contrast. RADIATION DOSE REDUCTION: This exam  was performed according to the departmental dose-optimization program which includes automated exposure control, adjustment of the mA and/or kV according to patient size and/or use of iterative reconstruction technique. COMPARISON:  CT abdomen pelvis dated 11/24/2021. FINDINGS: Evaluation of this exam is limited in the absence of intravenous contrast. Lower chest: The visualized lung  bases are clear. No intra-abdominal free air or free fluid. Hepatobiliary: The liver is unremarkable. No biliary ductal dilatation. The gallbladder is unremarkable Pancreas: Unremarkable. No pancreatic ductal dilatation or surrounding inflammatory changes. Spleen: Normal in size without focal abnormality. Adrenals/Urinary Tract: The adrenal glands are unremarkable. There is no hydronephrosis or nephrolithiasis on either side. The visualized ureters and urinary bladder appear unremarkable. Stomach/Bowel: There is moderate stool throughout the no bowel obstruction or active inflammation. The appendix is normal. Vascular/Lymphatic: Mild aortoiliac atherosclerotic disease. The IVC is unremarkable. No portal venous gas. There is no adenopathy. Reproductive: The uterus is poorly visualized.  No adnexal masses. Other: None Musculoskeletal: Lower lumbar facet arthropathy. No acute osseous pathology. IMPRESSION: 1. No acute intra-abdominal or pelvic pathology. No hydronephrosis or nephrolithiasis. 2. Moderate colonic stool burden. No bowel obstruction. Normal appendix. 3.  Aortic Atherosclerosis (ICD10-I70.0). Electronically Signed   By: Elgie Collard M.D.   On: 02/17/2022 03:49   CT L-SPINE NO CHARGE  Result Date: 02/17/2022 CLINICAL DATA:  Left flank pain EXAM: CT LUMBAR SPINE WITHOUT CONTRAST TECHNIQUE: Multidetector CT imaging of the lumbar spine was performed without intravenous contrast administration. Multiplanar CT image reconstructions were also generated. RADIATION DOSE REDUCTION: This exam was performed according to the departmental dose-optimization program which includes automated exposure control, adjustment of the mA and/or kV according to patient size and/or use of iterative reconstruction technique. COMPARISON:  None Available. 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 Paraspinal and other soft tissues: Negative. Disc levels: There is severe facet arthrosis at L4-5, left-greater-than-right. No spinal canal stenosis or nerve root impingement within the lumbar spine. IMPRESSION: 1. 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 2. 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 3. No spinal canal stenosis or nerve root impingement within the lumbar spine. Electronically Signed   By: Deatra Robinson M.D.   On: 02/17/2022 03:48     Assessment & Plan by Problem: Principal Problem:   UTI (urinary tract infection)  UTI Admission for MRSA and e. Coli UTI resistant to flouroquinolones in 11/2021. Endorses urinary frequency. UA with nitrate, leukocytes, RBC,and many bacteria. Given flank pain concern for pyelonephritis, however no fever, mild leukocytosis of 12.4. Does not appear septic. CT stone study negative. May have had an reaction to keflex in the past but tolerated cefazolin at last admission. -Rocephin 1g daily - Follow up urine culture - CT abdomen to check for pyelonephritis - F/u UA  Constipation Stool burden on CT may be contributing to her left sided pain. - bowel regimen.  Hypertension HLD Prior TIA - continue aspirin - BP well controlled on home olmesartan and HCTZ - continue rosuvastatin  T2DM Diet controlled. A1c 5.7 on 06/23/2021. - outpatient diabetes follow up  Depression - Not taking home duloxetine - Will need evaluation for continue depression symptoms  Dispo: Admit patient to Observation with expected length of stay less than 2 midnights.  Signed: Quincy Simmonds, MD 02/17/2022, 3:14 PM  Pager: [phone] After 5pm on weekdays and 1pm on weekends: On Call pager: [phone]

## 2022-02-17 NOTE — ED Notes (Signed)
Pt requesting pain medication. Apple juice provided.

## 2022-02-17 NOTE — ED Notes (Signed)
MD at bedside for evaluation

## 2022-02-17 NOTE — ED Provider Notes (Signed)
[facility] EMERGENCY DEPARTMENT Provider Note   CSN: [id number] Arrival date & time: 02/17/22  0225     History  Chief Complaint  Patient presents with   Hip Pain    Leah Rose is a 51 y.o. female.  51 year old female with prior medical history as detailed who presents for evaluation.  Patient with complaint of left flank and left-sided hip pain.  Patient reports that pain is gotten worse over the last 24 hours.  She denies fevers.  She reports increased pain with any attempted movement or attempted standing.  Patient with prior history of likely pyelonephritis treated with hospitalization approximately 2 months ago.  Patient denies paresthesias or weakness in her legs.  She denies recent trauma.  She denies difficulty with urinary or bowel incontinence.  She does report increased frequency of urination.  The history is provided by the patient and medical records.       Home Medications Prior to Admission medications   Medication Sig Start Date End Date Taking? Authorizing Provider  aspirin EC 81 MG tablet Take 81 mg by mouth daily. Swallow whole.    [provider]  DULoxetine (CYMBALTA) 30 MG capsule Take 1 capsule (30 mg total) by mouth daily. 11/29/21   Steffanie Rainwater, MD  medroxyPROGESTERone (DEPO-PROVERA) 150 MG/ML injection Inject 150 mg into the muscle every 3 (three) months. 11/18/21   [provider]      Allergies    Ibuprofen, Keflex [cephalexin], Penicillins, and Sulfonamide derivatives    Review of Systems   Review of Systems  All other systems reviewed and are negative.   Physical Exam Updated Vital Signs BP 128/77   Pulse 97   Temp 98.5 F (36.9 C)   Resp 16   Ht 4\' 11"  (1.499 m)   Wt 43.5 kg   SpO2 97%   BMI 19.35 kg/m  Physical Exam Vitals and nursing note reviewed.  Constitutional:      General: She is not in acute distress.    Appearance: Normal appearance. She is well-developed.  HENT:      Head: Normocephalic and atraumatic.  Eyes:     Conjunctiva/sclera: Conjunctivae normal.     Pupils: Pupils are equal, round, and reactive to light.  Cardiovascular:     Rate and Rhythm: Normal rate and regular rhythm.     Heart sounds: Normal heart sounds.  Pulmonary:     Effort: Pulmonary effort is normal. No respiratory distress.     Breath sounds: Normal breath sounds.  Abdominal:     General: There is no distension.     Palpations: Abdomen is soft.     Tenderness: There is no abdominal tenderness.     Comments: Diffuse tenderness elicited with any attempted palpation of the left flank.  Musculoskeletal:        General: No deformity. Normal range of motion.     Cervical back: Normal range of motion and neck supple.  Skin:    General: Skin is warm and dry.  Neurological:     General: No focal deficit present.     Mental Status: She is alert and oriented to person, place, and time.     ED Results / Procedures / Treatments   Labs (all labs ordered are listed, but only abnormal results are displayed) Labs Reviewed  CBC WITH DIFFERENTIAL/PLATELET - Abnormal; Notable for the following components:      Result Value   WBC 12.4 (*)    RBC 5.12 (*)  MCH 25.4 (*)    Neutro Abs 8.5 (*)    Monocytes Absolute 1.2 (*)    Abs Immature Granulocytes 0.09 (*)    All other components within normal limits  BASIC METABOLIC PANEL - Abnormal; Notable for the following components:   CO2 21 (*)    All other components within normal limits  URINALYSIS, ROUTINE W REFLEX MICROSCOPIC - Abnormal; Notable for the following components:   APPearance CLOUDY (*)    Hgb urine dipstick SMALL (*)    Nitrite POSITIVE (*)    Leukocytes,Ua LARGE (*)    RBC / HPF >50 (*)    Bacteria, UA MANY (*)    All other components within normal limits    EKG None  Radiology CT Renal Stone Study  Result Date: 02/17/2022 CLINICAL DATA:  Flank pain. EXAM: CT ABDOMEN AND PELVIS WITHOUT CONTRAST TECHNIQUE:  Multidetector CT imaging of the abdomen and pelvis was performed following the standard protocol without IV contrast. RADIATION DOSE REDUCTION: This exam was performed according to the departmental dose-optimization program which includes automated exposure control, adjustment of the mA and/or kV according to patient size and/or use of iterative reconstruction technique. COMPARISON:  CT abdomen pelvis dated 11/24/2021. FINDINGS: Evaluation of this exam is limited in the absence of intravenous contrast. Lower chest: The visualized lung bases are clear. No intra-abdominal free air or free fluid. Hepatobiliary: The liver is unremarkable. No biliary ductal dilatation. The gallbladder is unremarkable Pancreas: Unremarkable. No pancreatic ductal dilatation or surrounding inflammatory changes. Spleen: Normal in size without focal abnormality. Adrenals/Urinary Tract: The adrenal glands are unremarkable. There is no hydronephrosis or nephrolithiasis on either side. The visualized ureters and urinary bladder appear unremarkable. Stomach/Bowel: There is moderate stool throughout the no bowel obstruction or active inflammation. The appendix is normal. Vascular/Lymphatic: Mild aortoiliac atherosclerotic disease. The IVC is unremarkable. No portal venous gas. There is no adenopathy. Reproductive: The uterus is poorly visualized.  No adnexal masses. Other: None Musculoskeletal: Lower lumbar facet arthropathy. No acute osseous pathology. IMPRESSION: 1. No acute intra-abdominal or pelvic pathology. No hydronephrosis or nephrolithiasis. 2. Moderate colonic stool burden. No bowel obstruction. Normal appendix. 3.  Aortic Atherosclerosis (ICD10-I70.0). Electronically Signed   By: Elgie Collard M.D.   On: 02/17/2022 03:49   CT L-SPINE NO CHARGE  Result Date: 02/17/2022 CLINICAL DATA:  Left flank pain EXAM: CT LUMBAR SPINE WITHOUT CONTRAST TECHNIQUE: Multidetector CT imaging of the lumbar spine was performed without intravenous  contrast administration. Multiplanar CT image reconstructions were also generated. RADIATION DOSE REDUCTION: This exam was performed according to the departmental dose-optimization program which includes automated exposure control, adjustment of the mA and/or kV according to patient size and/or use of iterative reconstruction technique. COMPARISON:  None Available. 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 Paraspinal and other soft tissues: Negative. Disc levels: There is severe facet arthrosis at L4-5, left-greater-than-right. No spinal canal stenosis or nerve root impingement within the lumbar spine. IMPRESSION: 1. 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 2. 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 3. No spinal canal stenosis or nerve root impingement within the lumbar spine. Electronically Signed   By: Deatra Robinson M.D.   On: 02/17/2022 03:48    Procedures Procedures    Medications Ordered in ED Medications  oxyCODONE-acetaminophen (PERCOCET/ROXICET) 5-325 MG per tablet 1 tablet (1 tablet Oral Given 02/17/22 0302)  ondansetron (ZOFRAN-ODT) disintegrating tablet 4 mg (4 mg Oral Given 02/17/22 0302)  sodium chloride 0.9 % bolus 1,000 mL (1,000 mLs Intravenous New Bag/Given 02/17/22 0839)  morphine (PF) 4 MG/ML injection 4 mg (4 mg Intravenous Given 02/17/22 0840)  cefTRIAXone (ROCEPHIN) 1 g in sodium chloride 0.9 % 100 mL IVPB (0 g Intravenous Stopped 02/17/22 0916)  ondansetron (ZOFRAN) injection 4 mg (4 mg Intravenous Given 02/17/22 0839)  HYDROmorphone (DILAUDID) injection 0.5 mg (0.5 mg Intravenous Given 02/17/22 0913)    ED Course/ Medical Decision Making/ A&P                           Medical Decision Making Amount and/or Complexity of Data Reviewed Labs:  ordered. Radiology: ordered.  Risk Prescription drug management. Decision regarding hospitalization.    Medical Screen Complete  This patient presented to the ED with complaint of left flank pain.  This complaint involves an extensive number of treatment options. The initial differential diagnosis includes, but is not limited to, renal colic, pyelonephritis, traumatic injury, metabolic abnormality, etc.  This presentation is: Acute, Self-Limited, Previously Undiagnosed, Uncertain Prognosis, Complicated, Systemic Symptoms, and Threat to Life/Bodily Function  She is presenting with significant left-sided flank pain.  Work-up is suggestive of possible pyelonephritis.  Patient with apparent history of same.  CT imaging without evidence of hydro, ureteral obstruction, renal stone.  Patient with minimal improvement in pain with administration of narcotics here in the ED.  Antibiotics initiated for treatment of suspected pyelonephritis.  Patient would benefit from admission for further work-up and treatment.  Additional history obtained:  External records from outside sources obtained and reviewed including prior ED visits and prior Inpatient records.    Lab Tests:  I ordered and personally interpreted labs.  The pertinent results include: CBC, BMP, UA,   Imaging Studies ordered:  I ordered imaging studies including CT renal study, CT L-spine I independently visualized and interpreted obtained imaging which showed NAD I agree with the radiologist interpretation.   Cardiac Monitoring:  The patient was maintained on a cardiac monitor.  I personally viewed and interpreted the cardiac monitor which showed an underlying rhythm of: NSR   Medicines ordered:  I ordered medication including morphine, Dilaudid, Rocephin for pain, suspected pyelonephritis Reevaluation of the patient after these medicines showed that the patient: improved  Problem List / ED Course:  Suspected  pyelonephritis, left flank pain   Reevaluation:  After the interventions noted above, I reevaluated the patient and found that they have: improved  Disposition:  After consideration of the diagnostic results and the patients response to treatment, I feel that the patent would benefit from admission.          Final Clinical Impression(s) / ED Diagnoses Final diagnoses:  Flank pain  Pyelonephritis    Rx / DC Orders ED Discharge Orders     None         Wynetta Fines, MD 02/17/22 1237

## 2022-02-17 NOTE — ED Notes (Signed)
Provider at bedside. Pt talking calmly with provider.

## 2022-02-18 ENCOUNTER — Encounter (HOSPITAL_COMMUNITY): Payer: Self-pay | Admitting: Internal Medicine

## 2022-02-18 DIAGNOSIS — N390 Urinary tract infection, site not specified: Secondary | ICD-10-CM | POA: Diagnosis not present

## 2022-02-18 LAB — CBC
HCT: 38.4 % (ref 36.0–46.0)
Hemoglobin: 12 g/dL (ref 12.0–15.0)
MCH: 25.4 pg — ABNORMAL LOW (ref 26.0–34.0)
MCHC: 31.3 g/dL (ref 30.0–36.0)
MCV: 81.4 fL (ref 80.0–100.0)
Platelets: 299 10*3/uL (ref 150–400)
RBC: 4.72 MIL/uL (ref 3.87–5.11)
RDW: 14.9 % (ref 11.5–15.5)
WBC: 12.7 10*3/uL — ABNORMAL HIGH (ref 4.0–10.5)
nRBC: 0 % (ref 0.0–0.2)

## 2022-02-18 LAB — MRSA NEXT GEN BY PCR, NASAL: MRSA by PCR Next Gen: NOT DETECTED

## 2022-02-18 LAB — BASIC METABOLIC PANEL
Anion gap: 10 (ref 5–15)
BUN: 6 mg/dL (ref 6–20)
CO2: 22 mmol/L (ref 22–32)
Calcium: 9 mg/dL (ref 8.9–10.3)
Chloride: 102 mmol/L (ref 98–111)
Creatinine, Ser: 0.73 mg/dL (ref 0.44–1.00)
GFR, Estimated: >60 mL/min (ref >60)
Glucose, Bld: 166 mg/dL — ABNORMAL HIGH (ref 70–99)
Potassium: 3.8 mmol/L (ref 3.5–5.1)
Sodium: 134 mmol/L — ABNORMAL LOW (ref 135–145)

## 2022-02-18 MED ORDER — SENNOSIDES-DOCUSATE SODIUM 8.6-50 MG PO TABS: 2.0000 | ORAL_TABLET | Freq: Every day | ORAL | Status: DC

## 2022-02-18 MED ORDER — LIDOCAINE 5 % EX PTCH
1.0000 | MEDICATED_PATCH | Freq: Once | CUTANEOUS | Status: AC
  Administered 2022-02-18: 1 via TRANSDERMAL
  Filled 2022-02-18: qty 1

## 2022-02-18 MED ORDER — ENSURE COMPLETE SHAKE PO LIQD: 1.0000 | Freq: Three times a day (TID) | ORAL | 99 refills | Status: AC | PRN

## 2022-02-18 MED ORDER — ORAL CARE MOUTH RINSE: 15.0000 mL | OROMUCOSAL | Status: DC | PRN

## 2022-02-18 MED ORDER — POLYETHYLENE GLYCOL 3350 17 G PO PACK: 17.0000 g | PACK | Freq: Every day | ORAL | 0 refills | Status: AC

## 2022-02-18 MED ORDER — ROSUVASTATIN CALCIUM 20 MG PO TABS: 20.0000 mg | ORAL_TABLET | Freq: Every day | ORAL | 0 refills | Status: DC

## 2022-02-18 MED ORDER — CEFADROXIL 500 MG PO CAPS: 500.0000 mg | ORAL_CAPSULE | Freq: Two times a day (BID) | ORAL | 0 refills | Status: DC

## 2022-02-18 MED ORDER — POLYETHYLENE GLYCOL 3350 17 G PO PACK: 17.0000 g | PACK | Freq: Every day | ORAL | Status: DC

## 2022-02-18 MED ORDER — IRBESARTAN 300 MG PO TABS: 300.0000 mg | ORAL_TABLET | Freq: Every day | ORAL | 0 refills | Status: DC

## 2022-02-18 MED ORDER — HYDROCHLOROTHIAZIDE 12.5 MG PO TABS: 12.5000 mg | ORAL_TABLET | Freq: Every day | ORAL | 0 refills | Status: DC

## 2022-02-18 MED ORDER — CEFDINIR 300 MG PO CAPS: 300.0000 mg | ORAL_CAPSULE | Freq: Two times a day (BID) | ORAL | 0 refills | Status: AC

## 2022-02-18 MED ORDER — OXYCODONE HCL 5 MG PO TABS: 5.0000 mg | ORAL_TABLET | Freq: Four times a day (QID) | ORAL | 0 refills | Status: AC | PRN

## 2022-02-18 NOTE — Discharge Summary (Signed)
Name: Leah Rose MRN: [id number] DOB: [birth year] 51 y.o. PCP: Aldine Contes, MD  Date of Admission: 02/17/2022  2:25 AM Date of Discharge: 02/18/2022 Attending Physician: Dr. Dareen Piano  Discharge Diagnosis: Urinary tract infection Constipation Hypertension Hyperlipidemia Prior TIA T2DM Depression Malnutrition    Discharge Medications: Allergies as of 02/18/2022       Reactions   Ibuprofen Swelling   Keflex [cephalexin] Hives, Itching, Swelling   Tolerated Cefazolin 11/2021 after reaction to cephalexin    Penicillins Itching   Sulfonamide Derivatives Itching, Rash        Medication List     TAKE these medications    aspirin EC 81 MG tablet Take 81 mg by mouth daily. Swallow whole.   cefdinir 300 MG capsule Commonly known as: OMNICEF Take 1 capsule (300 mg total) by mouth 2 (two) times daily for 3 days. Start taking on: February 19, 2022   DULoxetine 30 MG capsule Commonly known as: Cymbalta Take 1 capsule (30 mg total) by mouth daily.   Ensure Complete Shake Liqd Take 1 each by mouth 3 (three) times daily as needed.   hydrochlorothiazide 12.5 MG tablet Commonly known as: HYDRODIURIL Take 1 tablet (12.5 mg total) by mouth daily. Start taking on: February 19, 2022   irbesartan 300 MG tablet Commonly known as: AVAPRO Take 1 tablet (300 mg total) by mouth daily. Start taking on: February 19, 2022   medroxyPROGESTERone 150 MG/ML injection Commonly known as: DEPO-PROVERA Inject 150 mg into the muscle every 3 (three) months.   oxyCODONE 5 MG immediate release tablet Commonly known as: Oxy IR/ROXICODONE Take 1 tablet (5 mg total) by mouth every 6 (six) hours as needed for up to 3 days for moderate pain.   polyethylene glycol 17 g packet Commonly known as: MIRALAX / GLYCOLAX Take 17 g by mouth daily. Start taking on: February 19, 2022   rosuvastatin 20 MG tablet Commonly known as: CRESTOR Take 1 tablet (20 mg total) by mouth daily. Start  taking on: February 19, 2022         Disposition and follow-up:   Leah Rose was discharged from [facility] in Good condition.  At the hospital follow up visit please address:  1.  Follow-up:  a. Resolution of abdominal pain and completion of antibiotics. Follow up urine cultures if need to change antibiotic.    b. Resolution of constipation and continuation of miralax as needed.  2.  Labs / imaging needed at time of follow-up: None  3.  Pending labs/ test needing follow-up: Urine Culture Sensitivities   4.  Medication Changes  Abx - Cefadroxil 500 mg bid End Date: 02/22/2022 Oxycodone 5 mg q6h prn for 3 days  Follow-up Appointments:  Follow-up Information     Johny Blamer, DO .   Contact information: [place] [address] [phone]                 Hospital Course by problem list: Leah Rose is a 51 y/o female with PMH of HTN, HLD, prior TIA, T2DM, and recent UTI with MRSA bacteremia who presents with acute left flank pain and suprapubic pain and is admitted for UTI.  Urinary tract infection-E. coli Patient presented with acute onset left flank pain with associated suprapubic pain without dysuria or fever.  She had recently had a UTI as well as MRSA bacteremia.  UA had large amount of leukocytes, positive for nitrites, many bacteria, and red blood cells.  Urine culture grew over  100,000 colonies of E. coli.  Due to her flank pain there was some concern for pyelonephritis however her CT abdomen pelvis showed no signs of renal involvement and CT stone protocol showed no evidence of stones.  The pain improved throughout her admission.  She remained afebrile and her white count was stable at 12 during admission.  She was started on ceftriaxone on admission and discharged on cefadroxil 500 mg twice daily for 3 more days to complete a total of 5 days of antibiotics.  She will follow-up in our clinic on 03/01/2022.  Return  precautions were discussed.  Constipation Patient has not had a bowel movement since she got to the hospital which is irregular for her as she usually goes every day.  CT abdomen pelvis did show a moderate stool burden but no obstruction. We started her on miralax and senna-docusate. She was discharged on miralax with return precautions.   Hypertension Hyperlipidemia Prior TIA We continued her home medications of aspirin, olmesartan, hydrochlorothiazide, and rosuvastatin.  T2DM Blood sugars here were very well controlled here without medication and her A1c was 5.7 in 06/2021.  Depression Patient has been prescribed duloxetine in the past but was apparently not taking this.  We will discuss this at hospital follow-up.  Malnutrition  Pt with a BMI of 20 and noted food insecurity. She has been on ensure in the past and would like to get those again. We will continue to work on her nutrition outpatient.    Discharge Subjective: Patient has improved overnight with decreasing abdominal pain.  She has still not had a bowel movement but has not had any worsening pain.  She notes improving urinary frequency that was present prior to admission.  She did better throughout the day and was ready to go home in the afternoon.  Denied any dysuria.  Discharge Exam:   BP 116/78 (BP Location: Left Arm)   Pulse (!) 108   Temp 99.5 F (37.5 C) (Oral)   Resp 18   Ht 4\' 11"  (1.499 m)   Wt 46.2 kg   SpO2 100%   BMI 20.57 kg/m  Constitutional: Thin well-appearing female sitting in bed, in no acute distress Cardiovascular: regular rate and rhythm Pulmonary/Chest: normal work of breathing on room air, lungs clear to auscultation bilaterally Abdominal: soft, non-tender, non-distended, positive bowel sounds MSK: normal bulk and tone, no lower extremity edema Neurological: alert & oriented x 3 Skin: warm and dry  Pertinent Labs, Studies, and Procedures:     Latest Ref Rng & Units 02/18/2022    7:55 AM  02/17/2022    2:57 AM 12/08/2021    9:52 AM  CBC  WBC 4.0 - 10.5 K/uL 12.7  12.4  8.3   Hemoglobin 12.0 - 15.0 g/dL 12.0  13.0  11.5   Hematocrit 36.0 - 46.0 % 38.4  41.4  37.5   Platelets 150 - 400 K/uL 299  328  444        Latest Ref Rng & Units 02/18/2022    7:55 AM 02/17/2022    2:57 AM 11/28/2021    6:36 AM  CMP  Glucose 70 - 99 mg/dL 166  98  122   BUN 6 - 20 mg/dL 6  6  <5   Creatinine 0.44 - 1.00 mg/dL 0.73  0.64  0.48   Sodium 135 - 145 mmol/L 134  139  135   Potassium 3.5 - 5.1 mmol/L 3.8  3.7  3.8   Chloride 98 - 111 mmol/L  102  105  107   CO2 22 - 32 mmol/L 22  21  22    Calcium 8.9 - 10.3 mg/dL 9.0  9.5  8.9     CT ABDOMEN PELVIS W WO CONTRAST  Result Date: 02/17/2022 CLINICAL DATA:  Pyelonephritis suspected EXAM: CT ABDOMEN AND PELVIS WITHOUT AND WITH CONTRAST TECHNIQUE: Multidetector CT imaging of the abdomen and pelvis was performed following the standard protocol before and following the bolus administration of intravenous contrast. RADIATION DOSE REDUCTION: This exam was performed according to the departmental dose-optimization program which includes automated exposure control, adjustment of the mA and/or kV according to patient size and/or use of iterative reconstruction technique. CONTRAST:  48mL OMNIPAQUE IOHEXOL 350 MG/ML SOLN COMPARISON:  CT abdomen and pelvis dated February 17, 2022 FINDINGS: Lower chest: No acute abnormality. Hepatobiliary: No focal liver abnormality is seen. No gallstones, gallbladder wall thickening, or biliary dilatation. Pancreas: Unremarkable. No pancreatic ductal dilatation or surrounding inflammatory changes. Spleen: Normal in size without focal abnormality. Adrenals/Urinary Tract: Kidneys enhance symmetrically. No hydronephrosis or nephrolithiasis. Bladder is unremarkable. Stomach/Bowel: Stomach is within normal limits. Appendix appears normal. No evidence of bowel wall thickening, distention, or inflammatory changes. Vascular/Lymphatic: No  significant vascular findings are present. No enlarged abdominal or pelvic lymph nodes. Reproductive: Uterus and bilateral adnexa are unremarkable. Other: No abdominal wall hernia or abnormality. No abdominopelvic ascites. Musculoskeletal: No acute or significant osseous findings. IMPRESSION: No acute findings in the abdomen or pelvis, including no CT evidence of pyelonephritis. Electronically Signed   By: February 19, 2022 M.D.   On: 02/17/2022 16:09   CT Renal Stone Study  Result Date: 02/17/2022 CLINICAL DATA:  Flank pain. EXAM: CT ABDOMEN AND PELVIS WITHOUT CONTRAST TECHNIQUE: Multidetector CT imaging of the abdomen and pelvis was performed following the standard protocol without IV contrast. RADIATION DOSE REDUCTION: This exam was performed according to the departmental dose-optimization program which includes automated exposure control, adjustment of the mA and/or kV according to patient size and/or use of iterative reconstruction technique. COMPARISON:  CT abdomen pelvis dated 11/24/2021. FINDINGS: Evaluation of this exam is limited in the absence of intravenous contrast. Lower chest: The visualized lung bases are clear. No intra-abdominal free air or free fluid. Hepatobiliary: The liver is unremarkable. No biliary ductal dilatation. The gallbladder is unremarkable Pancreas: Unremarkable. No pancreatic ductal dilatation or surrounding inflammatory changes. Spleen: Normal in size without focal abnormality. Adrenals/Urinary Tract: The adrenal glands are unremarkable. There is no hydronephrosis or nephrolithiasis on either side. The visualized ureters and urinary bladder appear unremarkable. Stomach/Bowel: There is moderate stool throughout the no bowel obstruction or active inflammation. The appendix is normal. Vascular/Lymphatic: Mild aortoiliac atherosclerotic disease. The IVC is unremarkable. No portal venous gas. There is no adenopathy. Reproductive: The uterus is poorly visualized.  No adnexal masses.  Other: None Musculoskeletal: Lower lumbar facet arthropathy. No acute osseous pathology. IMPRESSION: 1. No acute intra-abdominal or pelvic pathology. No hydronephrosis or nephrolithiasis. 2. Moderate colonic stool burden. No bowel obstruction. Normal appendix. 3.  Aortic Atherosclerosis (ICD10-I70.0). Electronically Signed   By: 11/26/2021 M.D.   On: 02/17/2022 03:49   CT L-SPINE NO CHARGE  Result Date: 02/17/2022 CLINICAL DATA:  Left flank pain EXAM: CT LUMBAR SPINE WITHOUT CONTRAST TECHNIQUE: Multidetector CT imaging of the lumbar spine was performed without intravenous contrast administration. Multiplanar CT image reconstructions were also generated. RADIATION DOSE REDUCTION: This exam was performed according to the departmental dose-optimization program which includes automated exposure control, adjustment of the mA and/or kV according to patient  size and/or use of iterative reconstruction technique. COMPARISON:  None Available. 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 Paraspinal and other soft tissues: Negative. Disc levels: There is severe facet arthrosis at L4-5, left-greater-than-right. No spinal canal stenosis or nerve root impingement within the lumbar spine. IMPRESSION: 1. 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 2. 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 3. No spinal canal stenosis or nerve root impingement within the lumbar spine. Electronically Signed   By: Ulyses Jarred M.D.   On: 02/17/2022 03:48     Discharge Instructions: Discharge Instructions     Call MD for:  difficulty breathing, headache or visual disturbances   Complete by: As directed    Call MD for:  extreme fatigue   Complete by: As directed    Call MD for:  persistant dizziness or  light-headedness   Complete by: As directed    Call MD for:  persistant nausea and vomiting   Complete by: As directed    Call MD for:  severe uncontrolled pain   Complete by: As directed    Call MD for:  temperature >100.4   Complete by: As directed    Diet - low sodium heart healthy   Complete by: As directed    Increase activity slowly   Complete by: As directed        Signed: Johny Blamer, DO 02/18/2022, 2:49 PM   Pager: [phone]

## 2022-02-18 NOTE — Progress Notes (Signed)
Internal Medicine Attending:   I saw and examined the patient. I reviewed the resident's H&P note and I agree with the resident's findings and plan as documented in the resident's note.  In brief, patient is a 51 year old female with a past medical history of hypertension, hyperlipidemia, TIA, diet-controlled diabetes, recent admission in August for MRSA bacteremia and UTI who presented to the ED with acute left flank pain x1 day.  Pain was persistent and crampy in nature and radiated to her groin.  Patient also complains of increased urinary frequency but no dysuria.  No fevers or chills, no nausea or vomiting.  Today, patient states that she has persistent abdominal pain and soreness but feels better than she did yesterday.  Patient does complain of bilateral hand pain worse on the right than the left which is a chronic ongoing problem for her.  States that she has been unable to follow-up with orthopedics for this.  Vitals:   02/18/22 0457 02/18/22 0935  BP: 121/76 116/78  Pulse: (!) 106 (!) 108  Resp: 17 18  Temp: 98.6 F (37 C) 99.5 F (37.5 C)  SpO2: 100% 100%   On exam, patient is lying in bed in no apparent distress.  Cardiovascular exam reveals tachycardia with normal rhythm.  Lungs are clear to auscultation bilaterally.  Abdomen is soft, mild epigastric and right lower quadrant tenderness noted on exam, nondistended, normoactive bowel sounds.  Lower extremities are nontender to palpation and no edema was noted.  Patient's mood and affect are normal.  Assessment and plan:  1.  UTI: -Patient presented to ED with acute onset of flank pain, increased urinary frequency and was found to have a mild leukocytosis of 12.4.  Her presentation was concerning for possible renal stone versus pyelonephritis.  However, her CT abdomen showed no evidence of renal stone nor acute pyelonephritis.  Patient's UA however was consistent with UTI. -We will continue ceftriaxone 1 g IV every 24 hours for  now -Patient's urine culture growing greater than 100,000 colonies per mL of gram-negative rods.  We will follow-up sensitivities and speciation -Patient still with some persistent flank pain but overall improved.  The etiology of her flank pain remains uncertain but possibly musculoskeletal in nature.  Patient was also noted to have increased stool burden on CT abdomen which may also be contributing to her pain.  Patient did not have a bowel movement yesterday.  We will start the patient on a bowel regimen. - I suspect she should be stable for DC home on oral cefdinir to complete a 5 to 7-day course if her pain continues to improve.

## 2022-02-18 NOTE — Discharge Instructions (Addendum)
You were hospitalized for urinary tract infection. Thank you for allowing [place] to be part of your care.   We arranged for you to follow up at:  The internal medicine center on 03/01/2022 at 8:45 AM with Dr. Geraldo Pitter   Please note these changes made to your medications:   Please START taking:  Cefdinir 300 mg twice daily for 3 days starting tomorrow Oxycodone 5 mg every 6 hours as needed for pain  Please make sure to finish your antibiotic course and if you have any new or worsening symptoms please call our clinic or return to the emergency department.  Please call our clinic if you have any questions or concerns, we may be able to help and keep you from a long and expensive emergency room wait. Our clinic and after hours phone number is [phone], the best time to call is Monday through Friday 9 am to 4 pm but there is always someone available 24/7 if you have an emergency. If you need medication refills please notify your pharmacy one week in advance and they will send [place] a request.

## 2022-02-18 NOTE — TOC Transition Note (Signed)
Transition of Care [facility]) - CM/SW Discharge Note   Patient Details  Name: Leah Rose MRN: [id number] Date of Birth: 10/14/1970  [facility] of [facility]) CM/SW Contact:  Tom-Johnson, Hershal Coria, RN Phone Number: 02/18/2022, 4:51 PM   Clinical Narrative:     Patient is scheduled for discharge today. Patient requested transportation assistance to and from work and also to and from MD appointments. Access [place] application filled and secure emailed to [employer], awaiting response. CM notified patient someone will reach her with response.  No PT/OT needs or recommendations noted. Friend to transport at discharge. No further TOC needs noted.    Final next level of care: Home/Self Care Barriers to Discharge: Barriers Resolved   Patient Goals and CMS Choice Patient states their goals for this hospitalization and ongoing recovery are:: To return home CMS Medicare.gov Compare Post Acute Care list provided to:: Patient Choice offered to / list presented to : NA  Discharge Placement                Patient to be transferred to facility by: Friend      Discharge Plan and Services                DME Arranged: N/A DME Agency: NA       HH Arranged: NA HH Agency: NA        Social Determinants of Health (SDOH) Interventions Transportation Interventions: Inpatient TOC, Other (Comment)   Readmission Risk Interventions    11/29/2021    2:27 PM  Readmission Risk Prevention Plan  Post Dischage Appt Complete  Medication Screening Complete  Transportation Screening Complete

## 2022-02-18 NOTE — Progress Notes (Signed)
Leah Rose to be discharged Home per MD order. Discussed prescriptions and follow up appointments with the patient. Prescriptions given to patient; medication list explained in detail. Patient verbalized understanding.  Skin clean, dry and intact without evidence of skin break down, no evidence of skin tears noted. IV catheter discontinued intact. Site without signs and symptoms of complications. Dressing and pressure applied. Pt denies pain at the site currently. No complaints noted.  Patient free of lines, drains, and wounds.   An After Visit Summary (AVS) was printed and given to the patient. Patient escorted via wheelchair, and discharged home via private auto.  Arvilla Meres, RN

## 2022-02-18 NOTE — Hospital Course (Addendum)
Leah Rose is a 51 y/o female with PMH of HTN, HLD, prior TIA, T2DM, and recent UTI with MRSA bacteremia who presents with acute left flank pain and suprapubic pain and is admitted for UTI.  Urinary tract infection-E. coli Patient presented with acute onset left flank pain with associated suprapubic pain without dysuria or fever.  She had recently had a UTI as well as MRSA bacteremia.  UA had large amount of leukocytes, positive for nitrites, many bacteria, and red blood cells.  Urine culture grew over 100,000 colonies of E. coli.  Due to her flank pain there was some concern for pyelonephritis however her CT abdomen pelvis showed no signs of renal involvement and CT stone protocol showed no evidence of stones.  The pain improved throughout her admission.  She remained afebrile and her white count was stable at 12 during admission.  She was started on ceftriaxone on admission and discharged on cefadroxil 500 mg twice daily for 3 more days to complete a total of 5 days of antibiotics.  She will follow-up in our clinic on 03/01/2022.  Return precautions were discussed.  Constipation Patient has not had a bowel movement since she got to the hospital which is irregular for her as she usually goes every day.  CT abdomen pelvis did show a moderate stool burden but no obstruction. We started her on miralax and senna-docusate. She was discharged on miralax with return precautions.   Hypertension Hyperlipidemia Prior TIA We continued her home medications of aspirin, olmesartan, hydrochlorothiazide, and rosuvastatin.  T2DM Blood sugars here were very well controlled here without medication and her A1c was 5.7 in 06/2021.  Depression Patient has been prescribed duloxetine in the past but was apparently not taking this.  We will discuss this at hospital follow-up.  Malnutrition  Pt with a BMI of 20 and noted food insecurity. She has been on ensure in the past and would like to get those again. We will  continue to work on her nutrition outpatient.

## 2022-02-19 LAB — URINE CULTURE: Culture: >=100000 — AB

## 2022-02-21 ENCOUNTER — Other Ambulatory Visit: Payer: Self-pay | Admitting: Student

## 2022-02-21 DIAGNOSIS — N3000 Acute cystitis without hematuria: Secondary | ICD-10-CM

## 2022-02-21 MED ORDER — NITROFURANTOIN MONOHYD MACRO 100 MG PO CAPS: 100.0000 mg | ORAL_CAPSULE | Freq: Two times a day (BID) | ORAL | 0 refills | Status: AC

## 2022-02-21 NOTE — Progress Notes (Signed)
Attempted to call the patient, and the patient's daughter, and the patient's sister without success multiple times.  Fortunately I was able to reach her other sister who verified the patient's name and date of birth.  She was able to take a message that she said she could get to the patient.  I informed her of the new antibiotic nitrofurantoin that is sent to her pharmacy and with the instructions to take it twice a day for 5 days.

## 2022-03-01 ENCOUNTER — Encounter: Payer: 59 | Admitting: Student

## 2022-03-02 ENCOUNTER — Encounter: Payer: Self-pay | Admitting: Internal Medicine

## 2022-03-15 ENCOUNTER — Telehealth: Payer: Self-pay

## 2022-03-15 NOTE — Telephone Encounter (Signed)
Requesting pain medication, pt states she is hurting. Pt would like a call @ 11:00, or 1:30. This is the time she will be on her break. States she has the same pain when she went to the hospital.

## 2022-04-02 ENCOUNTER — Encounter: Payer: Self-pay | Admitting: *Deleted

## 2022-07-21 ENCOUNTER — Emergency Department (HOSPITAL_COMMUNITY)
Admission: EM | Admit: 2022-07-21 | Discharge: 2022-07-22 | Disposition: A | Discharge status: Home / Self Care | Payer: 59 | Attending: Emergency Medicine | Admitting: Emergency Medicine

## 2022-07-21 ENCOUNTER — Encounter (HOSPITAL_COMMUNITY): Payer: Self-pay | Admitting: Emergency Medicine

## 2022-07-21 ENCOUNTER — Other Ambulatory Visit: Payer: Self-pay

## 2022-07-21 DIAGNOSIS — N390 Urinary tract infection, site not specified: Secondary | ICD-10-CM | POA: Diagnosis present

## 2022-07-21 DIAGNOSIS — R1084 Generalized abdominal pain: Secondary | ICD-10-CM | POA: Diagnosis present

## 2022-07-21 DIAGNOSIS — Z7982 Long term (current) use of aspirin: Secondary | ICD-10-CM | POA: Diagnosis not present

## 2022-07-21 DIAGNOSIS — A419 Sepsis, unspecified organism: Secondary | ICD-10-CM | POA: Insufficient documentation

## 2022-07-21 LAB — CBC
HCT: 40 % (ref 36.0–46.0)
Hemoglobin: 12.9 g/dL (ref 12.0–15.0)
MCH: 26.1 pg (ref 26.0–34.0)
MCHC: 32.3 g/dL (ref 30.0–36.0)
MCV: 80.8 fL (ref 80.0–100.0)
Platelets: 277 10*3/uL (ref 150–400)
RBC: 4.95 MIL/uL (ref 3.87–5.11)
RDW: 14.7 % (ref 11.5–15.5)
WBC: 11.6 10*3/uL — ABNORMAL HIGH (ref 4.0–10.5)
nRBC: 0 % (ref 0.0–0.2)

## 2022-07-21 LAB — I-STAT BETA HCG BLOOD, ED (MC, WL, AP ONLY): I-stat hCG, quantitative: <5 m[IU]/mL (ref <5)

## 2022-07-21 MED ORDER — ACETAMINOPHEN 325 MG PO TABS
650.0000 mg | ORAL_TABLET | Freq: Once | ORAL | Status: AC | PRN
  Administered 2022-07-21: 650 mg via ORAL
  Filled 2022-07-21: qty 2

## 2022-07-21 NOTE — ED Triage Notes (Signed)
Pt c/o generalized abdominal pain, headache, and fatigue since Tuesday. Endorses nausea and increased urinary frequency.

## 2022-07-22 ENCOUNTER — Emergency Department (HOSPITAL_COMMUNITY): Payer: 59

## 2022-07-22 LAB — COMPREHENSIVE METABOLIC PANEL
ALT: 36 U/L (ref 0–44)
AST: 36 U/L (ref 15–41)
Albumin: 3.3 g/dL — ABNORMAL LOW (ref 3.5–5.0)
Alkaline Phosphatase: 81 U/L (ref 38–126)
Anion gap: 13 (ref 5–15)
BUN: 7 mg/dL (ref 6–20)
CO2: 21 mmol/L — ABNORMAL LOW (ref 22–32)
Calcium: 9.1 mg/dL (ref 8.9–10.3)
Chloride: 104 mmol/L (ref 98–111)
Creatinine, Ser: 0.62 mg/dL (ref 0.44–1.00)
GFR, Estimated: >60 mL/min (ref >60)
Glucose, Bld: 113 mg/dL — ABNORMAL HIGH (ref 70–99)
Potassium: 3.2 mmol/L — ABNORMAL LOW (ref 3.5–5.1)
Sodium: 138 mmol/L (ref 135–145)
Total Bilirubin: 0.4 mg/dL (ref 0.3–1.2)
Total Protein: 7.2 g/dL (ref 6.5–8.1)

## 2022-07-22 LAB — URINALYSIS, ROUTINE W REFLEX MICROSCOPIC
Bilirubin Urine: NEGATIVE
Glucose, UA: NEGATIVE mg/dL
Ketones, ur: NEGATIVE mg/dL
Nitrite: NEGATIVE
Protein, ur: 30 mg/dL — AB
Specific Gravity, Urine: 1.009 (ref 1.005–1.030)
WBC, UA: >50 WBC/hpf (ref 0–5)
pH: 6 (ref 5.0–8.0)

## 2022-07-22 LAB — LIPASE, BLOOD: Lipase: 22 U/L (ref 11–51)

## 2022-07-22 MED ORDER — NITROFURANTOIN MONOHYD MACRO 100 MG PO CAPS: 100.0000 mg | ORAL_CAPSULE | Freq: Two times a day (BID) | ORAL | 0 refills | Status: AC

## 2022-07-22 MED ORDER — POTASSIUM CHLORIDE CRYS ER 20 MEQ PO TBCR
40.0000 meq | EXTENDED_RELEASE_TABLET | Freq: Once | ORAL | Status: AC
  Administered 2022-07-22: 40 meq via ORAL
  Filled 2022-07-22: qty 2

## 2022-07-22 MED ORDER — NITROFURANTOIN MONOHYD MACRO 100 MG PO CAPS
100.0000 mg | ORAL_CAPSULE | Freq: Two times a day (BID) | ORAL | Status: DC
  Administered 2022-07-22: 100 mg via ORAL
  Filled 2022-07-22 (×2): qty 1

## 2022-07-22 MED ORDER — KETOROLAC TROMETHAMINE 15 MG/ML IJ SOLN
15.0000 mg | Freq: Once | INTRAMUSCULAR | Status: AC
  Administered 2022-07-22: 15 mg via INTRAVENOUS
  Filled 2022-07-22: qty 1

## 2022-07-22 MED ORDER — SODIUM CHLORIDE 0.9 % IV SOLN
1.0000 g | Freq: Once | INTRAVENOUS | Status: AC
  Administered 2022-07-22: 1 g via INTRAVENOUS
  Filled 2022-07-22: qty 10

## 2022-07-22 MED ORDER — LACTATED RINGERS IV BOLUS
2000.0000 mL | Freq: Once | INTRAVENOUS | Status: AC
  Administered 2022-07-22: 2000 mL via INTRAVENOUS

## 2022-07-22 MED ORDER — ACETAMINOPHEN 500 MG PO TABS
1000.0000 mg | ORAL_TABLET | ORAL | Status: AC
  Administered 2022-07-22: 1000 mg via ORAL
  Filled 2022-07-22: qty 2

## 2022-07-22 NOTE — Consult Note (Addendum)
Date: 07/22/2022               Patient Name:  Leah Rose MRN: [id number]  DOB: 06/16/1970 Age / Sex: 52 y.o., female   PCP: Dickie La, MD         Requesting Physician: Dr. Linwood Dibbles, MD    Consulting Reason:  UTI     Chief Complaint: Abdominal pain  History of Present Illness:  Charley Lafrance is a 52 year old person living with HTN, HLD, TIA, diet-controlled T2DM, and history of ESBL E coli UTI who presents for 1 week of suprapubic abdominal pain radiating to the left flank. Pain is associated with increased urinary frequency and loss of appetite. Reports pain is similar to prior UTIs. Reports symptoms resolved after oral antibiotics last November. States she has remained ambulatory and tolerating PO intake. Notes fever to 100.3 at home and felt her heart has been beating faster. Has been taking tylenol without much improvement. She denies chest pain, dyspnea, n/v/d, hematuria, and dysuria.  Meds: No current facility-administered medications for this encounter.   Current Outpatient Medications  Medication Sig Dispense Refill   aspirin EC 81 MG tablet Take 81 mg by mouth daily. Swallow whole.     hydrochlorothiazide (HYDRODIURIL) 12.5 MG tablet Take 1 tablet (12.5 mg total) by mouth daily. 30 tablet 0   irbesartan (AVAPRO) 300 MG tablet Take 1 tablet (300 mg total) by mouth daily. 30 tablet 0   medroxyPROGESTERone (DEPO-PROVERA) 150 MG/ML injection Inject 150 mg into the muscle every 3 (three) months.     rosuvastatin (CRESTOR) 20 MG tablet Take 1 tablet (20 mg total) by mouth daily. 30 tablet 0   DULoxetine (CYMBALTA) 30 MG capsule Take 1 capsule (30 mg total) by mouth daily. (Patient not taking: Reported on 02/17/2022) 30 capsule 0   Nutritional Supplements (ENSURE COMPLETE SHAKE) LIQD Take 1 each by mouth 3 (three) times daily as needed. (Patient not taking: Reported on 07/22/2022) 237 mL 99   polyethylene glycol (MIRALAX / GLYCOLAX) 17 g packet Take 17 g by mouth daily. (Patient not  taking: Reported on 07/22/2022) 14 each 0    Allergies: Allergies as of 07/21/2022 - Review Complete 07/21/2022  Allergen Reaction Noted   Ibuprofen Swelling 02/17/2022   Keflex [cephalexin] Hives, Itching, and Swelling 11/20/2021   Penicillins Itching    Sulfonamide derivatives Itching and Rash    Past Medical History:  Diagnosis Date   Chronic venous insufficiency 06/23/2017   Essential hypertension 03/14/2006   Hypertensive emergency 07/15/2020   Hypokalemia 07/15/2020   Moderate recurrent major depression 03/14/2006   Tobacco abuse 03/14/2006   Past Surgical History:  Procedure Laterality Date   BUBBLE STUDY  11/26/2021   Procedure: BUBBLE STUDY;  Surgeon: Pricilla Riffle, MD;  Location: [facility] ENDOSCOPY;  Service: Cardiovascular;;   TEE WITHOUT CARDIOVERSION N/A 11/26/2021   Procedure: TRANSESOPHAGEAL ECHOCARDIOGRAM (TEE);  Surgeon: Pricilla Riffle, MD;  Location: [facility] ENDOSCOPY;  Service: Cardiovascular;  Laterality: N/A;   Family History  Problem Relation Age of Onset   Seizures Mother    Cirrhosis Mother    Birth defects Sister    Hypertension Sister    Breast cancer Maternal Grandmother    Thyroid disease Sister    Social History   Socioeconomic History   Marital status: Single    Spouse name: Not on file   Number of children: 1   Years of education: 12   Highest education level: Not on file  Occupational History  Occupation: Chief Operating Officer    Comment: Production manager [employer]  Tobacco Use   Smoking status: Every Day    Packs/day: .5    Types: Cigarettes   Smokeless tobacco: Never   Tobacco comments:    less sometimes.   Vaping Use   Vaping Use: Never used  Substance and Sexual Activity   Alcohol use: Yes    Alcohol/week: 14.0 standard drinks of alcohol    Types: 14 Cans of beer per week   Drug use: No   Sexual activity: Not on file  Other Topics Concern   Not on file  Social History Narrative   Lives with her "old man"  and a cat in [place]   Social Determinants of Health   Financial Resource Strain: Not on file  Food Insecurity: No Food Insecurity (02/18/2022)   Hunger Vital Sign    Worried About Running Out of Food in the Last Year: Never true    Ran Out of Food in the Last Year: Never true  Transportation Needs: Unmet Transportation Needs (02/18/2022)   PRAPARE - Administrator, Civil Service (Medical): No    Lack of Transportation (Non-Medical): Yes  Physical Activity: Not on file  Stress: Not on file  Social Connections: Not on file  Intimate Partner Violence: Not At Risk (02/18/2022)   Humiliation, Afraid, Rape, and Kick questionnaire    Fear of Current or Ex-Partner: No    Emotionally Abused: No    Physically Abused: No    Sexually Abused: No    Review of Systems: Pertinent items are noted in HPI.  Physical Exam: Blood pressure (!) 127/96, pulse 99, temperature 99.2 F (37.3 C), resp. rate 16, height 4\' 11"  (1.499 m), weight 45.4 kg, SpO2 99 %. BP (!) 127/96   Pulse 99   Temp 99.2 F (37.3 C)   Resp 16   Ht 4\' 11"  (1.499 m)   Wt 45.4 kg   SpO2 99%   BMI 20.20 kg/m   Constitutional: tired appearing, sleeping in bed, in no acute distress HENT: Normocephalic and atraumatic, EOMI, conjunctiva normal, moist mucous membranes Cardiovascular: Normal rate, regular rhythm, no murmurs, rubs, gallops.  No JVD, no LE edema Respiratory: No respiratory distress, no accessory muscle use.  Effort is normal.  Lungs are clear to auscultation bilaterally. GI: tenderness to palpation of the suprapubic area, no CVA. Non distended, soft,  normal active bowel sounds Musculoskeletal: Normal bulk and tone.  No peripheral edema noted. Neurological: Is alert and oriented x4, no apparent focal deficits noted. Skin: Warm and dry.  No rash, erythema, lesions noted. Psychiatric: Normal mood and affect.   Imaging results:  CT Renal Stone Study  Result Date: 07/22/2022 CLINICAL DATA:   52 year old female with abdominal pain, headache, fatigue, nausea, urinary frequency. EXAM: CT ABDOMEN AND PELVIS WITHOUT CONTRAST TECHNIQUE: Multidetector CT imaging of the abdomen and pelvis was performed following the standard protocol without IV contrast. RADIATION DOSE REDUCTION: This exam was performed according to the departmental dose-optimization program which includes automated exposure control, adjustment of the mA and/or kV according to patient size and/or use of iterative reconstruction technique. COMPARISON:  CT Abdomen and Pelvis 02/17/2022 and earlier. FINDINGS: Lower chest: Similar minor lung base atelectasis. No pericardial or pleural effusion. Hepatobiliary: Negative noncontrast liver and gallbladder. Pancreas: Negative. Spleen: Negative. Adrenals/Urinary Tract: Normal adrenal glands. Kidneys appear symmetric and nonobstructed. No nephrolithiasis. No pararenal inflammation is evident. Ureters seem to be decompressed and are difficult to delineate.  Diminutive, unremarkable bladder. Chronic pelvic phleboliths appear stable. Stomach/Bowel: Mildly gas distended rectum but decompressed upstream sigmoid colon. Retained gas and stool in the descending, transverse (redundant) and right colon. Appendix remains within normal limits series 3, image 45. No large bowel inflammation identified. No dilated small bowel. Small distal esophageal phrenic ampulla (normal variant) or hiatal hernia. Otherwise negative stomach and duodenum. No free air or free fluid. Vascular/Lymphatic: Normal caliber abdominal aorta. Bilateral calcified iliac artery atherosclerosis. Vascular patency is not evaluated in the absence of IV contrast. No lymphadenopathy identified. Reproductive: Negative noncontrast appearance. Other: Small bilateral chronic pelvic phleboliths.  No free fluid. Musculoskeletal: Lumbosacral junction facet arthropathy. No acute osseous abnormality identified. IMPRESSION: No acute or inflammatory process  identified in the non-contrast abdomen or pelvis. Electronically Signed   By: Odessa Fleming M.D.   On: 07/22/2022 06:07    Other results: EKG: sinus tachycardia, right axis deviation.  Assessment, Plan, & Recommendations by Problem: Active Problems:   UTI (urinary tract infection)  Acute cystitis Patient with 3 days of suprapubic pain. History of multiple UTIs, most recently with ESBL E. Coli treated with nitrofurantoin in November 2023. Fever of 100.5 w/ tachycardia in the ED. This has improved with IVF and tylenol. UA with pyuria with many bacteria. WBC mildly elevated to 11.6. No signs of end organ dysfunction to suggest sepsis. Clinically appears well. Has multiple antibiotics allergies to penicillin, keflex, and sulfas causing hives. Given ceftriaxone 1 g in ED. CT renal study without evidence of pyelonephritis or upper tract infection, no nephrolithiasis. This morning, Ms. Navarrete is reporting improved pain and discomfort. Given improvement in her vitals and symptoms, would recommend discharge with oral antibiotics. Discussed this with patient who is agreeable to this. -Nitrofurantoin 100 mg twice daily for 5 days based on prior culture results -Encourage PO hydration -Tylenol 1000 mg TID for pain, Ibuprofen 400 mg q8h only for severe pain -Follow up urine culture, change antibiotics as appropriate -She has clinic appointment with me on 07/27/2022, return precautions given  Hypokalemia K of 3.2. Is on HCTZ at home but has tolerated this well without siginficant hypokalemia in the past.  -Potassium 40 mEQ once -Will recheck K as outpatient   Discussed with Dr. Lynelle Doctor who is agreeable to discharging patient from ED with close outpatient follow up.   Signed: Quincy Simmonds, MD 07/22/2022, 8:15 AM

## 2022-07-22 NOTE — ED Notes (Signed)
Hooked patient back up to the monitor patient is resting with call bell in reach 

## 2022-07-22 NOTE — ED Provider Notes (Signed)
Patient was evaluated by to the teaching service including attending MD.  Please see their notes.  Pt was discharged from the ED after their evaluation   Linwood Dibbles, MD 07/22/22 1213

## 2022-07-22 NOTE — Discharge Instructions (Addendum)
Ms. Leah Rose you were treated for a UTI in the ED. Please take nitrofurantoin 100 mg twice daily for this. For pain please take tylenol 1000 mg three times daily, you can take ibuprofen  every 8 hours in addition for severe pain. We recommend limiting the amount of ibuprofen as you are also taking aspirin for stroke prevention. We will follow the urine cultures and give you a call if we need to make any changes. Please follow up at the Internal Medicine center on 07/27/2022 at 10.15. We will check if your symptoms are improved and discuss your other chronic medical conditions. Please call the clinic at [phone] if your symptoms are not improving or worsening.

## 2022-07-22 NOTE — ED Provider Notes (Signed)
[place] EMERGENCY DEPARTMENT AT [facility] Provider Note   CSN: [id number] Arrival date & time: 07/21/22  2245     History  Chief Complaint  Patient presents with   Abdominal Pain    Leah Rose is a 52 y.o. female.   Abdominal Pain Patient is a 52 year old female with past medical history significant for UTIs with ESBL cultures  She is present emergency room today with complaints of generalized abdominal pain, some increased urinary frequency and left flank pain.  She denies any chest pain difficulty breathing.  She denies any current nausea and states no episodes of vomiting.  Denies any fevers at home but was found to be febrile at 100.5 here in the ER.  She denies any vaginal discharge she denies any diarrhea or blood in stool.     Home Medications Prior to Admission medications   Medication Sig Start Date End Date Taking? Authorizing Provider  aspirin EC 81 MG tablet Take 81 mg by mouth daily. Swallow whole.    [provider]  DULoxetine (CYMBALTA) 30 MG capsule Take 1 capsule (30 mg total) by mouth daily. Patient not taking: Reported on 02/17/2022 11/29/21   Steffanie Rainwater, MD  hydrochlorothiazide (HYDRODIURIL) 12.5 MG tablet Take 1 tablet (12.5 mg total) by mouth daily. 02/19/22   Rocky Morel, DO  irbesartan (AVAPRO) 300 MG tablet Take 1 tablet (300 mg total) by mouth daily. 02/19/22   Rocky Morel, DO  medroxyPROGESTERone (DEPO-PROVERA) 150 MG/ML injection Inject 150 mg into the muscle every 3 (three) months. 11/18/21   [provider]  Nutritional Supplements (ENSURE COMPLETE SHAKE) LIQD Take 1 each by mouth 3 (three) times daily as needed. 02/18/22   Rocky Morel, DO  polyethylene glycol (MIRALAX / GLYCOLAX) 17 g packet Take 17 g by mouth daily. 02/19/22   Rocky Morel, DO  rosuvastatin (CRESTOR) 20 MG tablet Take 1 tablet (20 mg total) by mouth daily. 02/19/22   Rocky Morel, DO      Allergies    Ibuprofen,  Keflex [cephalexin], Penicillins, and Sulfonamide derivatives    Review of Systems   Review of Systems  Gastrointestinal:  Positive for abdominal pain.    Physical Exam Updated Vital Signs BP 122/86   Pulse (!) 103   Temp 99.9 F (37.7 C) (Oral)   Resp 18   Ht 4\' 11"  (1.499 m)   Wt 45.4 kg   SpO2 99%   BMI 20.20 kg/m  Physical Exam Vitals and nursing note reviewed.  Constitutional:      General: She is not in acute distress.    Appearance: She is ill-appearing.  HENT:     Head: Normocephalic and atraumatic.     Nose: Nose normal.  Eyes:     General: No scleral icterus. Cardiovascular:     Rate and Rhythm: Normal rate and regular rhythm.     Pulses: Normal pulses.     Heart sounds: Normal heart sounds.  Pulmonary:     Effort: Pulmonary effort is normal. No respiratory distress.     Breath sounds: No wheezing.  Abdominal:     Palpations: Abdomen is soft.     Tenderness: There is abdominal tenderness in the suprapubic area. There is left CVA tenderness.     Comments: Suprapubic tenderness present  Musculoskeletal:     Cervical back: Normal range of motion.     Right lower leg: No edema.     Left lower leg: No edema.  Skin:    General:  Skin is warm and dry.     Capillary Refill: Capillary refill takes less than 2 seconds.  Neurological:     Mental Status: She is alert. Mental status is at baseline.  Psychiatric:        Mood and Affect: Mood normal.        Behavior: Behavior normal.     ED Results / Procedures / Treatments   Labs (all labs ordered are listed, but only abnormal results are displayed) Labs Reviewed  COMPREHENSIVE METABOLIC PANEL - Abnormal; Notable for the following components:      Result Value   Potassium 3.2 (*)    CO2 21 (*)    Glucose, Bld 113 (*)    Albumin 3.3 (*)    All other components within normal limits  CBC - Abnormal; Notable for the following components:   WBC 11.6 (*)    All other components within normal limits   URINALYSIS, ROUTINE W REFLEX MICROSCOPIC - Abnormal; Notable for the following components:   APPearance CLOUDY (*)    Hgb urine dipstick MODERATE (*)    Protein, ur 30 (*)    Leukocytes,Ua LARGE (*)    Bacteria, UA MANY (*)    Non Squamous Epithelial 0-5 (*)    All other components within normal limits  URINE CULTURE  LIPASE, BLOOD  I-STAT BETA HCG BLOOD, ED ([facility], [facility], [facility] ONLY)    EKG EKG Interpretation  Date/Time:  Thursday July 21 2022 23:24:51 EDT Ventricular Rate:  112 PR Interval:  134 QRS Duration: 98 QT Interval:  334 QTC Calculation: 455 R Axis:   154 Text Interpretation: Sinus tachycardia Right axis deviation Nonspecific ST abnormality Abnormal ECG When compared with ECG of 20-Nov-2021 19:16, PREVIOUS ECG IS PRESENT Confirmed by Zadie Rhine ([id number]) on 07/22/2022 5:06:26 AM  Radiology No results found.  Procedures .Critical Care  Performed by: Gailen Shelter, PA Authorized by: Gailen Shelter, PA   Critical care provider statement:    Critical care time (minutes):  35   Critical care time was exclusive of:  Separately billable procedures and treating other patients and teaching time   Critical care was necessary to treat or prevent imminent or life-threatening deterioration of the following conditions:  Sepsis   Critical care was time spent personally by me on the following activities:  Development of treatment plan with patient or surrogate, review of old charts, re-evaluation of patient's condition, pulse oximetry, ordering and review of radiographic studies, ordering and review of laboratory studies, ordering and performing treatments and interventions, obtaining history from patient or surrogate, examination of patient and evaluation of patient's response to treatment   Care discussed with: admitting provider       Medications Ordered in ED Medications  acetaminophen (TYLENOL) tablet 1,000 mg (has no administration in time range)  lactated ringers  bolus 2,000 mL (has no administration in time range)  ketorolac (TORADOL) 15 MG/ML injection 15 mg (has no administration in time range)  cefTRIAXone (ROCEPHIN) 1 g in sodium chloride 0.9 % 100 mL IVPB (has no administration in time range)  acetaminophen (TYLENOL) tablet 650 mg (650 mg Oral Given 07/21/22 2335)    ED Course/ Medical Decision Making/ A&P                             Medical Decision Making Amount and/or Complexity of Data Reviewed Labs: ordered. Radiology: ordered.  Risk OTC drugs. Prescription drug management. Decision regarding hospitalization.  This patient presents to the ED for concern of fever/body aches, this involves a number of treatment options, and is a complaint that carries with it a moderate to high risk of complications and morbidity. A differential diagnosis was considered for the patient's symptoms which is discussed below:   On my initial evaluation patient is febrile and tachycardic, has urinary symptoms and multiple SIRS criteria met already has basic labs that show leukocytosis.  Sepsis with likely source being urine.   Co morbidities: Discussed in HPI   Brief History:  Patient is a 52 year old female with past medical history significant for UTIs with ESBL cultures  She is present emergency room today with complaints of generalized abdominal pain, some increased urinary frequency and left flank pain.  She denies any chest pain difficulty breathing.  She denies any current nausea and states no episodes of vomiting.  Denies any fevers at home but was found to be febrile at 100.5 here in the ER.  She denies any vaginal discharge she denies any diarrhea or blood in stool.    EMR reviewed including pt PMHx, past surgical history and past visits to ER.   See HPI for more details   Lab Tests:   I ordered and independently interpreted labs. Labs notable for Urinalysis with many bacteria large leukocytes consistent with UTI.  Urine  culture ordered and medications ordered, CBC with leukocytosis no anemia, CMP with mild hypokalemia i-STAT hCG negative for pregnancy.  Imaging Studies:  NAD. I personally reviewed all imaging studies and no acute abnormality found. I agree with radiology interpretation.  No ureteral stones  Cardiac Monitoring:  The patient was maintained on a cardiac monitor.  I personally viewed and interpreted the cardiac monitored which showed an underlying rhythm of: Sinus tachycardia EKG non-ischemic   Medicines ordered:  I ordered medication including Rocephin, 2 L of LR, Tylenol, Toradol for fever, abdominal pain, dehydration, sepsis Reevaluation of the patient after these medicines showed that the patient improved I have reviewed the patients home medicines and have made adjustments as needed   Critical Interventions:   treatment of sepsis   Consults/Attending Physician   I requested consultation with internal medicine teaching service Dr. Cyndie Chime,  and discussed lab and imaging findings as well as pertinent plan - they recommend: they will admit   Reevaluation:  After the interventions noted above I re-evaluated patient and found that they have :improved   Social Determinants of Health:      Problem List / ED Course:  Urosepsis has received Rocephin, 2 L of crystalloid, Tylenol, Toradol and seems to have significant improvement in her symptoms.  History of ESBL UTIs.  Has had cephalosporin susceptible UTIs in the not too distant past as well.  Discussed with pharmacy Fayrene Fearing of ER pharmacist.  Will initiate on Rocephin empirically.  Admitted to internal medicine teaching service   Dispostion:  After consideration of the diagnostic results and the patients response to treatment, I feel that the patent would benefit from admission   Final Clinical Impression(s) / ED Diagnoses Final diagnoses:  Sepsis, due to unspecified organism, unspecified whether acute organ dysfunction  present    Rx / DC Orders ED Discharge Orders     None         Gailen Shelter, [place] 07/22/22 1610    Zadie Rhine, MD 07/23/22 0201

## 2022-07-23 LAB — URINE CULTURE

## 2022-07-24 LAB — URINE CULTURE: Culture: >=100000 — AB

## 2022-07-25 ENCOUNTER — Telehealth (HOSPITAL_BASED_OUTPATIENT_CLINIC_OR_DEPARTMENT_OTHER): Payer: Self-pay | Admitting: *Deleted

## 2022-07-25 NOTE — Telephone Encounter (Signed)
Post ED Visit - Positive Culture Follow-up  Culture report reviewed by antimicrobial stewardship pharmacist: [facility]  Enzo Bi, Pharm.D.  Celedonio Miyamoto, Pharm.D., BCPS AQ-ID  Garvin Fila, Pharm.D., BCPS  Georgina Pillion, [address].D., BCPS  [place], [address].D., BCPS, AAHIVP  Estella Husk, Pharm.D., BCPS, AAHIVP  Lysle Pearl, PharmD, BCPS  Phillips Climes, PharmD, BCPS  Agapito Games, PharmD, BCPS  Verlan Friends, PharmD  Mervyn Gay, PharmD, BCPS  Vinnie Level, PharmD  [facility]  Len Childs, PharmD  Greer Pickerel, PharmD  Adalberto Cole, PharmD  Perlie Gold, Rph  Lonell Face) Jean Rosenthal, PharmD  Earl Many, PharmD  Junita Push, PharmD  Dorna Leitz, PharmD  Terrilee Files, PharmD  Lynann Beaver, PharmD  Keturah Barre, PharmD  Loralee Pacas, PharmD  Bernadene Person, PharmD   Positive urine culture Treated with Nitrofurantoin Monohyd Macro, organism sensitive to the same and no further patient follow-up is required at this time.  Eldridge Scot, PharmD  Virl Axe Talley 07/25/2022, 10:32 AM

## 2022-07-27 ENCOUNTER — Ambulatory Visit: Payer: 59 | Admitting: Student

## 2022-07-27 ENCOUNTER — Other Ambulatory Visit: Payer: Self-pay | Admitting: Student

## 2022-07-27 ENCOUNTER — Encounter: Payer: Self-pay | Admitting: Student

## 2022-07-27 VITALS — BP 142/91 | HR 96 | Temp 98.0°F | Ht 59.0 in | Wt 100.9 lb

## 2022-07-27 DIAGNOSIS — I10 Essential (primary) hypertension: Secondary | ICD-10-CM | POA: Diagnosis not present

## 2022-07-27 DIAGNOSIS — F331 Major depressive disorder, recurrent, moderate: Secondary | ICD-10-CM | POA: Diagnosis not present

## 2022-07-27 DIAGNOSIS — E119 Type 2 diabetes mellitus without complications: Secondary | ICD-10-CM

## 2022-07-27 DIAGNOSIS — N3000 Acute cystitis without hematuria: Secondary | ICD-10-CM

## 2022-07-27 DIAGNOSIS — T3995XA Adverse effect of unspecified nonopioid analgesic, antipyretic and antirheumatic, initial encounter: Secondary | ICD-10-CM

## 2022-07-27 DIAGNOSIS — G4440 Drug-induced headache, not elsewhere classified, not intractable: Secondary | ICD-10-CM

## 2022-07-27 MED ORDER — IRBESARTAN 300 MG PO TABS: 300.0000 mg | ORAL_TABLET | Freq: Every day | ORAL | 3 refills | Status: DC

## 2022-07-27 MED ORDER — DULOXETINE HCL 30 MG PO CPEP: 30.0000 mg | ORAL_CAPSULE | Freq: Every day | ORAL | 3 refills | Status: DC

## 2022-07-27 MED ORDER — HYDROCHLOROTHIAZIDE 12.5 MG PO TABS: 12.5000 mg | ORAL_TABLET | Freq: Every day | ORAL | 3 refills | Status: DC

## 2022-07-27 NOTE — Patient Instructions (Addendum)
Headaches Please stop taking tylenol or BC powders as this may be worsening your headaches If you still have headaches after stopping this we may need to discuss medications to prevent headaches  Loss of appetite Please restart duloxetine 30 mg daily this may help you headaches and increase you appetite  You can try nutritional shakes such as ensure to help with this as well if you do no want to eat a full meal  Your blood pressure is high today Please take irbesartan 300 mg daily and HCTZ 12.5 mg daily  Follow up in 1 month

## 2022-07-28 DIAGNOSIS — G4440 Drug-induced headache, not elsewhere classified, not intractable: Secondary | ICD-10-CM | POA: Insufficient documentation

## 2022-07-28 NOTE — Assessment & Plan Note (Signed)
BP elevated today. Reports compliance with HCTZ and olmesartan but last fill for these medications was in November. Will refill her medications for with 90 day supply and have her follow up in 4 weeks for BP recheck. Will need BMP at that time.

## 2022-07-28 NOTE — Assessment & Plan Note (Signed)
Need repeat A1c but unfortunately her transportation needed leave. Will check at next visit.

## 2022-07-28 NOTE — Progress Notes (Signed)
Established Patient Office Visit  Subjective   Patient ID: Leah Rose, female    DOB: [birth year]  Age: 52 y.o. MRN: [id number]  Chief Complaint  Patient presents with   DISCUSS ABOUT GETTING PAIN MEDICATION    ROUTINE CHECKUP    Leah Rose is a 53 y.o. person living with a history listed below who presents to clinic for follow up of UTI. Please refer to problem based charting for further details and assessment and plan of current problem and chronic medical conditions.    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   Loss of appetite 06/23/2021   Bilateral hand pain 06/23/2021   Left ear hearing loss 06/23/2021   Memory loss 09/14/2020   TIA (transient ischemic attack) 07/22/2020   Diabetes 07/22/2020   Mild concentric left ventricular hypertrophy (LVH) 07/16/2020   Hyperlipidemia 07/15/2020   Chronic venous insufficiency 06/23/2017   Healthcare maintenance 08/12/2016   Tobacco abuse 03/14/2006   Moderate recurrent major depression 03/14/2006   Essential hypertension 03/14/2006      ROS: negative as per HPI     Objective:     BP (!) 142/91 (BP Location: Right Arm, Patient Position: Sitting, Cuff Size: Normal)   Pulse 96   Temp 98 F (36.7 C) (Oral)   Ht  (1.499 m)   Wt 100 lb 14.4 oz (45.8 kg)   SpO2 100%   BMI 20.38 kg/m  BP Readings from Last 3 Encounters:  07/27/22 (!) 142/91  07/22/22 (!) 147/92  02/18/22 116/78      Physical Exam Constitutional:      Appearance: Normal appearance. She is normal weight.  HENT:     Head: Normocephalic and atraumatic.     Nose: Nose normal. No congestion or rhinorrhea.     Mouth/Throat:     Mouth: Mucous membranes are moist.      Pharynx: Oropharynx is clear.  Eyes:     Extraocular Movements: Extraocular movements intact.     Conjunctiva/sclera: Conjunctivae normal.     Pupils: Pupils are equal, round, and reactive to light.  Cardiovascular:     Rate and Rhythm: Normal rate and regular rhythm.     Heart sounds: No murmur heard. Pulmonary:     Effort: Pulmonary effort is normal.     Breath sounds: No rhonchi or rales.  Abdominal:     General: Abdomen is flat. Bowel sounds are normal. There is no distension.     Palpations: Abdomen is soft.     Tenderness: There is no abdominal tenderness.  Musculoskeletal:        General: Normal range of motion.     Cervical back: Normal range of motion and neck supple. No rigidity or tenderness.     Right lower leg: No edema.     Left lower leg: No edema.  Lymphadenopathy:     Cervical: No cervical adenopathy.  Skin:    General: Skin is warm and dry.     Capillary Refill: Capillary refill takes less than 2 seconds.  Neurological:     General: No focal deficit present.     Mental Status: She is alert and oriented to person, place, and time.  Psychiatric:  Mood and Affect: Mood normal.        Behavior: Behavior normal.      No results found for any visits on 07/27/22.     The 10-year ASCVD risk score (Arnett DK, et al., 2019) is: 23.4%    Assessment & Plan:   Problem List Items Addressed This Visit       Cardiovascular and Mediastinum   Essential hypertension (Chronic)    BP elevated today. Reports compliance with HCTZ and olmesartan but last fill for these medications was in November. Will refill her medications for with 90 day supply and have her follow up in 4 weeks for BP recheck. Will need BMP at that time.       Relevant Medications   irbesartan (AVAPRO) 300 MG tablet   hydrochlorothiazide (HYDRODIURIL) 12.5 MG tablet     Endocrine   Diabetes - Primary    Need repeat A1c but unfortunately her transportation needed leave. Will check at next  visit.      Relevant Medications   irbesartan (AVAPRO) 300 MG tablet     Genitourinary   UTI (urinary tract infection)    Recent ED visit with flank pain and suprapubic tenderness. Imaging without sings of pyelonephritis. Urine culture with pan sensitive e.coli. Has been on nitrofurantoin. Has 1 dose left. States pain is resolved and feeling much better from this perspective.  Will have her complete course of antibiotics and follow up if symptoms return.         Other   Moderate recurrent major depression (Chronic)    No longer taking her Cymbalta, denies any side effects to the medication. Reports she feels depressed, but "used to the way I feel". Also endorses poor appetitive, difficulty concentrating, and difficult keeping track of conversation. Also having poor sleep, headaches, and poor appetite. She declined PHQ9 today. No SI, HI, or AVD. Suspect depression may be contributing her combination of symptoms. Declined referral to therapy. Will restart cymbalta 30 mg daily.       Relevant Medications   DULoxetine (CYMBALTA) 30 MG capsule   Analgesic overuse headache    Reports daily headaches for the last few weeks. Reports history of migraines for many years. Describes headache as frontal, dull, aching pain. Is able to go to work with the pain. Pain lasts all day without associated aura, photophobia, phonophobia,or nausea/vomiting. Previously pain was 3-4 days a week and improved with tylenol or BC powder.Now feels pain is not improving with tylenol, but BC powder does help. Waking up everyday with a headache. Suspect she has medication overuse headache given history of analgesic use. Recommended she refrain from using BC powder or tylenol to see if this improves frequency of her headaches. If still having frequent headaches may need headache preventative medications.      Relevant Medications   DULoxetine (CYMBALTA) 30 MG capsule    Return in about 4 weeks (around 08/24/2022).     Quincy Simmonds, MD

## 2022-07-28 NOTE — Assessment & Plan Note (Signed)
No longer taking her Cymbalta, denies any side effects to the medication. Reports she feels depressed, but "used to the way I feel". Also endorses poor appetitive, difficulty concentrating, and difficult keeping track of conversation. Also having poor sleep, headaches, and poor appetite. She declined PHQ9 today. No SI, HI, or AVD. Suspect depression may be contributing her combination of symptoms. Declined referral to therapy. Will restart cymbalta 30 mg daily.

## 2022-07-28 NOTE — Assessment & Plan Note (Signed)
Reports daily headaches for the last few weeks. Reports history of migraines for many years. Describes headache as frontal, dull, aching pain. Is able to go to work with the pain. Pain lasts all day without associated aura, photophobia, phonophobia,or nausea/vomiting. Previously pain was 3-4 days a week and improved with tylenol or BC powder.Now feels pain is not improving with tylenol, but BC powder does help. Waking up everyday with a headache. Suspect she has medication overuse headache given history of analgesic use. Recommended she refrain from using BC powder or tylenol to see if this improves frequency of her headaches. If still having frequent headaches may need headache preventative medications.

## 2022-07-28 NOTE — Assessment & Plan Note (Signed)
Recent ED visit with flank pain and suprapubic tenderness. Imaging without sings of pyelonephritis. Urine culture with pan sensitive e.coli. Has been on nitrofurantoin. Has 1 dose left. States pain is resolved and feeling much better from this perspective.  Will have her complete course of antibiotics and follow up if symptoms return.

## 2022-08-05 ENCOUNTER — Encounter: Payer: 59 | Admitting: Student

## 2022-08-10 NOTE — Progress Notes (Signed)
Internal Medicine Clinic Attending  Case discussed with Dr. Liang  at the time of the visit.  We reviewed the resident's history and exam and pertinent patient test results.  I agree with the assessment, diagnosis, and plan of care documented in the resident's note.  

## 2022-08-24 ENCOUNTER — Encounter: Payer: 59 | Admitting: Student

## 2022-11-22 ENCOUNTER — Encounter: Payer: 59 | Admitting: Internal Medicine

## 2023-01-25 ENCOUNTER — Other Ambulatory Visit: Payer: Self-pay

## 2023-01-25 ENCOUNTER — Emergency Department (HOSPITAL_COMMUNITY)
Admission: EM | Admit: 2023-01-25 | Discharge: 2023-01-25 | Disposition: A | Discharge status: Home / Self Care | Payer: 59 | Attending: Emergency Medicine | Admitting: Emergency Medicine

## 2023-01-25 ENCOUNTER — Encounter (HOSPITAL_COMMUNITY): Payer: Self-pay

## 2023-01-25 ENCOUNTER — Emergency Department (HOSPITAL_COMMUNITY): Payer: 59

## 2023-01-25 DIAGNOSIS — Z7982 Long term (current) use of aspirin: Secondary | ICD-10-CM | POA: Diagnosis not present

## 2023-01-25 DIAGNOSIS — G43909 Migraine, unspecified, not intractable, without status migrainosus: Secondary | ICD-10-CM | POA: Insufficient documentation

## 2023-01-25 DIAGNOSIS — I10 Essential (primary) hypertension: Secondary | ICD-10-CM | POA: Diagnosis present

## 2023-01-25 DIAGNOSIS — E876 Hypokalemia: Secondary | ICD-10-CM | POA: Diagnosis not present

## 2023-01-25 DIAGNOSIS — I160 Hypertensive urgency: Secondary | ICD-10-CM | POA: Insufficient documentation

## 2023-01-25 DIAGNOSIS — Z79899 Other long term (current) drug therapy: Secondary | ICD-10-CM | POA: Insufficient documentation

## 2023-01-25 LAB — CBC WITH DIFFERENTIAL/PLATELET
Abs Immature Granulocytes: 0.03 10*3/uL (ref 0.00–0.07)
Basophils Absolute: 0.1 10*3/uL (ref 0.0–0.1)
Basophils Relative: 1 %
Eosinophils Absolute: 0.3 10*3/uL (ref 0.0–0.5)
Eosinophils Relative: 3 %
HCT: 46.4 % — ABNORMAL HIGH (ref 36.0–46.0)
Hemoglobin: 14.7 g/dL (ref 12.0–15.0)
Immature Granulocytes: 0 %
Lymphocytes Relative: 32 %
Lymphs Abs: 2.9 10*3/uL (ref 0.7–4.0)
MCH: 25.8 pg — ABNORMAL LOW (ref 26.0–34.0)
MCHC: 31.7 g/dL (ref 30.0–36.0)
MCV: 81.4 fL (ref 80.0–100.0)
Monocytes Absolute: 0.8 10*3/uL (ref 0.1–1.0)
Monocytes Relative: 9 %
Neutro Abs: 4.9 10*3/uL (ref 1.7–7.7)
Neutrophils Relative %: 55 %
Platelets: 268 10*3/uL (ref 150–400)
RBC: 5.7 MIL/uL — ABNORMAL HIGH (ref 3.87–5.11)
RDW: 15.2 % (ref 11.5–15.5)
WBC: 8.8 10*3/uL (ref 4.0–10.5)
nRBC: 0 % (ref 0.0–0.2)

## 2023-01-25 LAB — BASIC METABOLIC PANEL
Anion gap: 10 (ref 5–15)
BUN: 7 mg/dL (ref 6–20)
CO2: 22 mmol/L (ref 22–32)
Calcium: 9.5 mg/dL (ref 8.9–10.3)
Chloride: 108 mmol/L (ref 98–111)
Creatinine, Ser: 0.61 mg/dL (ref 0.44–1.00)
GFR, Estimated: >60 mL/min (ref >60)
Glucose, Bld: 80 mg/dL (ref 70–99)
Potassium: 3.3 mmol/L — ABNORMAL LOW (ref 3.5–5.1)
Sodium: 140 mmol/L (ref 135–145)

## 2023-01-25 MED ORDER — PROCHLORPERAZINE EDISYLATE 10 MG/2ML IJ SOLN
10.0000 mg | Freq: Once | INTRAMUSCULAR | Status: AC
  Administered 2023-01-25: 10 mg via INTRAVENOUS
  Filled 2023-01-25: qty 2

## 2023-01-25 MED ORDER — POTASSIUM CHLORIDE CRYS ER 20 MEQ PO TBCR
40.0000 meq | EXTENDED_RELEASE_TABLET | Freq: Once | ORAL | Status: AC
  Administered 2023-01-25: 40 meq via ORAL
  Filled 2023-01-25: qty 2

## 2023-01-25 MED ORDER — DIPHENHYDRAMINE HCL 50 MG/ML IJ SOLN
25.0000 mg | Freq: Once | INTRAMUSCULAR | Status: AC
  Administered 2023-01-25: 25 mg via INTRAVENOUS
  Filled 2023-01-25: qty 1

## 2023-01-25 MED ORDER — HYDRALAZINE HCL 20 MG/ML IJ SOLN
10.0000 mg | Freq: Once | INTRAMUSCULAR | Status: AC
  Administered 2023-01-25: 10 mg via INTRAVENOUS
  Filled 2023-01-25: qty 1

## 2023-01-25 NOTE — ED Triage Notes (Signed)
PT [facility] from [facility] for Hypertension, started her morning off with a headache, patient states she is feeling weird or odd, cannot pinpoint a certain issue, states she has had some right arm coordination issues, but this isn't new and primary doctor is aware and arranged a follow up appointment with a specialist, but she hasn't went, and hasn't taken her blood pressure medication this morning.   192/110 HR 78 100% RA CBG 101 12 Lead - SR

## 2023-01-25 NOTE — Discharge Instructions (Signed)
Follow-up with your primary care provider for further management of your blood pressure.  Continue taking your home medications.

## 2023-01-25 NOTE — ED Notes (Signed)
Ptar called eta not given but stated 4 other patients ahead

## 2023-01-25 NOTE — ED Provider Notes (Signed)
[place] EMERGENCY DEPARTMENT AT [facility] Provider Note   CSN: [id number] Arrival date & time: 01/25/23  1040     History  Chief Complaint  Patient presents with   Hypertension    Leah Rose is a 52 y.o. female.  52 year old female presents today for concern of elevated blood pressure.  She reports compliance with her home medications but is unable to tell me what her home medications are.  She follows with the internal medicine clinic but has not seen them since April.  She denies chest pain, shortness of breath.  She states she had a period of instability on Monday but since then has not had any issue.  She also states that she has dizziness but when asked to describe she describes lightheadedness with position change.  She states her barrier to medication compliance is understanding her medicines and when to take them.  She also reports almost daily headaches but she does have a history of migraines and this is also consistent with her previous internal medicine know where she had daily headaches.  She states her blood pressure has been uncontrolled for "years".  The history is provided by the patient. No language interpreter was used.       Home Medications Prior to Admission medications   Medication Sig Start Date End Date Taking? Authorizing Provider  aspirin EC 81 MG tablet Take 81 mg by mouth daily. Swallow whole.    [provider]  DULoxetine (CYMBALTA) 30 MG capsule Take 1 capsule (30 mg total) by mouth daily. 07/27/22 07/22/23  Quincy Simmonds, MD  hydrochlorothiazide (HYDRODIURIL) 12.5 MG tablet Take 1 tablet (12.5 mg total) by mouth daily. 07/27/22 07/22/23  Quincy Simmonds, MD  irbesartan (AVAPRO) 300 MG tablet Take 1 tablet (300 mg total) by mouth daily. 07/27/22 07/22/23  Quincy Simmonds, MD  medroxyPROGESTERone (DEPO-PROVERA) 150 MG/ML injection Inject 150 mg into the muscle every 3 (three) months. 11/18/21   [provider]  Nutritional  Supplements (ENSURE COMPLETE SHAKE) LIQD Take 1 each by mouth 3 (three) times daily as needed. Patient not taking: Reported on 07/22/2022 02/18/22   Rocky Morel, DO  polyethylene glycol (MIRALAX / GLYCOLAX) 17 g packet Take 17 g by mouth daily. Patient not taking: Reported on 07/22/2022 02/19/22   Rocky Morel, DO  rosuvastatin (CRESTOR) 20 MG tablet Take 1 tablet by mouth once daily 07/28/22   Dickie La, MD      Allergies    Ibuprofen, Keflex [cephalexin], Penicillins, and Sulfonamide derivatives    Review of Systems   Review of Systems  Constitutional:  Negative for chills and fever.  Eyes:  Negative for visual disturbance.  Respiratory:  Negative for shortness of breath.   Cardiovascular:  Negative for chest pain.  Neurological:  Positive for light-headedness and headaches. Negative for weakness.  All other systems reviewed and are negative.   Physical Exam Updated Vital Signs BP (!) 188/107   Pulse 78   Temp 98.6 F (37 C) (Oral)   Resp 18   SpO2 100%  Physical Exam Vitals and nursing note reviewed.  Constitutional:      General: She is not in acute distress.    Appearance: Normal appearance. She is not ill-appearing.  HENT:     Head: Normocephalic and atraumatic.     Nose: Nose normal.  Eyes:     General: No scleral icterus.    Extraocular Movements: Extraocular movements intact.     Conjunctiva/sclera: Conjunctivae normal.  Cardiovascular:  Rate and Rhythm: Normal rate and regular rhythm.     Heart sounds: Normal heart sounds.  Pulmonary:     Effort: Pulmonary effort is normal. No respiratory distress.     Breath sounds: Normal breath sounds. No wheezing or rales.  Abdominal:     General: There is no distension.     Tenderness: There is no abdominal tenderness.  Musculoskeletal:        General: Normal range of motion.     Cervical back: Normal range of motion.  Skin:    General: Skin is warm and dry.  Neurological:     General: No focal deficit  present.     Mental Status: She is alert and oriented to person, place, and time. Mental status is at baseline.     Cranial Nerves: No cranial nerve deficit.     Motor: No weakness.     Comments: Cranial nerves III through XII intact.  Full range of motion of bilateral upper and lower extremities with 5/5 strength in extensor and flexor muscle groups.  Without facial droop.  Without pronator drift.  Normal speech.     ED Results / Procedures / Treatments   Labs (all labs ordered are listed, but only abnormal results are displayed) Labs Reviewed  CBC WITH DIFFERENTIAL/PLATELET  BASIC METABOLIC PANEL    EKG None  Radiology No results found.  Procedures Procedures    Medications Ordered in ED Medications  hydrALAZINE (APRESOLINE) injection 10 mg (10 mg Intravenous Given 01/25/23 1303)  prochlorperazine (COMPAZINE) injection 10 mg (10 mg Intravenous Given 01/25/23 1303)  diphenhydrAMINE (BENADRYL) injection 25 mg (25 mg Intravenous Given 01/25/23 1303)    ED Course/ Medical Decision Making/ A&P                                 Medical Decision Making Amount and/or Complexity of Data Reviewed Labs: ordered. Radiology: ordered.  Risk Prescription drug management.   Medical Decision Making / ED Course   This patient presents to the ED for concern of hypertension, this involves an extensive number of treatment options, and is a complaint that carries with it a high risk of complications and morbidity.  The differential diagnosis includes hypertensive urgency, hypertensive emergency, uncontrolled hypertension  MDM: 52 year old female presents today for concern of uncontrolled blood pressure in the setting of headache and instability.  Only had 1 episode of instability.  Which was Monday.  Headaches were chronic and she has history of migraine headache.  Do not feel this is related to her elevated blood pressure.  Otherwise her dizziness or positional change and described as  lightheadedness.  Will obtain CT head and basic blood work.  Will give her dose of hydralazine.  She took her home medications today.  Otherwise she is without chest pain or shortness of breath.  Neurological exam without any focal deficits.  CBC without leukocytosis or anemia.  BMP with hypokalemia 3.3.  Repletion ordered.  Preserved renal function.  Patient stable for discharge.  Discharged in stable condition.  Improved after migraine cocktail.  Blood pressure improved.  Lab Tests: -I ordered, reviewed, and interpreted labs.   The pertinent results include:   Labs Reviewed  CBC WITH DIFFERENTIAL/PLATELET  BASIC METABOLIC PANEL      EKG  EKG Interpretation Date/Time:    Ventricular Rate:    PR Interval:    QRS Duration:    QT Interval:    QTC  Calculation:   R Axis:      Text Interpretation:          Medicines ordered and prescription drug management: Meds ordered this encounter  Medications   hydrALAZINE (APRESOLINE) injection 10 mg   prochlorperazine (COMPAZINE) injection 10 mg   diphenhydrAMINE (BENADRYL) injection 25 mg    -I have reviewed the patients home medicines and have made adjustments as needed   Reevaluation: After the interventions noted above, I reevaluated the patient and found that they have :improved  Co morbidities that complicate the patient evaluation  Past Medical History:  Diagnosis Date   Chronic venous insufficiency 06/23/2017   Essential hypertension 03/14/2006   Hypertensive emergency 07/15/2020   Hypokalemia 07/15/2020   Moderate recurrent major depression (HCC) 03/14/2006   Tobacco abuse 03/14/2006      Dispostion: Patient discharged in stable condition.  Return precaution discussed.  Patient voices understanding and is in agreement with plan.  Final Clinical Impression(s) / ED Diagnoses Final diagnoses:  Hypertensive urgency  Migraine without status migrainosus, not intractable, unspecified migraine type    Rx / DC  Orders ED Discharge Orders     None         Marita [place], PA-C 01/25/23 1519    Gerhard Munch, MD 01/26/23 2025

## 2023-01-25 NOTE — ED Notes (Signed)
Leah Rose, [place] notified that patient was a difficult IV stick and that IV team was unable to to collect blood specimens.

## 2023-03-14 ENCOUNTER — Encounter: Payer: 59 | Admitting: Internal Medicine

## 2023-06-26 ENCOUNTER — Ambulatory Visit: Payer: Self-pay

## 2023-06-26 NOTE — Telephone Encounter (Signed)
 Chief Complaint: HA Symptoms: HA, increased agitation, fatigue Frequency: Ongoing x 1 month Pertinent Negatives: Patient denies CP, SOB Disposition: [] ED /[] Urgent Care (no appt availability in office) / [x] Appointment(In office/virtual)/ []  [place] [facility]/ [] Home Care/ [] Refused Recommended Disposition /[] [place] [facility]/ []  Follow-up with PCP Additional Notes: Pt reports she has been experiencing ongoing headaches for about 1 month. They are intermittent, pt denies vision changes, CP, SOB, numbness or weakness. Pt notes she feels more irritable lately and the HA's often come when she is frustrated. Pt reports she does have a BP cuff at home however she has not checked it recently. OV offered tomorrow AM, pt declines due to work, scheduled 03/26. This RN educated pt on home care, new-worsening symptoms, when to call back/seek emergent care. Pt verbalized understanding and agrees to plan.    Copied from CRM [id number]. Topic: Appointments - Appointment Scheduling >> Jun 26, 2023  1:36 PM Tiffany H wrote: Patient/patient representative is calling to schedule an appointment. Refer to attachments for appointment information.   Patient advised that she's been having blood pressure-related headaches, her ankles are swollen, she's very fatigued and agitated. Patient advised that her anxiety/anxiousness is at its "last stage".   Please assist. Reason for Disposition  Headache is a chronic symptom (recurrent or ongoing AND present > 4 weeks)  Answer Assessment - Initial Assessment Questions 2. ONSET: "When did the headache start?" (Minutes, hours or days)      Today 3. PATTERN: "Does the pain come and go, or has it been constant since it started?"     Comes and goes 4. SEVERITY: "How bad is the pain?" and "What does it keep you from doing?"  (e.g., Scale 1-10; mild, moderate, or severe)   - MILD (1-3): doesn't interfere with normal activities    - MODERATE (4-7): interferes with  normal activities or awakens from sleep    - SEVERE (8-10): excruciating pain, unable to do any normal activities        7/10 5. RECURRENT SYMPTOM: "Have you ever had headaches before?" If Yes, ask: "When was the last time?" and "What happened that time?"      Yes 6. CAUSE: "What do you think is causing the headache?"     Pt believes related to BP  9. OTHER SYMPTOMS: "Do you have any other symptoms?" (fever, stiff neck, eye pain, sore throat, cold symptoms)     Bilateral LE swelling  Protocols used: Headache-A-AH

## 2023-06-28 ENCOUNTER — Ambulatory Visit: Admitting: Student

## 2023-06-28 VITALS — BP 148/97 | HR 92 | Wt 106.1 lb

## 2023-06-28 DIAGNOSIS — E782 Mixed hyperlipidemia: Secondary | ICD-10-CM

## 2023-06-28 DIAGNOSIS — E785 Hyperlipidemia, unspecified: Secondary | ICD-10-CM

## 2023-06-28 DIAGNOSIS — E119 Type 2 diabetes mellitus without complications: Secondary | ICD-10-CM

## 2023-06-28 DIAGNOSIS — Z720 Tobacco use: Secondary | ICD-10-CM

## 2023-06-28 DIAGNOSIS — I10 Essential (primary) hypertension: Secondary | ICD-10-CM

## 2023-06-28 DIAGNOSIS — Z0000 Encounter for general adult medical examination without abnormal findings: Secondary | ICD-10-CM

## 2023-06-28 DIAGNOSIS — F17210 Nicotine dependence, cigarettes, uncomplicated: Secondary | ICD-10-CM | POA: Diagnosis not present

## 2023-06-28 DIAGNOSIS — Z780 Asymptomatic menopausal state: Secondary | ICD-10-CM

## 2023-06-28 LAB — POCT GLYCOSYLATED HEMOGLOBIN (HGB A1C): Hemoglobin A1C: 5.6 % (ref 4.0–5.6)

## 2023-06-28 LAB — GLUCOSE, CAPILLARY: Glucose-Capillary: 93 mg/dL (ref 70–99)

## 2023-06-28 MED ORDER — NICOTINE 7 MG/24HR TD PT24
7.0000 mg | MEDICATED_PATCH | Freq: Every day | TRANSDERMAL | 0 refills | Status: AC
Start: 2023-06-28 — End: ?

## 2023-06-28 MED ORDER — PAROXETINE HCL 10 MG PO TABS
10.0000 mg | ORAL_TABLET | Freq: Every day | ORAL | 2 refills | Status: AC
Start: 2023-06-28 — End: 2024-06-27

## 2023-06-28 MED ORDER — IRBESARTAN 300 MG PO TABS: 300.0000 mg | ORAL_TABLET | Freq: Every day | ORAL | 3 refills | Status: DC

## 2023-06-28 MED ORDER — IRBESARTAN 150 MG PO TABS
150.0000 mg | ORAL_TABLET | Freq: Every day | ORAL | 11 refills | Status: AC
Start: 2023-06-28 — End: 2024-06-27

## 2023-06-28 MED ORDER — HYDROCHLOROTHIAZIDE 12.5 MG PO TABS
12.5000 mg | ORAL_TABLET | Freq: Every day | ORAL | 3 refills | Status: AC
Start: 2023-06-28 — End: 2024-06-22

## 2023-06-28 NOTE — Patient Instructions (Addendum)
 Thank you so much for coming to the clinic today!   For your blood pressure, please take the hydrochlorothiazide and the new medication called irbesartan.   For the irritability, I've started you on a very low dose medication called paroxetine.   We are rechecking your cholesterol and your kidney function today.   If you have any questions please feel free to the call the clinic at anytime at [phone]. It was a pleasure seeing you!  Best, Dr. Thomasene Ripple

## 2023-06-28 NOTE — Assessment & Plan Note (Signed)
 Patient states that she has been having intermittent hot flashes, as well as increasing irritability since her menopause started couple years ago.  Her last period was over 10 years ago.  She says the slightest things irritate her, and is gone to the point where her sister even commented that she was "boiling".  I do not think that she is a candidate for estrogen therapy given that she is currently smoking cigarettes, so we will start an SSRI (paroxetine) at very low dose.  Informed patient of the side effects, and she is agreeable.  Plan: - Start paroxetine 10 mg

## 2023-06-28 NOTE — Assessment & Plan Note (Signed)
 Rechecking lipid panel today, she has not had one in 2 years.  She is on rosuvastatin 20 mg.

## 2023-06-28 NOTE — Progress Notes (Signed)
 CC: HTN  HPI:  [name] is a 53 y.o. female living with a history stated below and presents today for blood pressure follow-up. Please see problem based assessment and plan for additional details.  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 Swallow whole.     medroxyPROGESTERone (DEPO-PROVERA) 150 MG/ML injection Inject 150 mg into the muscle every 3 (three) months.     Nutritional Supplements (ENSURE COMPLETE SHAKE) LIQD Take 1 each by mouth 3 (three) times daily as needed. (Patient not taking: Reported on 07/22/2022) 237 mL 99   polyethylene glycol (MIRALAX / GLYCOLAX) 17 g packet Take 17 g by mouth daily. (Patient not taking: Reported on 07/22/2022) 14 each 0   rosuvastatin (CRESTOR) 20 MG tablet Take 1 tablet by mouth once daily 30 tablet 11   No current facility-administered medications on file prior to visit.    Family History  Problem Relation Age of Onset   Seizures Mother    Cirrhosis Mother    Birth defects Sister    Hypertension Sister    Breast cancer Maternal Grandmother    Thyroid disease Sister     Social History   Socioeconomic History   Marital status: Single    Spouse name: Not on file   Number of children: 1   Years of education: 12   Highest education level: Not on file  Occupational History   Occupation: Chief Operating Officer    Comment: Production manager [employer]  Tobacco Use   Smoking status: Every Day    Current packs/day: 0.50    Types: Cigarettes   Smokeless tobacco: Never   Tobacco comments:    less sometimes.   Vaping Use   Vaping status: Never  Used  Substance and Sexual Activity   Alcohol use: Yes    Alcohol/week: 14.0 standard drinks of alcohol    Types: 14 Cans of beer per week   Drug use: No   Sexual activity: Not on file  Other Topics Concern   Not on file  Social History Narrative   Lives with her "old man" and a cat in [place]   Social Drivers of Health   Financial Resource Strain: Not on file  Food Insecurity: No Food Insecurity (02/18/2022)   Hunger Vital Sign    Worried About Running Out of Food in the Last Year: Never true    Ran Out of Food in the Last Year: Never true  Transportation Needs: Unmet Transportation Needs (02/18/2022)   PRAPARE - Administrator, Civil Service (Medical): No    Lack of Transportation (Non-Medical): Yes  Physical Activity: Not on file  Stress: Not on file  Social Connections: Not on file  Intimate Partner Violence: Not At Risk (02/18/2022)   Humiliation, Afraid, Rape, and Kick questionnaire    Fear of Current or Ex-Partner: No    Emotionally Abused: No    Physically Abused: No    Sexually Abused: No    Review of Systems: ROS negative except for what is noted on the assessment and plan.  Vitals:   06/28/23 0842 06/28/23 0913  BP: (!) 152/99 (!) 148/97  Pulse: (!) 106 92  Weight: 106 lb 1.6 oz (48.1 kg)     Physical Exam: Constitutional: well-appearing female in no acute distress Cardiovascular: regular rate and rhythm, no m/r/g Pulmonary/Chest: normal work of breathing on room air, lungs clear to auscultation bilaterally Abdominal: soft, non-tender, non-distended Neurological: alert & oriented x 3, 5/5 strength in bilateral upper and lower extremities, normal gait Skin: warm and dry Psych: Normal mood and affect  Assessment & Plan:   Essential hypertension Patient presents for follow-up regarding her blood pressure.  Her regimen is hydrochlorothiazide 12.5 and irbesartan 300 mg.  Looking at fill history, last time she picked up the irbesartan was in  April 2024, and the hydrochlorothiazide in October 2024, both 90-day supplies.  Blood pressure today is 152/99, on repeat is 148/97.  She is only taking hydrochlorothiazide, not the irbesartan.  She is currently smoking, about 2 to 3 cigarettes a day.  I think restarting her on irbesartan for now will be in her best interest, will follow-up with her in about a month.    I do wonder if there is an element of secondary hypertension, as her only risk factor for high blood pressure is smoking cigarettes.  Her BMI is 21.43.  Will see how well her blood pressure is better controlled with dual agents consistently, and if not controlled can consider secondary hypertension workup.  Plan:  -Restart irbesartan at 150 mg - Continue hydrochlorothiazide 12.5 mg - Check BMP today  Diabetes (HCC) Last A1c checked 2 years ago was 5.7, rechecking today is 5.6, will resolve this problem.  Hyperlipidemia Rechecking lipid panel today, she has not had one in 2 years.  She is on rosuvastatin 20 mg.  Menopause Patient states that she has been having intermittent hot flashes, as well as increasing irritability since her menopause started couple years ago.  Her last period was over 10 years ago.  She says the slightest things irritate her, and is gone to the point where her sister even commented that she was "boiling".  I do not think that she is a candidate for estrogen therapy given that she is currently smoking cigarettes, so we will start an SSRI (paroxetine) at very low dose.  Informed patient of the side effects, and she is agreeable.  Plan: - Start paroxetine 10 mg  Tobacco abuse She is still smoking about 2 to 3 cigarettes a day, she is interested in quitting.  Discussed use of nicotine patches, which she is agreeable to.  Also brought up the idea of nicotine gum, however patient would like to try the patches first instead of using both simultaneously.  If no decrease in cigarette use at next visit, can consider  addition of nicotine gum.   Patient discussed with Dr. Lennon Alstrom Dreshaun Stene, M.D. [facility] Internal Medicine, PGY-2 Pager: [phone] Date 06/28/2023 Time 10:23 AM

## 2023-06-28 NOTE — Assessment & Plan Note (Addendum)
 Last A1c checked 2 years ago was 5.7, rechecking today is 5.6, will resolve this problem.

## 2023-06-28 NOTE — Assessment & Plan Note (Addendum)
 Patient presents for follow-up regarding her blood pressure.  Her regimen is hydrochlorothiazide 12.5 and irbesartan 300 mg.  Looking at fill history, last time she picked up the irbesartan was in April 2024, and the hydrochlorothiazide in October 2024, both 90-day supplies.  Blood pressure today is 152/99, on repeat is 148/97.  She is only taking hydrochlorothiazide, not the irbesartan.  She is currently smoking, about 2 to 3 cigarettes a day.  I think restarting her on irbesartan for now will be in her best interest, will follow-up with her in about a month.    I do wonder if there is an element of secondary hypertension, as her only risk factor for high blood pressure is smoking cigarettes.  Her BMI is 21.43.  Will see how well her blood pressure is better controlled with dual agents consistently, and if not controlled can consider secondary hypertension workup.  Plan:  -Restart irbesartan at 150 mg - Continue hydrochlorothiazide 12.5 mg - Check BMP today

## 2023-06-28 NOTE — Assessment & Plan Note (Signed)
 She is still smoking about 2 to 3 cigarettes a day, she is interested in quitting.  Discussed use of nicotine patches, which she is agreeable to.  Also brought up the idea of nicotine gum, however patient would like to try the patches first instead of using both simultaneously.  If no decrease in cigarette use at next visit, can consider addition of nicotine gum.

## 2023-06-29 LAB — BMP8+ANION GAP
Anion Gap: 15 mmol/L (ref 10.0–18.0)
BUN/Creatinine Ratio: 11 (ref 9–23)
BUN: 7 mg/dL (ref 6–24)
CO2: 21 mmol/L (ref 20–29)
Calcium: 9.3 mg/dL (ref 8.7–10.2)
Chloride: 107 mmol/L — ABNORMAL HIGH (ref 96–106)
Creatinine, Ser: 0.64 mg/dL (ref 0.57–1.00)
Glucose: 87 mg/dL (ref 70–99)
Potassium: 4.1 mmol/L (ref 3.5–5.2)
Sodium: 143 mmol/L (ref 134–144)
eGFR: 106 mL/min/{1.73_m2} (ref >59)

## 2023-06-29 LAB — LIPID PANEL
Chol/HDL Ratio: 3.3 ratio (ref 0.0–4.4)
Cholesterol, Total: 141 mg/dL (ref 100–199)
HDL: 43 mg/dL (ref >39)
LDL Chol Calc (NIH): 83 mg/dL (ref 0–99)
Triglycerides: 78 mg/dL (ref 0–149)
VLDL Cholesterol Cal: 15 mg/dL (ref 5–40)

## 2023-06-29 NOTE — Addendum Note (Signed)
 Addended by: Dickie La on: 06/29/2023 10:44 AM   Modules accepted: Level of Service

## 2023-06-29 NOTE — Progress Notes (Signed)
 Internal Medicine Clinic Attending  Case discussed with the resident at the time of the visit.  We reviewed the resident's history and exam and pertinent patient test results.  I agree with the assessment, diagnosis, and plan of care documented in the resident's note.

## 2023-07-11 ENCOUNTER — Telehealth: Payer: Self-pay | Admitting: *Deleted

## 2023-07-11 NOTE — Telephone Encounter (Signed)
 Lvm for patient to return regarding mammogram scheduling.

## 2023-07-28 ENCOUNTER — Encounter: Admitting: Student

## 2023-08-22 ENCOUNTER — Ambulatory Visit: Payer: Self-pay | Admitting: Internal Medicine

## 2023-08-22 ENCOUNTER — Telehealth: Payer: Self-pay | Admitting: Internal Medicine

## 2023-08-22 NOTE — Telephone Encounter (Signed)
 Patient was contacted via telephone due to no showing her appointment this morning.  No answer, but was able to leave detail message asking to call our office back to reschedule.

## 2023-08-22 NOTE — Progress Notes (Deleted)
   CC: HTN and menopausal symptom follow up  HPI:  [name] is a 53 y.o. with medical history of HTN, HLD, DMII and tobacco use disorder presenting to [facility] for follow up for her HTN and symptoms of hot flashes. She was last seen 2 months ago and started on a second antihypertensive agent and paroxetine  and instructed to follow up in one month.   Please see problem-based list for further details, assessments, and plans.  Past Medical History:  Diagnosis Date   Chronic venous insufficiency 06/23/2017   Essential hypertension 03/14/2006   Hypertensive emergency 07/15/2020   Hypokalemia 07/15/2020   Moderate recurrent major depression (HCC) 03/14/2006   Tobacco abuse 03/14/2006    Current Outpatient Medications (Endocrine & Metabolic):    medroxyPROGESTERone (DEPO-PROVERA) 150 MG/ML injection, Inject 150 mg into the muscle every 3 (three) months.  Current Outpatient Medications (Cardiovascular):    hydrochlorothiazide  (HYDRODIURIL ) 12.5 MG tablet, Take 1 tablet (12.5 mg total) by mouth daily.   irbesartan  (AVAPRO ) 150 MG tablet, Take 1 tablet (150 mg total) by mouth daily.   rosuvastatin  (CRESTOR ) 20 MG tablet, Take 1 tablet by mouth once daily   Current Outpatient Medications (Analgesics):    aspirin  EC 81 MG tablet, Take 81 mg by mouth daily. Swallow whole.   Current Outpatient Medications (Other):    nicotine  (NICODERM CQ  - DOSED IN MG/24 HR) 7 mg/24hr patch, Place 1 patch (7 mg total) onto the skin daily.   Nutritional Supplements (ENSURE COMPLETE SHAKE) LIQD, Take 1 each by mouth 3 (three) times daily as needed. (Patient not taking: Reported on 07/22/2022)   PARoxetine  (PAXIL ) 10 MG tablet, Take 1 tablet (10 mg total) by mouth daily.   polyethylene glycol (MIRALAX  / GLYCOLAX ) 17 g packet, Take 17 g by mouth daily. (Patient not taking: Reported on 07/22/2022)  Review of Systems:  Review of system negative unless stated in the problem list or HPI.    Physical  Exam:  There were no vitals filed for this visit. Physical Exam General: NAD HENT: NCAT Lungs: CTAB, no wheeze, rhonchi or rales.  Cardiovascular: Normal heart sounds, no r/m/g, 2+ pulses in all extremities. No LE edema Abdomen: No TTP, normal bowel sounds MSK: No asymmetry or muscle atrophy.  Skin: no lesions noted on exposed skin Neuro: Alert and oriented x4. CN grossly intact Psych: Normal mood and normal affect   Assessment & Plan:   No problem-specific Assessment & Plan notes found for this encounter.   See Encounters Tab for problem based charting.  Patient Discussed with Dr. {NAMES:[id number]::"Guilloud","Hoffman","Mullen","Narendra","Vincent","Machen","Lau","Hatcher","Williams"} Jackolyn Masker, MD Tommas Fragmin. [facility] Internal Medicine Residency, PGY-3

## 2023-10-25 ENCOUNTER — Encounter: Payer: Self-pay | Admitting: Internal Medicine
